# Patient Record
Sex: Female | Born: 1945 | Race: White | Hispanic: No | Marital: Married | Smoking: Never smoker
Health system: Southern US, Community
[De-identification: ages and names within clinical notes are randomized; demographics above are authoritative.]

## PROBLEM LIST (undated history)

## (undated) DIAGNOSIS — R739 Hyperglycemia, unspecified: Secondary | ICD-10-CM

## (undated) DIAGNOSIS — Z8619 Personal history of other infectious and parasitic diseases: Secondary | ICD-10-CM

## (undated) DIAGNOSIS — Z86010 Personal history of colonic polyps: Secondary | ICD-10-CM

## (undated) DIAGNOSIS — K219 Gastro-esophageal reflux disease without esophagitis: Secondary | ICD-10-CM

## (undated) DIAGNOSIS — B079 Viral wart, unspecified: Secondary | ICD-10-CM

## (undated) DIAGNOSIS — Z0000 Encounter for general adult medical examination without abnormal findings: Secondary | ICD-10-CM

## (undated) DIAGNOSIS — T7840XA Allergy, unspecified, initial encounter: Secondary | ICD-10-CM

## (undated) DIAGNOSIS — I10 Essential (primary) hypertension: Secondary | ICD-10-CM

## (undated) DIAGNOSIS — E785 Hyperlipidemia, unspecified: Secondary | ICD-10-CM

## (undated) HISTORY — DX: Hyperlipidemia, unspecified: E78.5

## (undated) HISTORY — DX: Hyperglycemia, unspecified: R73.9

## (undated) HISTORY — DX: Essential (primary) hypertension: I10

## (undated) HISTORY — DX: Viral wart, unspecified: B07.9

## (undated) HISTORY — DX: Encounter for general adult medical examination without abnormal findings: Z00.00

## (undated) HISTORY — DX: Personal history of other infectious and parasitic diseases: Z86.19

## (undated) HISTORY — DX: Personal history of colonic polyps: Z86.010

## (undated) HISTORY — DX: Allergy, unspecified, initial encounter: T78.40XA

## (undated) HISTORY — DX: Gastro-esophageal reflux disease without esophagitis: K21.9

---

## 2007-09-01 DIAGNOSIS — Z860100 Personal history of colon polyps, unspecified: Secondary | ICD-10-CM

## 2007-09-01 HISTORY — DX: Personal history of colonic polyps: Z86.010

## 2007-09-01 HISTORY — DX: Personal history of colon polyps, unspecified: Z86.0100

## 2007-09-01 LAB — HM COLONOSCOPY

## 2008-12-19 LAB — HM COLONOSCOPY

## 2011-06-01 LAB — HM DEXA SCAN

## 2011-09-03 ENCOUNTER — Telehealth: Payer: Self-pay | Admitting: Internal Medicine

## 2011-09-03 ENCOUNTER — Ambulatory Visit (INDEPENDENT_AMBULATORY_CARE_PROVIDER_SITE_OTHER): Payer: 59 | Admitting: Internal Medicine

## 2011-09-03 ENCOUNTER — Encounter: Payer: Self-pay | Admitting: Internal Medicine

## 2011-09-03 DIAGNOSIS — Z23 Encounter for immunization: Secondary | ICD-10-CM

## 2011-09-03 DIAGNOSIS — K635 Polyp of colon: Secondary | ICD-10-CM

## 2011-09-03 DIAGNOSIS — D126 Benign neoplasm of colon, unspecified: Secondary | ICD-10-CM

## 2011-09-03 DIAGNOSIS — Z79899 Other long term (current) drug therapy: Secondary | ICD-10-CM

## 2011-09-03 DIAGNOSIS — I10 Essential (primary) hypertension: Secondary | ICD-10-CM

## 2011-09-03 DIAGNOSIS — M810 Age-related osteoporosis without current pathological fracture: Secondary | ICD-10-CM

## 2011-09-03 DIAGNOSIS — Z1239 Encounter for other screening for malignant neoplasm of breast: Secondary | ICD-10-CM

## 2011-09-03 DIAGNOSIS — E785 Hyperlipidemia, unspecified: Secondary | ICD-10-CM

## 2011-09-03 NOTE — Telephone Encounter (Signed)
Patient has cpe on 12/02/11. Please order labs for one week prior. Patient will be going to [facility] lab

## 2011-09-03 NOTE — Patient Instructions (Signed)
Please sign a record release for your last bone density and other records Schedule your mammogram at your convenience. A computer order has been started. Please schedule labs prior to your next visit (cbc-401.9, chem7-v58.69, lipid/lft-272.4)

## 2011-09-04 NOTE — Telephone Encounter (Signed)
Lab orders entered for April 2013. 

## 2011-09-06 DIAGNOSIS — E785 Hyperlipidemia, unspecified: Secondary | ICD-10-CM | POA: Insufficient documentation

## 2011-09-06 DIAGNOSIS — Z1239 Encounter for other screening for malignant neoplasm of breast: Secondary | ICD-10-CM | POA: Insufficient documentation

## 2011-09-06 DIAGNOSIS — M810 Age-related osteoporosis without current pathological fracture: Secondary | ICD-10-CM | POA: Insufficient documentation

## 2011-09-06 DIAGNOSIS — K635 Polyp of colon: Secondary | ICD-10-CM | POA: Insufficient documentation

## 2011-09-06 DIAGNOSIS — I10 Essential (primary) hypertension: Secondary | ICD-10-CM | POA: Insufficient documentation

## 2011-09-06 NOTE — Assessment & Plan Note (Signed)
Record release for last bmd to review. ? Consider reclast

## 2011-09-06 NOTE — Assessment & Plan Note (Signed)
Schedule screening mammogram. Pap smear with next visit

## 2011-09-06 NOTE — Assessment & Plan Note (Signed)
Defers colonoscopy currently

## 2011-09-06 NOTE — Progress Notes (Signed)
  Subjective:    Patient ID: Michelle Jacobs, female    DOB: 1946-02-04, 66 y.o.   MRN: [id number]  HPI Pt presents to clinic to establish care and follow up of multiple medical problems. States was scheduled for f/u colonoscopy 8/12, underwent prep however procedure was canceled as she was undergoing evaluation for abn ekg. Has not rescheduled colonoscopy and states will wait one year before proceeding. Cardiology evaluation was unremarkable with reportedly neg stress test. H/o hyperlipidemia taking niacin without flushing. BP well controlled with ace inhibitor without cough. Believes underwent bmd 2012 ?osteoporosis despite 2 years of fosamax. No recent fx. Last mammogram and pap smear 4-5 years ago. No other complaints.  Past Medical History  Diagnosis Date  . History of chicken pox   . GERD (gastroesophageal reflux disease)     mild  . Allergy   . Hypertension   . Hyperlipidemia   . Personal history of colonic polyps 2009    Benign colon polyps   History reviewed. No pertinent past surgical history.  reports that she has never smoked. She has never used smokeless tobacco. She reports that she drinks about 6 ounces of alcohol per week. She reports that she does not use illicit drugs. family history includes Cancer in her maternal grandmother and mother; Diabetes in her maternal uncle; Heart disease in her father; and Hypertension in her mother. Allergies  Allergen Reactions  . Iodine Rash  . Sulfa Antibiotics Rash     Review of Systems  Respiratory: Negative for cough and shortness of breath.   Cardiovascular: Negative for chest pain.  All other systems reviewed and are negative.       Objective:   Physical Exam   Physical Exam  Nursing note and vitals reviewed. Constitutional: Appears well-developed and well-nourished. No distress.  HENT:  Head: Normocephalic and atraumatic.  Right Ear: External ear normal.  Left Ear: External ear normal.  Eyes: Conjunctivae are  normal. No scleral icterus.  Neck: Neck supple. Carotid bruit is not present.  Cardiovascular: Normal rate, regular rhythm and normal heart sounds.  Exam reveals no gallop and no friction rub.   No murmur heard. Pulmonary/Chest: Effort normal and breath sounds normal. No respiratory distress. He has no wheezes. no rales.  Lymphadenopathy:    He has no cervical adenopathy.  Neurological:Alert.  Skin: Skin is warm and dry. Not diaphoretic.  Psychiatric: Has a normal mood and affect.       Assessment & Plan:

## 2011-09-06 NOTE — Assessment & Plan Note (Signed)
Normotensive and stable. Continue current regimen. Monitor bp as outpt and followup in clinic as scheduled.  

## 2011-10-12 ENCOUNTER — Telehealth: Payer: Self-pay | Admitting: Internal Medicine

## 2011-10-12 MED ORDER — ALENDRONATE SODIUM 70 MG PO TABS: 70.0000 mg | ORAL_TABLET | ORAL | Status: DC

## 2011-10-12 NOTE — Telephone Encounter (Signed)
[facility]

## 2011-10-12 NOTE — Telephone Encounter (Signed)
Rx refill sent to pharmacy. 

## 2011-12-02 ENCOUNTER — Other Ambulatory Visit (HOSPITAL_COMMUNITY)
Admission: RE | Admit: 2011-12-02 | Discharge: 2011-12-02 | Disposition: A | Discharge status: Home / Self Care | Payer: 59 | Source: Ambulatory Visit | Attending: Internal Medicine | Admitting: Internal Medicine

## 2011-12-02 ENCOUNTER — Telehealth: Payer: Self-pay | Admitting: Internal Medicine

## 2011-12-02 ENCOUNTER — Encounter: Payer: Self-pay | Admitting: Internal Medicine

## 2011-12-02 ENCOUNTER — Ambulatory Visit (HOSPITAL_BASED_OUTPATIENT_CLINIC_OR_DEPARTMENT_OTHER)
Admission: RE | Admit: 2011-12-02 | Discharge: 2011-12-02 | Disposition: A | Discharge status: Home / Self Care | Payer: 59 | Source: Ambulatory Visit | Attending: Internal Medicine | Admitting: Internal Medicine

## 2011-12-02 ENCOUNTER — Ambulatory Visit (INDEPENDENT_AMBULATORY_CARE_PROVIDER_SITE_OTHER): Payer: 59 | Admitting: Internal Medicine

## 2011-12-02 VITALS — BP 114/72 | HR 91 | Temp 97.9°F | Resp 18 | Ht 61.5 in | Wt 152.0 lb

## 2011-12-02 DIAGNOSIS — E785 Hyperlipidemia, unspecified: Secondary | ICD-10-CM

## 2011-12-02 DIAGNOSIS — Z0000 Encounter for general adult medical examination without abnormal findings: Secondary | ICD-10-CM

## 2011-12-02 DIAGNOSIS — Z1231 Encounter for screening mammogram for malignant neoplasm of breast: Secondary | ICD-10-CM | POA: Insufficient documentation

## 2011-12-02 DIAGNOSIS — E039 Hypothyroidism, unspecified: Secondary | ICD-10-CM

## 2011-12-02 DIAGNOSIS — I10 Essential (primary) hypertension: Secondary | ICD-10-CM

## 2011-12-02 DIAGNOSIS — Z79899 Other long term (current) drug therapy: Secondary | ICD-10-CM

## 2011-12-02 DIAGNOSIS — Z01419 Encounter for gynecological examination (general) (routine) without abnormal findings: Secondary | ICD-10-CM | POA: Insufficient documentation

## 2011-12-02 DIAGNOSIS — Z1239 Encounter for other screening for malignant neoplasm of breast: Secondary | ICD-10-CM

## 2011-12-02 DIAGNOSIS — Z124 Encounter for screening for malignant neoplasm of cervix: Secondary | ICD-10-CM

## 2011-12-02 HISTORY — DX: Encounter for general adult medical examination without abnormal findings: Z00.00

## 2011-12-02 LAB — LIPID PANEL
HDL: 57 mg/dL (ref >39)
LDL Cholesterol: 86 mg/dL (ref 0–99)
Total CHOL/HDL Ratio: 3.3 Ratio
VLDL: 45 mg/dL — ABNORMAL HIGH (ref 0–40)

## 2011-12-02 LAB — TSH: TSH: 2.925 u[IU]/mL (ref 0.350–4.500)

## 2011-12-02 LAB — BASIC METABOLIC PANEL
CO2: 30 mEq/L (ref 19–32)
Chloride: 103 mEq/L (ref 96–112)
Glucose, Bld: 102 mg/dL — ABNORMAL HIGH (ref 70–99)
Potassium: 4.7 mEq/L (ref 3.5–5.3)
Sodium: 140 mEq/L (ref 135–145)

## 2011-12-02 LAB — HEPATIC FUNCTION PANEL
AST: 23 U/L (ref 0–37)
Albumin: 4.6 g/dL (ref 3.5–5.2)
Bilirubin, Direct: 0.1 mg/dL (ref 0.0–0.3)
Total Bilirubin: 0.9 mg/dL (ref 0.3–1.2)

## 2011-12-02 LAB — CBC
Hemoglobin: 13.8 g/dL (ref 12.0–15.0)
Platelets: 314 10*3/uL (ref 150–400)
RBC: 4.39 MIL/uL (ref 3.87–5.11)
WBC: 7 10*3/uL (ref 4.0–10.5)

## 2011-12-02 MED ORDER — ALENDRONATE SODIUM 70 MG PO TABS: 70.0000 mg | ORAL_TABLET | ORAL | Status: DC

## 2011-12-02 MED ORDER — LEVOTHYROXINE SODIUM 125 MCG PO TABS: 125.0000 ug | ORAL_TABLET | Freq: Every day | ORAL | Status: DC

## 2011-12-02 MED ORDER — OMEPRAZOLE 20 MG PO CPDR: 20.0000 mg | DELAYED_RELEASE_CAPSULE | Freq: Every day | ORAL | Status: DC

## 2011-12-02 MED ORDER — LISINOPRIL 10 MG PO TABS: 10.0000 mg | ORAL_TABLET | Freq: Every day | ORAL | Status: DC

## 2011-12-02 NOTE — Telephone Encounter (Signed)
Lab orders entered for September 2013. 

## 2011-12-02 NOTE — Patient Instructions (Signed)
Please sign a release form so we can obtain your last bone density results Schedule fasting labs prior to your next appointment: chem7-v58.69, tsh/free t4-hypothyroidism, vitamin d-osteoporosis

## 2011-12-02 NOTE — Progress Notes (Signed)
  Subjective:    Patient ID: Michelle Jacobs, female    DOB: 1945/09/28, 66 y.o.   MRN: [id number]  HPI Pt presents to clinic for annual exam. Last mammogram and pap smear were years ago. H/o osteoporosis on fosamax with ?last outside bmd ?2012.   Past Medical History  Diagnosis Date  . History of chicken pox   . GERD (gastroesophageal reflux disease)     mild  . Allergy   . Hypertension   . Hyperlipidemia   . Personal history of colonic polyps 2009    Benign colon polyps   No past surgical history on file.  reports that she has never smoked. She has never used smokeless tobacco. She reports that she drinks about 6 ounces of alcohol per week. She reports that she does not use illicit drugs. family history includes Cancer in her maternal grandmother and mother; Diabetes in her maternal uncle; Heart disease in her father; and Hypertension in her mother. Allergies  Allergen Reactions  . Iodine Rash  . Sulfa Antibiotics Rash     Review of Systems see hpi     Objective:   Physical Exam  Nursing note and vitals reviewed. Constitutional: She appears well-developed and well-nourished. No distress.  HENT:  Head: Normocephalic and atraumatic.  Right Ear: Tympanic membrane, external ear and ear canal normal.  Left Ear: Tympanic membrane, external ear and ear canal normal.  Nose: Nose normal.  Mouth/Throat: Oropharynx is clear and moist. No oropharyngeal exudate.  Eyes: Conjunctivae and EOM are normal. Pupils are equal, round, and reactive to light. No scleral icterus.  Neck: Neck supple. Carotid bruit is not present. No thyromegaly present.  Cardiovascular: Normal rate, regular rhythm and normal heart sounds.  Exam reveals no gallop and no friction rub.   No murmur heard. Pulmonary/Chest: Effort normal and breath sounds normal. No respiratory distress. She has no wheezes. She has no rales.  Abdominal: Soft. Normal appearance and bowel sounds are normal. She exhibits no distension  and no mass. There is no hepatosplenomegaly. There is no tenderness. There is no rebound and no guarding.  Lymphadenopathy:    She has no cervical adenopathy.  Neurological: She is alert.  Skin: Skin is warm and dry. She is not diaphoretic.  Psychiatric: She has a normal mood and affect.  Gyn: with female nurse escort exam is performed. Ext genitalia nl. Vaginal mucosa and cervix appear nl. Pap smear obtained.  Breast exam: with female nurse escort exam is performed. Axilla without adenopathy. No breast mass noted.         Assessment & Plan:

## 2011-12-02 NOTE — Assessment & Plan Note (Signed)
Nl exam. Obtain cpe labs. Schedule mammogram. Pap smear pending. Request last bmd results

## 2012-01-06 ENCOUNTER — Encounter: Payer: Self-pay | Admitting: Family

## 2012-01-06 ENCOUNTER — Ambulatory Visit (INDEPENDENT_AMBULATORY_CARE_PROVIDER_SITE_OTHER): Payer: 59 | Admitting: Family

## 2012-01-06 DIAGNOSIS — J309 Allergic rhinitis, unspecified: Secondary | ICD-10-CM | POA: Insufficient documentation

## 2012-01-06 MED ORDER — FLUTICASONE PROPIONATE 50 MCG/ACT NA SUSP: 2.0000 | Freq: Every day | NASAL | Status: DC

## 2012-01-06 NOTE — Assessment & Plan Note (Signed)
Will add flonase.  I recommended that she switch to zyrtec or allegra.  Call if fever, sinus pain, worsening throat pain/cough, or if symptoms do not improve.  Pt verbalizes understanding.

## 2012-01-06 NOTE — Progress Notes (Signed)
Subjective:    Patient ID: Michelle Jacobs, female    DOB: [birth year], 66 y.o.   MRN: [id number]  HPI  Ms.  Acklin is a 66 yr old female who presents today with chief complaint of cough.  She reports that cough started Sunday night.   Cough is described as dry/hacking and non-productive. She reports associated throat irritation and clear runny nasal drainage.  She reports associated aching last night which resolved with tylenol.   Takes otc claritin which has not been helping. Review of Systems See HPI  Past Medical History  Diagnosis Date  . History of chicken pox   . GERD (gastroesophageal reflux disease)     mild  . Allergy   . Hypertension   . Hyperlipidemia   . Personal history of colonic polyps 2009    Benign colon polyps    History   Social History  . Marital Status: Married    Spouse Name: N/A    Number of Children: 2  . Years of Education: N/A   Occupational History  .     Social History Main Topics  . Smoking status: Never Smoker   . Smokeless tobacco: Never Used  . Alcohol Use: 6.0 oz/week    10 Glasses of wine per week  . Drug Use: No  . Sexually Active: Not on file   Other Topics Concern  . Not on file   Social History Narrative   Caffeine use:  2-3 cups coffee dailyRegular exercise:  Walks daily    No past surgical history on file.  Family History  Problem Relation Age of Onset  . Cancer Mother     lung  . Hypertension Mother   . Heart disease Father   . Diabetes Maternal Uncle     type II  . Cancer Maternal Grandmother     breast    Allergies  Allergen Reactions  . Iodine Rash  . Sulfa Antibiotics Rash    Current Outpatient Prescriptions on File Prior to Visit  Medication Sig Dispense Refill  . alendronate (FOSAMAX) 70 MG tablet Take 1 tablet (70 mg total) by mouth every 7 (seven) days. Take with a full glass of water on an empty stomach.  4 tablet  6  . aspirin EC 81 MG tablet Take 81 mg by mouth daily.        . Calcium  Carbonate-Vitamin D (CALCIUM-D) 600-400 MG-UNIT TABS Take 3 tablets by mouth daily.        . Cholecalciferol (VITAMIN D) 2000 UNITS CAPS Take 1 capsule by mouth daily.        Marland Kitchen levothyroxine (SYNTHROID, LEVOTHROID) 125 MCG tablet Take 1 tablet (125 mcg total) by mouth daily.  30 tablet  6  . lisinopril (PRINIVIL,ZESTRIL) 10 MG tablet Take 1 tablet (10 mg total) by mouth daily.  30 tablet  6  . loratadine (CLARITIN) 10 MG tablet Take 10 mg by mouth daily.        . magnesium oxide (MAG-OX) 400 MG tablet Take 400 mg by mouth 2 (two) times daily.        . Multiple Vitamin (MULTIVITAMIN) tablet Take 1 tablet by mouth daily.        . niacin 500 MG tablet Take 500 mg by mouth daily.        . Omega-3 Fatty Acids (FISH OIL) 1200 MG CAPS Take 4 capsules by mouth daily.        Marland Kitchen omeprazole (PRILOSEC) 20 MG capsule Take 1 capsule (20  mg total) by mouth daily.  30 capsule  6  . Red Yeast Rice 600 MG CAPS Take 1 capsule by mouth 2 (two) times daily.        . fluticasone (FLONASE) 50 MCG/ACT nasal spray Place 2 sprays into the nose daily.  16 g  3    BP 100/80  Pulse 73  Temp(Src) 98.3 F (36.8 C) (Oral)  Resp 16  Wt 152 lb 0.6 oz (68.965 kg)  SpO2 99%       Objective:   Physical Exam  Constitutional: She appears well-developed and well-nourished. No distress.  HENT:  Head: Normocephalic and atraumatic.  Right Ear: Tympanic membrane and ear canal normal.  Left Ear: Tympanic membrane and ear canal normal.       Mild post oropharyngeal erythema without exudates.  Eyes: Conjunctivae are normal. Pupils are equal, round, and reactive to light.  Cardiovascular: Normal rate and regular rhythm.   No murmur heard. Pulmonary/Chest: Effort normal and breath sounds normal. No respiratory distress. She has no wheezes. She has no rales. She exhibits no tenderness.  Psychiatric: She has a normal mood and affect. Her speech is normal and behavior is normal.          Assessment & Plan:

## 2012-01-06 NOTE — Patient Instructions (Signed)
Allergic Rhinitis  Allergic rhinitis is when the mucous membranes in the nose respond to allergens. Allergens are particles in the air that cause your body to have an allergic reaction. This causes you to release allergic antibodies. Through a chain of events, these eventually cause you to release histamine into the blood stream (hence the use of antihistamines). Although meant to be protective to the body, it is this release that causes your discomfort, such as frequent sneezing, congestion and an itchy runny nose.    CAUSES    The pollen allergens may come from grasses, trees, and weeds. This is seasonal allergic rhinitis, or "hay fever." Other allergens cause year-round allergic rhinitis (perennial allergic rhinitis) such as house dust mite allergen, pet dander and mold spores.    SYMPTOMS     Nasal stuffiness (congestion).   Runny, itchy nose with sneezing and tearing of the eyes.   There is often an itching of the mouth, eyes and ears.  It cannot be cured, but it can be controlled with medications.  DIAGNOSIS    If you are unable to determine the offending allergen, skin or blood testing may find it.  TREATMENT     Avoid the allergen.   Medications and allergy shots (immunotherapy) can help.   Hay fever may often be treated with antihistamines in pill or nasal spray forms. Antihistamines block the effects of histamine. There are over-the-counter medicines that may help with nasal congestion and swelling around the eyes. Check with your caregiver before taking or giving this medicine.  If the treatment above does not work, there are many new medications your caregiver can prescribe. Stronger medications may be used if initial measures are ineffective. Desensitizing injections can be used if medications and avoidance fails. Desensitization is when a patient is given ongoing shots until the body becomes less sensitive to the allergen. Make sure you follow up with your caregiver if problems continue.  SEEK  MEDICAL CARE IF:     You develop fever (more than 100.5 F (38.1 C).   You develop a cough that does not stop easily (persistent).   You have shortness of breath.   You start wheezing.   Symptoms interfere with normal daily activities.  Document Released: 05/12/2001 Document Revised: 08/06/2011 Document Reviewed: 11/21/2008  [employer] Patient Information 2012 [employer].

## 2012-01-07 ENCOUNTER — Telehealth: Payer: Self-pay | Admitting: *Deleted

## 2012-01-07 MED ORDER — AMOXICILLIN-POT CLAVULANATE 875-125 MG PO TABS: 1.0000 | ORAL_TABLET | Freq: Two times a day (BID) | ORAL | Status: AC

## 2012-01-07 NOTE — Telephone Encounter (Signed)
Received message from pt that she feels significantly worse today. Reports that her nasal discharge is now green, has been running fever. Pt requests antibiotic be sent to her pharmacy. Left message for pt to return my call.

## 2012-01-07 NOTE — Telephone Encounter (Signed)
rx sent for augmentin. Call if symptoms worsen or do not improve.

## 2012-01-07 NOTE — Telephone Encounter (Signed)
Pt returned my call and was notified of instructions below. 

## 2012-01-26 ENCOUNTER — Telehealth: Payer: Self-pay | Admitting: *Deleted

## 2012-01-26 NOTE — Telephone Encounter (Signed)
I would like to see her back in th office for re-evaluation please.

## 2012-01-26 NOTE — Telephone Encounter (Signed)
Notified pt and scheduled f/u for tomorrow at 3:15pm.

## 2012-01-26 NOTE — Telephone Encounter (Signed)
Pt left message that her sinus symptoms returned about 1 week after completing her antibiotic. Has had fever, drainage (green in color) and sore throat. Wants to know if we will call in another round of antibiotic? Please advise.

## 2012-01-27 ENCOUNTER — Encounter: Payer: Self-pay | Admitting: Family

## 2012-01-27 ENCOUNTER — Ambulatory Visit (INDEPENDENT_AMBULATORY_CARE_PROVIDER_SITE_OTHER): Payer: 59 | Admitting: Family

## 2012-01-27 VITALS — BP 108/70 | HR 66 | Temp 97.8°F | Resp 16 | Wt 150.1 lb

## 2012-01-27 DIAGNOSIS — J029 Acute pharyngitis, unspecified: Secondary | ICD-10-CM

## 2012-01-27 DIAGNOSIS — J329 Chronic sinusitis, unspecified: Secondary | ICD-10-CM | POA: Insufficient documentation

## 2012-01-27 LAB — POCT RAPID STREP A (OFFICE): Rapid Strep A Screen: NEGATIVE

## 2012-01-27 MED ORDER — CEFUROXIME AXETIL 500 MG PO TABS: 500.0000 mg | ORAL_TABLET | Freq: Two times a day (BID) | ORAL | Status: AC

## 2012-01-27 NOTE — Assessment & Plan Note (Signed)
Rapid strep neg. Suspect recurrent sinusitis- causing throat irritation and cough.  Will rx with ceftin, call if symptoms worsen or if no improvement in 2-3 days.

## 2012-01-27 NOTE — Patient Instructions (Signed)
Please call if symptoms worsen, or if you are not feeling better in 2-3 days.  

## 2012-01-27 NOTE — Progress Notes (Signed)
Subjective:    Patient ID: Michelle Jacobs, female    DOB: 1946/04/15, 66 y.o.   MRN: [id number]  HPI  Ms.  Williardis a 66 yr old female who presents today with chief complaint of sore throat.  She was treated reated 2.5 weeks ago for sinusitis with augmentin.  Got beter initially, but then started to "feel bad again." Now with cough, sore throat, sweating at night.  Using mucinex with some improvement.  Cough is sporadic. Sputum is thick and green.  Sore throat x 10 days.  Worse in the mornings.  + post-nasal drip- yellow nasal discharge. Continues flonase and antihistamine.     Review of Systems    see HPI  Past Medical History  Diagnosis Date  . History of chicken pox   . GERD (gastroesophageal reflux disease)     mild  . Allergy   . Hypertension   . Hyperlipidemia   . Personal history of colonic polyps 2009    Benign colon polyps    History   Social History  . Marital Status: Married    Spouse Name: N/A    Number of Children: 2  . Years of Education: N/A   Occupational History  .     Social History Main Topics  . Smoking status: Never Smoker   . Smokeless tobacco: Never Used  . Alcohol Use: 6.0 oz/week    10 Glasses of wine per week  . Drug Use: No  . Sexually Active: Not on file   Other Topics Concern  . Not on file   Social History Narrative   Caffeine use:  2-3 cups coffee dailyRegular exercise:  Walks daily    No past surgical history on file.  Family History  Problem Relation Age of Onset  . Cancer Mother     lung  . Hypertension Mother   . Heart disease Father   . Diabetes Maternal Uncle     type II  . Cancer Maternal Grandmother     breast    Allergies  Allergen Reactions  . Iodine Rash  . Sulfa Antibiotics Rash    Current Outpatient Prescriptions on File Prior to Visit  Medication Sig Dispense Refill  . alendronate (FOSAMAX) 70 MG tablet Take 1 tablet (70 mg total) by mouth every 7 (seven) days. Take with a full glass of water  on an empty stomach.  4 tablet  6  . aspirin EC 81 MG tablet Take 81 mg by mouth daily.        . Calcium Carbonate-Vitamin D (CALCIUM-D) 600-400 MG-UNIT TABS Take 3 tablets by mouth daily.        . Cholecalciferol (VITAMIN D) 2000 UNITS CAPS Take 1 capsule by mouth daily.        . fluticasone (FLONASE) 50 MCG/ACT nasal spray Place 2 sprays into the nose daily.  16 g  3  . levothyroxine (SYNTHROID, LEVOTHROID) 125 MCG tablet Take 1 tablet (125 mcg total) by mouth daily.  30 tablet  6  . lisinopril (PRINIVIL,ZESTRIL) 10 MG tablet Take 1 tablet (10 mg total) by mouth daily.  30 tablet  6  . loratadine (CLARITIN) 10 MG tablet Take 10 mg by mouth daily.        . magnesium oxide (MAG-OX) 400 MG tablet Take 400 mg by mouth 2 (two) times daily.        . Multiple Vitamin (MULTIVITAMIN) tablet Take 1 tablet by mouth daily.        . niacin 500  MG tablet Take 500 mg by mouth daily.        . Omega-3 Fatty Acids (FISH OIL) 1200 MG CAPS Take 4 capsules by mouth daily.        Marland Kitchen omeprazole (PRILOSEC) 20 MG capsule Take 1 capsule (20 mg total) by mouth daily.  30 capsule  6  . Red Yeast Rice 600 MG CAPS Take 1 capsule by mouth 2 (two) times daily.          BP 108/70  Pulse 66  Temp(Src) 97.8 F (36.6 C) (Oral)  Resp 16  Wt 150 lb 1.3 oz (68.076 kg)  SpO2 98%    Objective:   Physical Exam  Constitutional: She appears well-developed and well-nourished. No distress.  HENT:  Head: Normocephalic and atraumatic.  Right Ear: Tympanic membrane and ear canal normal.  Left Ear: Tympanic membrane and ear canal normal.  Mouth/Throat: Posterior oropharyngeal erythema present. No posterior oropharyngeal edema.  Cardiovascular: Normal rate and regular rhythm.   No murmur heard. Pulmonary/Chest: Effort normal and breath sounds normal. No respiratory distress. She has no wheezes. She has no rales. She exhibits no tenderness.  Lymphadenopathy:    She has no cervical adenopathy.  Psychiatric: She has a normal mood  and affect. Her behavior is normal. Judgment and thought content normal.          Assessment & Plan:

## 2012-05-23 ENCOUNTER — Encounter: Payer: Self-pay | Admitting: Internal Medicine

## 2012-05-23 ENCOUNTER — Ambulatory Visit (INDEPENDENT_AMBULATORY_CARE_PROVIDER_SITE_OTHER): Payer: Medicare Other | Admitting: Internal Medicine

## 2012-05-23 VITALS — BP 116/68 | HR 73 | Temp 98.7°F | Resp 16 | Wt 151.5 lb

## 2012-05-23 DIAGNOSIS — J069 Acute upper respiratory infection, unspecified: Secondary | ICD-10-CM

## 2012-05-23 MED ORDER — AZITHROMYCIN 250 MG PO TABS: ORAL_TABLET | ORAL | Status: DC

## 2012-05-23 NOTE — Assessment & Plan Note (Signed)
Continue over-the-counter symptomatic treatment when necessary. Give antibiotic to hold. Begin antibiotic if no improvement of symptoms after total duration of 8-10 days. Follow up if no improvement or worsening.

## 2012-05-23 NOTE — Progress Notes (Signed)
  Subjective:    Patient ID: Michelle Jacobs, female    DOB: Dec 14, 1945, 66 y.o.   MRN: [id number]  HPI patient presents to clinic for evaluation of cough. Notes sinus pain pressure, subjective fever, sore throat and cough productive for green sputum. Durations approximately 3 days. No hemoptysis. No alleviating or exacerbating factors. Is taking over-the-counter Mucinex. No known sick exposures. No other complaints.  Past Medical History  Diagnosis Date  . History of chicken pox   . GERD (gastroesophageal reflux disease)     mild  . Allergy   . Hypertension   . Hyperlipidemia   . Personal history of colonic polyps 2009    Benign colon polyps   No past surgical history on file.  reports that she has never smoked. She has never used smokeless tobacco. She reports that she drinks about 6 ounces of alcohol per week. She reports that she does not use illicit drugs. family history includes Cancer in her maternal grandmother and mother; Diabetes in her maternal uncle; Heart disease in her father; and Hypertension in her mother. Allergies  Allergen Reactions  . Iodine Rash  . Sulfa Antibiotics Rash     Review of Systems see hpi     Objective:   Physical Exam  Nursing note and vitals reviewed. Constitutional: She appears well-developed and well-nourished. No distress.  HENT:  Head: Normocephalic and atraumatic.  Right Ear: Tympanic membrane, external ear and ear canal normal.  Left Ear: Tympanic membrane, external ear and ear canal normal.  Nose: Nose normal.  Mouth/Throat: Oropharynx is clear and moist. No oropharyngeal exudate.  Eyes: Conjunctivae normal are normal. No scleral icterus.  Neck: Neck supple.  Pulmonary/Chest: Effort normal and breath sounds normal. No respiratory distress. She has no wheezes. She has no rales.  Lymphadenopathy:    She has no cervical adenopathy.  Neurological: She is alert.  Skin: Skin is warm and dry. She is not diaphoretic.  Psychiatric: She  has a normal mood and affect.          Assessment & Plan:

## 2012-05-30 ENCOUNTER — Encounter: Payer: Self-pay | Admitting: Internal Medicine

## 2012-05-30 ENCOUNTER — Ambulatory Visit (INDEPENDENT_AMBULATORY_CARE_PROVIDER_SITE_OTHER): Payer: Medicare Other | Admitting: Internal Medicine

## 2012-05-30 VITALS — BP 126/84 | HR 58 | Temp 97.8°F | Resp 14 | Wt 152.2 lb

## 2012-05-30 DIAGNOSIS — I10 Essential (primary) hypertension: Secondary | ICD-10-CM

## 2012-05-30 DIAGNOSIS — J069 Acute upper respiratory infection, unspecified: Secondary | ICD-10-CM

## 2012-05-30 DIAGNOSIS — E039 Hypothyroidism, unspecified: Secondary | ICD-10-CM

## 2012-05-30 DIAGNOSIS — Z23 Encounter for immunization: Secondary | ICD-10-CM

## 2012-05-30 LAB — BASIC METABOLIC PANEL
CO2: 28 mEq/L (ref 19–32)
Calcium: 10.2 mg/dL (ref 8.4–10.5)
Chloride: 104 mEq/L (ref 96–112)
Glucose, Bld: 93 mg/dL (ref 70–99)
Sodium: 140 mEq/L (ref 135–145)

## 2012-05-30 MED ORDER — AZITHROMYCIN 250 MG PO TABS: ORAL_TABLET | ORAL | Status: DC

## 2012-05-30 NOTE — Telephone Encounter (Signed)
Lab orders released/SLS 

## 2012-05-30 NOTE — Progress Notes (Signed)
  Subjective:    Patient ID: Michelle Jacobs, female    DOB: May 18, 1946, 66 y.o.   MRN: [id number]  HPI Pt presents to clinic for followup of multiple medical problems. Recently completed course of antibiotics without side effects. Feels 50% improved has persistent cough. No fever or chills. Blood pressure reviewed as normal tensive. Weight stable. Reviewed outside bone density with T. scores suggestive of osteopenia dated March 2012. Reviewed appropriate calcium and vitamin D dosing. Request printed prescription for Zostavax  Past Medical History  Diagnosis Date  . History of chicken pox   . GERD (gastroesophageal reflux disease)     mild  . Allergy   . Hypertension   . Hyperlipidemia   . Personal history of colonic polyps 2009    Benign colon polyps   No past surgical history on file.  reports that she has never smoked. She has never used smokeless tobacco. She reports that she drinks about 6 ounces of alcohol per week. She reports that she does not use illicit drugs. family history includes Cancer in her maternal grandmother and mother; Diabetes in her maternal uncle; Heart disease in her father; and Hypertension in her mother. Allergies  Allergen Reactions  . Iodine Rash  . Sulfa Antibiotics Rash      Review of Systems see hpi     Objective:   Physical Exam  Physical Exam  Nursing note and vitals reviewed. Constitutional: Appears well-developed and well-nourished. No distress.  HENT:  Head: Normocephalic and atraumatic.  Right Ear: External ear normal.  Left Ear: External ear normal.  Eyes: Conjunctivae are normal. No scleral icterus.  Neck: Neck supple. Carotid bruit is not present.  Cardiovascular: Normal rate, regular rhythm and normal heart sounds.  Exam reveals no gallop and no friction rub.   No murmur heard. Pulmonary/Chest: Effort normal and breath sounds normal. No respiratory distress. He has no wheezes. no rales.  Lymphadenopathy:    He has no cervical  adenopathy.  Neurological:Alert.  Skin: Skin is warm and dry. Not diaphoretic.  Psychiatric: Has a normal mood and affect.        Assessment & Plan:

## 2012-05-30 NOTE — Addendum Note (Signed)
Addended by: Regis Bill on: 05/30/2012 09:05 AM   Modules accepted: Orders

## 2012-05-30 NOTE — Assessment & Plan Note (Signed)
Normotensive and stable. Continue current regimen. Monitor bp as outpt and followup in clinic as scheduled.  

## 2012-05-30 NOTE — Patient Instructions (Signed)
Please schedule fasting labs prior to next visit Cbc-401.9, chem7-v58.69, lipid/lft-272.4 and vit d -osteopenia

## 2012-05-30 NOTE — Assessment & Plan Note (Signed)
Likely bronchitis. Repeat antibiotic course. Followup if no improvement or worsening.

## 2012-05-30 NOTE — Assessment & Plan Note (Signed)
Obtain TSH and free T4 

## 2012-06-07 ENCOUNTER — Telehealth: Payer: Self-pay | Admitting: Internal Medicine

## 2012-06-07 MED ORDER — ALENDRONATE SODIUM 70 MG PO TABS: 70.0000 mg | ORAL_TABLET | ORAL | Status: DC

## 2012-06-07 NOTE — Telephone Encounter (Signed)
Refill- alendronate(fosamax) 70mg  tablet. Take one tablet (70mg  total) by mouth every seven days. Take with a full glass of water on an empty stomach. Qty 4 last fill 9.10.13

## 2012-06-16 ENCOUNTER — Telehealth: Payer: Self-pay | Admitting: *Deleted

## 2012-06-16 NOTE — Telephone Encounter (Signed)
Pt left message requesting Rx for prilosec. Pt states she discussed this at last office visit and she feels she needs to start medication. Please advise.

## 2012-06-17 MED ORDER — OMEPRAZOLE 40 MG PO CPDR: 40.0000 mg | DELAYED_RELEASE_CAPSULE | Freq: Every day | ORAL | Status: DC

## 2012-06-17 NOTE — Telephone Encounter (Signed)
Omeprazole 40mg  qd #30 rf6

## 2012-06-17 NOTE — Telephone Encounter (Signed)
Rx sent, pt notified. 

## 2012-06-22 ENCOUNTER — Encounter: Payer: Self-pay | Admitting: Family

## 2012-06-22 ENCOUNTER — Telehealth: Payer: Self-pay | Admitting: Internal Medicine

## 2012-06-22 ENCOUNTER — Ambulatory Visit (INDEPENDENT_AMBULATORY_CARE_PROVIDER_SITE_OTHER): Payer: Medicare Other | Admitting: Family

## 2012-06-22 VITALS — BP 104/74 | HR 82 | Temp 99.5°F | Resp 16 | Wt 153.0 lb

## 2012-06-22 DIAGNOSIS — J029 Acute pharyngitis, unspecified: Secondary | ICD-10-CM

## 2012-06-22 LAB — POCT RAPID STREP A (OFFICE): Rapid Strep A Screen: NEGATIVE

## 2012-06-22 MED ORDER — AMOXICILLIN-POT CLAVULANATE 875-125 MG PO TABS: 1.0000 | ORAL_TABLET | Freq: Two times a day (BID) | ORAL | Status: DC

## 2012-06-22 NOTE — Patient Instructions (Addendum)
Please call if symptoms worsen or if no improvement in 2-3 days.  

## 2012-06-22 NOTE — Telephone Encounter (Signed)
Caller: Ashari/Patient; Patient Name: Michelle Jacobs; PCP: Marguarite Arbour (Adults only); Best Callback Phone Number: [phone]  Patient states she developed generalized aching, sore throat, cough, headache, fatigue, loss of appetite, low grade fever per tactile. Onset X 1 week. States has pressure in frontal area of head.  Patient taking fluids well. Urinating normally for patient. Triage per URI Protocol. No emergent sx identified. Care advice given per guidelines related to positive triage assessment for " Facial Pain and any temperature elevation." Patietn advised netty pot, inhaled steam, warm compresses to face, humidifier, increased fluids, warm fluids with honey. Call back parameters reviewed. Patient verbalizes understanding. Appt. scheduled within 24 hours, per protocol. Appt. scheduled for 1600 06/22/12 with Sandford Craze NP.

## 2012-06-22 NOTE — Progress Notes (Signed)
Subjective:    Patient ID: Michelle Jacobs, female    DOB: 1945/12/13, 66 y.o.   MRN: [id number]  HPI  Pt reports scratchy throat, dry cough, headache and upper back pain. Present x 1 week.  + Aching.  +low grade temp.  + sinus pressure, minimal sinus drainage. Using flonase at bedtime which has helped some. Her niece who lives with her was diagnosed with strep.   Review of Systems see HPI    Past Medical History  Diagnosis Date  . History of chicken pox   . GERD (gastroesophageal reflux disease)     mild  . Allergy   . Hypertension   . Hyperlipidemia   . Personal history of colonic polyps 2009    Benign colon polyps    History   Social History  . Marital Status: Married    Spouse Name: N/A    Number of Children: 2  . Years of Education: N/A   Occupational History  .     Social History Main Topics  . Smoking status: Never Smoker   . Smokeless tobacco: Never Used  . Alcohol Use: 6.0 oz/week    10 Glasses of wine per week  . Drug Use: No  . Sexually Active: Not on file   Other Topics Concern  . Not on file   Social History Narrative   Caffeine use:  2-3 cups coffee dailyRegular exercise:  Walks daily    No past surgical history on file.  Family History  Problem Relation Age of Onset  . Cancer Mother     lung  . Hypertension Mother   . Heart disease Father   . Diabetes Maternal Uncle     type II  . Cancer Maternal Grandmother     breast    Allergies  Allergen Reactions  . Iodine Rash  . Mercurochrome (Merbromin) Rash  . Sulfa Antibiotics Rash    Current Outpatient Prescriptions on File Prior to Visit  Medication Sig Dispense Refill  . alendronate (FOSAMAX) 70 MG tablet Take 1 tablet (70 mg total) by mouth every 7 (seven) days. Take with a full glass of water on an empty stomach.  4 tablet  6  . aspirin EC 81 MG tablet Take 81 mg by mouth daily.        . Calcium Carbonate-Vitamin D (CALCIUM-D) 600-400 MG-UNIT TABS Take 1 tablet by mouth  daily.       . Cholecalciferol (VITAMIN D) 2000 UNITS CAPS Take 1 capsule by mouth daily.        . fexofenadine (ALLEGRA) 180 MG tablet Take 180 mg by mouth daily.      . fluticasone (FLONASE) 50 MCG/ACT nasal spray Place 2 sprays into the nose daily.  16 g  3  . levothyroxine (SYNTHROID, LEVOTHROID) 125 MCG tablet Take 1 tablet (125 mcg total) by mouth daily.  30 tablet  6  . lisinopril (PRINIVIL,ZESTRIL) 10 MG tablet Take 1 tablet (10 mg total) by mouth daily.  30 tablet  6  . loratadine (CLARITIN) 10 MG tablet Take 10 mg by mouth daily.        . Multiple Vitamin (MULTIVITAMIN) tablet Take 1 tablet by mouth daily.        . Omega-3 Fatty Acids (FISH OIL) 1200 MG CAPS Take 4 capsules by mouth daily.        Marland Kitchen omeprazole (PRILOSEC) 40 MG capsule Take 1 capsule (40 mg total) by mouth daily.  30 capsule  6  . azithromycin (ZITHROMAX)  250 MG tablet As directed  6 tablet  0  . magnesium oxide (MAG-OX) 400 MG tablet Take 400 mg by mouth 2 (two) times daily.        . niacin 500 MG tablet Take 500 mg by mouth daily.        . Red Yeast Rice 600 MG CAPS Take 1 capsule by mouth 2 (two) times daily.          BP 104/74  Pulse 82  Temp 99.5 F (37.5 C) (Oral)  Resp 16  Wt 153 lb (69.4 kg)  SpO2 98%    Objective:   Physical Exam  Constitutional: She appears well-developed and well-nourished. No distress.  HENT:  Head: Normocephalic and atraumatic.  Right Ear: Tympanic membrane and ear canal normal.  Left Ear: Tympanic membrane and ear canal normal.  Mouth/Throat: Posterior oropharyngeal erythema present. No oropharyngeal exudate or posterior oropharyngeal edema.  Cardiovascular: Normal rate and regular rhythm.   No murmur heard. Pulmonary/Chest: Effort normal and breath sounds normal. No respiratory distress. She has no wheezes. She has no rales. She exhibits no tenderness.  Lymphadenopathy:    She has no cervical adenopathy.  Skin: Skin is warm and dry.  Psychiatric: She has a normal mood  and affect. Her behavior is normal. Judgment and thought content normal.          Assessment & Plan:  Pharyngitis- rapid strep is negative, but pt has low grade temp and known exposure.  Plan empiric rx with augmentin.

## 2012-06-27 ENCOUNTER — Ambulatory Visit (INDEPENDENT_AMBULATORY_CARE_PROVIDER_SITE_OTHER): Payer: Medicare Other | Admitting: Internal Medicine

## 2012-06-27 ENCOUNTER — Encounter: Payer: Self-pay | Admitting: Internal Medicine

## 2012-06-27 ENCOUNTER — Ambulatory Visit (HOSPITAL_BASED_OUTPATIENT_CLINIC_OR_DEPARTMENT_OTHER)
Admission: RE | Admit: 2012-06-27 | Discharge: 2012-06-27 | Disposition: A | Discharge status: Home / Self Care | Payer: Medicare Other | Source: Ambulatory Visit | Attending: Internal Medicine | Admitting: Internal Medicine

## 2012-06-27 VITALS — BP 114/68 | HR 88 | Temp 98.1°F | Resp 12 | Wt 152.1 lb

## 2012-06-27 DIAGNOSIS — R05 Cough: Secondary | ICD-10-CM

## 2012-06-27 DIAGNOSIS — R059 Cough, unspecified: Secondary | ICD-10-CM | POA: Insufficient documentation

## 2012-06-27 DIAGNOSIS — J40 Bronchitis, not specified as acute or chronic: Secondary | ICD-10-CM | POA: Insufficient documentation

## 2012-06-27 MED ORDER — LEVOFLOXACIN 500 MG PO TABS: 500.0000 mg | ORAL_TABLET | Freq: Every day | ORAL | Status: DC

## 2012-06-27 NOTE — Assessment & Plan Note (Signed)
Begin antibiotic therapy. Obtain chest x-ray.Followup if no improvement or worsening. 

## 2012-06-27 NOTE — Progress Notes (Signed)
  Subjective:    Patient ID: Michelle Jacobs, female    DOB: 1946-06-08, 66 y.o.   MRN: [id number]  HPI Pt presents to clinic for evaluation of upper respiratory infection. Notes over one week history of cough, nasal congestion in general malaise. Cough improved at night Flonase. No other alleviating or exacerbating factors. No other complaints.  Past Medical History  Diagnosis Date  . History of chicken pox   . GERD (gastroesophageal reflux disease)     mild  . Allergy   . Hypertension   . Hyperlipidemia   . Personal history of colonic polyps 2009    Benign colon polyps   No past surgical history on file.  reports that she has never smoked. She has never used smokeless tobacco. She reports that she drinks about 6 ounces of alcohol per week. She reports that she does not use illicit drugs. family history includes Cancer in her maternal grandmother and mother; Diabetes in her maternal uncle; Heart disease in her father; and Hypertension in her mother. Allergies  Allergen Reactions  . Iodine Rash  . Mercurochrome (Merbromin) Rash  . Sulfa Antibiotics Rash     Review of Systems see hpi     Objective:   Physical Exam  Nursing note and vitals reviewed. Constitutional: She appears well-developed and well-nourished. No distress.  HENT:  Head: Normocephalic and atraumatic.  Right Ear: Tympanic membrane, external ear and ear canal normal.  Left Ear: Tympanic membrane, external ear and ear canal normal.  Nose: Nose normal.  Mouth/Throat: Oropharynx is clear and moist. No oropharyngeal exudate.  Eyes: Conjunctivae normal are normal. No scleral icterus.  Neck: Neck supple.  Cardiovascular: Normal rate, regular rhythm and normal heart sounds.  Exam reveals no gallop and no friction rub.   No murmur heard. Pulmonary/Chest: Effort normal and breath sounds normal. No respiratory distress. She has no wheezes. She has no rales.  Neurological: She is alert.  Skin: Skin is warm and dry.  She is not diaphoretic.  Psychiatric: She has a normal mood and affect.          Assessment & Plan:

## 2012-07-15 ENCOUNTER — Telehealth: Payer: Self-pay | Admitting: Internal Medicine

## 2012-07-15 MED ORDER — OMEPRAZOLE 40 MG PO CPDR: 40.0000 mg | DELAYED_RELEASE_CAPSULE | Freq: Every day | ORAL | Status: DC

## 2012-07-15 MED ORDER — ALENDRONATE SODIUM 70 MG PO TABS: 70.0000 mg | ORAL_TABLET | ORAL | Status: DC

## 2012-07-15 MED ORDER — LISINOPRIL 10 MG PO TABS: 10.0000 mg | ORAL_TABLET | Freq: Every day | ORAL | Status: DC

## 2012-07-15 MED ORDER — LEVOTHYROXINE SODIUM 125 MCG PO TABS: 125.0000 ug | ORAL_TABLET | Freq: Every day | ORAL | Status: DC

## 2012-07-15 NOTE — Telephone Encounter (Signed)
Rx[s] request to new [facility]; [facility] to inform patient/SLS

## 2012-07-15 NOTE — Telephone Encounter (Signed)
Patient states that she has new insurance and is using a new mail order pharmacy. She is now using [facility]. She would like a 90 day supply of each medication to be sent to [facility].  Alendronate  Levothyroxine  Lisinopril  Omeprazole

## 2012-11-21 ENCOUNTER — Encounter: Payer: Self-pay | Admitting: Family Medicine

## 2012-11-21 ENCOUNTER — Ambulatory Visit (INDEPENDENT_AMBULATORY_CARE_PROVIDER_SITE_OTHER): Payer: Medicare Other | Admitting: Family Medicine

## 2012-11-21 VITALS — BP 120/74 | HR 65 | Temp 98.7°F | Ht 61.5 in | Wt 151.0 lb

## 2012-11-21 DIAGNOSIS — J309 Allergic rhinitis, unspecified: Secondary | ICD-10-CM

## 2012-11-21 DIAGNOSIS — E039 Hypothyroidism, unspecified: Secondary | ICD-10-CM

## 2012-11-21 DIAGNOSIS — E785 Hyperlipidemia, unspecified: Secondary | ICD-10-CM

## 2012-11-21 DIAGNOSIS — I10 Essential (primary) hypertension: Secondary | ICD-10-CM

## 2012-11-21 DIAGNOSIS — T7840XA Allergy, unspecified, initial encounter: Secondary | ICD-10-CM

## 2012-11-21 MED ORDER — FLUTICASONE PROPIONATE 50 MCG/ACT NA SUSP: 2.0000 | Freq: Every day | NASAL | Status: DC | PRN

## 2012-11-21 NOTE — Assessment & Plan Note (Signed)
Well-controlled on current meds 

## 2012-11-21 NOTE — Patient Instructions (Addendum)
Probiotic such as Digestive Advantage daily   Labs prior to next visit, vitamin D, liver, tsh, cbc, renal, lipid  Next Visit Annual Add MegaRed krill oil caps to replace fish oil caps Gastroesophageal Reflux Disease, Adult Gastroesophageal reflux disease (GERD) happens when acid from your stomach flows up into the esophagus. When acid comes in contact with the esophagus, the acid causes  examsoreness (inflammation) in the esophagus. Over time, GERD may create small holes (ulcers) in the lining of the esophagus. CAUSES   Increased body weight. This puts pressure on the stomach, making acid rise from the stomach into the esophagus.  Smoking. This increases acid production in the stomach.  Drinking alcohol. This causes decreased pressure in the lower esophageal sphincter (valve or ring of muscle between the esophagus and stomach), allowing acid from the stomach into the esophagus.  Late evening meals and a full stomach. This increases pressure and acid production in the stomach.  A malformed lower esophageal sphincter. Sometimes, no cause is found. SYMPTOMS   Burning pain in the lower part of the mid-chest behind the breastbone and in the mid-stomach area. This may occur twice a week or more often.  Trouble swallowing.  Sore throat.  Dry cough.  Asthma-like symptoms including chest tightness, shortness of breath, or wheezing. DIAGNOSIS  Your caregiver may be able to diagnose GERD based on your symptoms. In some cases, X-rays and other tests may be done to check for complications or to check the condition of your stomach and esophagus. TREATMENT  Your caregiver may recommend over-the-counter or prescription medicines to help decrease acid production. Ask your caregiver before starting or adding any new medicines.  HOME CARE INSTRUCTIONS   Change the factors that you can control. Ask your caregiver for guidance concerning weight loss, quitting smoking, and alcohol  consumption.  Avoid foods and drinks that make your symptoms worse, such as:  Caffeine or alcoholic drinks.  Chocolate.  Peppermint or mint flavorings.  Garlic and onions.  Spicy foods.  Citrus fruits, such as oranges, lemons, or limes.  Tomato-based foods such as sauce, chili, salsa, and pizza.  Fried and fatty foods.  Avoid lying down for the 3 hours prior to your bedtime or prior to taking a nap.  Eat small, frequent meals instead of large meals.  Wear loose-fitting clothing. Do not wear anything tight around your waist that causes pressure on your stomach.  Raise the head of your bed 6 to 8 inches with wood blocks to help you sleep. Extra pillows will not help.  Only take over-the-counter or prescription medicines for pain, discomfort, or fever as directed by your caregiver.  Do not take aspirin, ibuprofen, or other nonsteroidal anti-inflammatory drugs (NSAIDs). SEEK IMMEDIATE MEDICAL CARE IF:   You have pain in your arms, neck, jaw, teeth, or back.  Your pain increases or changes in intensity or duration.  You develop nausea, vomiting, or sweating (diaphoresis).  You develop shortness of breath, or you faint.  Your vomit is green, yellow, black, or looks like coffee grounds or blood.  Your stool is red, bloody, or black. These symptoms could be signs of other problems, such as heart disease, gastric bleeding, or esophageal bleeding. MAKE SURE YOU:   Understand these instructions.  Will watch your condition.  Will get help right away if you are not doing well or get worse. Document Released: 05/27/2005 Document Revised: 11/09/2011 Document Reviewed: 03/06/2011 [facility] Patient Information 2013 [place], [place].

## 2012-11-22 ENCOUNTER — Encounter: Payer: Self-pay | Admitting: Family Medicine

## 2012-11-27 NOTE — Assessment & Plan Note (Signed)
Well treated with Levothyroxine 

## 2012-11-27 NOTE — Assessment & Plan Note (Signed)
flonase and Loratadine prn

## 2012-11-27 NOTE — Progress Notes (Signed)
Patient ID: Michelle Jacobs, female   DOB: [birth year], 67 y.o.   MRN: [id number] Michelle Jacobs [id number] [birth year] 11/27/2012      Progress Note-Follow Up  Subjective  Chief Complaint  Chief Complaint  Patient presents with  . Follow-up    6 month    HPI  Patient is a 67 year old Caucasian female who is in today for followup. Overall feeling well. Taking medications as prescribed. No recent flares in allergies or reflux. Uses minutes as needed. No chest pain, fevers, shortness of breath, palpitations, GI or GU concerns noted today.  Past Medical History  Diagnosis Date  . History of chicken pox   . GERD (gastroesophageal reflux disease)     mild  . Allergy   . Hypertension   . Hyperlipidemia   . Personal history of colonic polyps 2009    Benign colon polyps    History reviewed. No pertinent past surgical history.  Family History  Problem Relation Age of Onset  . Cancer Mother     lung  . Hypertension Mother   . Heart disease Father   . Diabetes Maternal Uncle     type II  . Cancer Maternal Grandmother     breast    History   Social History  . Marital Status: Married    Spouse Name: N/A    Number of Children: 2  . Years of Education: N/A   Occupational History  .     Social History Main Topics  . Smoking status: Never Smoker   . Smokeless tobacco: Never Used  . Alcohol Use: 6.0 oz/week    10 Glasses of wine per week  . Drug Use: No  . Sexually Active: Not on file   Other Topics Concern  . Not on file   Social History Narrative   Caffeine use:  2-3 cups coffee daily   Regular exercise:  Walks daily          Current Outpatient Prescriptions on File Prior to Visit  Medication Sig Dispense Refill  . alendronate (FOSAMAX) 70 MG tablet Take 1 tablet (70 mg total) by mouth every 7 (seven) days. Take with a full glass of water on an empty stomach.  12 tablet  3  . aspirin EC 81 MG tablet Take 81 mg by mouth daily.        . Calcium  Carbonate-Vitamin D (CALCIUM-D) 600-400 MG-UNIT TABS Take 1 tablet by mouth daily.       . Cholecalciferol (VITAMIN D) 2000 UNITS CAPS Take 1 capsule by mouth daily.        . fexofenadine (ALLEGRA) 180 MG tablet Take 180 mg by mouth daily.      Marland Kitchen levothyroxine (SYNTHROID, LEVOTHROID) 125 MCG tablet Take 1 tablet (125 mcg total) by mouth daily.  90 tablet  3  . lisinopril (PRINIVIL,ZESTRIL) 10 MG tablet Take 1 tablet (10 mg total) by mouth daily.  90 tablet  3  . Multiple Vitamin (MULTIVITAMIN) tablet Take 1 tablet by mouth daily.        . Omega-3 Fatty Acids (FISH OIL) 1200 MG CAPS Take 4 capsules by mouth daily.        Marland Kitchen omeprazole (PRILOSEC) 40 MG capsule Take 1 capsule (40 mg total) by mouth daily.  90 capsule  3   No current facility-administered medications on file prior to visit.    Allergies  Allergen Reactions  . Iodine Rash  . Mercurochrome (Merbromin) Rash  . Sulfa Antibiotics Rash  Review of Systems  Review of Systems  Constitutional: Negative for fever and malaise/fatigue.  HENT: Negative for congestion.   Eyes: Negative for discharge.  Respiratory: Negative for shortness of breath.   Cardiovascular: Negative for chest pain, palpitations and leg swelling.  Gastrointestinal: Negative for nausea, abdominal pain and diarrhea.  Genitourinary: Negative for dysuria.  Musculoskeletal: Negative for falls.  Skin: Negative for rash.  Neurological: Negative for loss of consciousness and headaches.  Endo/Heme/Allergies: Negative for polydipsia.  Psychiatric/Behavioral: Negative for depression and suicidal ideas. The patient is not nervous/anxious and does not have insomnia.     Objective  BP 120/74  Pulse 65  Temp(Src) 98.7 F (37.1 C) (Oral)  Ht 5' 1.5" (1.562 m)  Wt 151 lb (68.493 kg)  BMI 28.07 kg/m2  SpO2 97%  Physical Exam  Physical Exam  Constitutional: She is oriented to person, place, and time and well-developed, well-nourished, and in no distress. No  distress.  HENT:  Head: Normocephalic and atraumatic.  Eyes: Conjunctivae are normal.  Neck: Neck supple. No thyromegaly present.  Cardiovascular: Normal rate, regular rhythm and normal heart sounds.   No murmur heard. Pulmonary/Chest: Effort normal and breath sounds normal. She has no wheezes.  Abdominal: She exhibits no distension and no mass.  Musculoskeletal: She exhibits no edema.  Lymphadenopathy:    She has no cervical adenopathy.  Neurological: She is alert and oriented to person, place, and time.  Skin: Skin is warm and dry. No rash noted. She is not diaphoretic.  Psychiatric: Memory, affect and judgment normal.    Lab Results  Component Value Date   TSH 4.387 05/30/2012   Lab Results  Component Value Date   WBC 7.0 12/02/2011   HGB 13.8 12/02/2011   HCT 41.8 12/02/2011   MCV 95.2 12/02/2011   PLT 314 12/02/2011   Lab Results  Component Value Date   CREATININE 0.85 05/30/2012   BUN 15 05/30/2012   NA 140 05/30/2012   K 4.9 05/30/2012   CL 104 05/30/2012   CO2 28 05/30/2012   Lab Results  Component Value Date   ALT 25 12/02/2011   AST 23 12/02/2011   ALKPHOS 60 12/02/2011   BILITOT 0.9 12/02/2011   Lab Results  Component Value Date   CHOL 188 12/02/2011   Lab Results  Component Value Date   HDL 57 12/02/2011   Lab Results  Component Value Date   LDLCALC 86 12/02/2011   Lab Results  Component Value Date   TRIG 227* 12/02/2011   Lab Results  Component Value Date   CHOLHDL 3.3 12/02/2011     Assessment & Plan  HTN (hypertension) Well controlled on current meds.  Hypothyroidism Well treated with Levothyroxine  Other and unspecified hyperlipidemia Avoid trans fats, consider krill oil and minimize simple carbs  Allergic rhinitis flonase and Loratadine prn

## 2012-11-27 NOTE — Assessment & Plan Note (Signed)
Avoid trans fats, consider krill oil and minimize simple carbs

## 2013-05-15 ENCOUNTER — Ambulatory Visit (INDEPENDENT_AMBULATORY_CARE_PROVIDER_SITE_OTHER): Payer: Medicare Other | Admitting: Family Medicine

## 2013-05-15 ENCOUNTER — Encounter: Payer: Self-pay | Admitting: Family Medicine

## 2013-05-15 ENCOUNTER — Telehealth: Payer: Self-pay | Admitting: Family Medicine

## 2013-05-15 VITALS — BP 100/68 | HR 69 | Temp 98.4°F | Ht 61.5 in | Wt 147.1 lb

## 2013-05-15 DIAGNOSIS — E785 Hyperlipidemia, unspecified: Secondary | ICD-10-CM

## 2013-05-15 DIAGNOSIS — R739 Hyperglycemia, unspecified: Secondary | ICD-10-CM

## 2013-05-15 DIAGNOSIS — E039 Hypothyroidism, unspecified: Secondary | ICD-10-CM

## 2013-05-15 DIAGNOSIS — Z0000 Encounter for general adult medical examination without abnormal findings: Secondary | ICD-10-CM

## 2013-05-15 DIAGNOSIS — T7840XA Allergy, unspecified, initial encounter: Secondary | ICD-10-CM

## 2013-05-15 DIAGNOSIS — R7309 Other abnormal glucose: Secondary | ICD-10-CM

## 2013-05-15 DIAGNOSIS — I10 Essential (primary) hypertension: Secondary | ICD-10-CM

## 2013-05-15 DIAGNOSIS — Z23 Encounter for immunization: Secondary | ICD-10-CM

## 2013-05-15 LAB — RENAL FUNCTION PANEL
BUN: 20 mg/dL (ref 6–23)
CO2: 26 mEq/L (ref 19–32)
Chloride: 101 mEq/L (ref 96–112)
Creat: 0.86 mg/dL (ref 0.50–1.10)
Glucose, Bld: 104 mg/dL — ABNORMAL HIGH (ref 70–99)
Phosphorus: 3.9 mg/dL (ref 2.3–4.6)
Potassium: 5 mEq/L (ref 3.5–5.3)

## 2013-05-15 LAB — HEPATIC FUNCTION PANEL
ALT: 29 U/L (ref 0–35)
AST: 28 U/L (ref 0–37)
Albumin: 4.6 g/dL (ref 3.5–5.2)
Alkaline Phosphatase: 70 U/L (ref 39–117)
Indirect Bilirubin: 0.6 mg/dL (ref 0.0–0.9)
Total Protein: 7.4 g/dL (ref 6.0–8.3)

## 2013-05-15 LAB — LIPID PANEL
LDL Cholesterol: 126 mg/dL — ABNORMAL HIGH (ref 0–99)
Triglycerides: 78 mg/dL (ref <150)
VLDL: 16 mg/dL (ref 0–40)

## 2013-05-15 LAB — CBC
HCT: 39.4 % (ref 36.0–46.0)
Hemoglobin: 13.9 g/dL (ref 12.0–15.0)
MCV: 92.7 fL (ref 78.0–100.0)
WBC: 6.1 10*3/uL (ref 4.0–10.5)

## 2013-05-15 LAB — HEMOGLOBIN A1C: Hgb A1c MFr Bld: 5.2 % (ref <5.7)

## 2013-05-15 MED ORDER — LISINOPRIL 10 MG PO TABS: 10.0000 mg | ORAL_TABLET | Freq: Every day | ORAL | Status: DC

## 2013-05-15 MED ORDER — FLUTICASONE PROPIONATE 50 MCG/ACT NA SUSP: 2.0000 | Freq: Every day | NASAL | Status: DC | PRN

## 2013-05-15 MED ORDER — ALENDRONATE SODIUM 70 MG PO TABS: 70.0000 mg | ORAL_TABLET | ORAL | Status: DC

## 2013-05-15 MED ORDER — LEVOTHYROXINE SODIUM 125 MCG PO TABS: 125.0000 ug | ORAL_TABLET | Freq: Every day | ORAL | Status: DC

## 2013-05-15 MED ORDER — OMEPRAZOLE 40 MG PO CPDR: 40.0000 mg | DELAYED_RELEASE_CAPSULE | Freq: Every day | ORAL | Status: DC

## 2013-05-15 NOTE — Progress Notes (Signed)
Patient ID: Michelle Jacobs, female   DOB: 1946/05/22, 67 y.o.   MRN: [id number] [facility] [id number] 1945-12-27 05/15/2013      Progress Note-Follow Up  Subjective  Chief Complaint  Chief Complaint  Patient presents with  . Follow-up    6 month  . Injections    flu    HPI  Patient is a 67 year old Caucasian female in today in followup. Feeling well. No recent illness. No trips to the emergency room. Denies chest pain, palpitations, shortness of breath, fevers, GI or GU concerns. Is taking medications as prescribed.  Past Medical History  Diagnosis Date  . History of chicken pox   . GERD (gastroesophageal reflux disease)     mild  . Allergy   . Hypertension   . Hyperlipidemia   . Personal history of colonic polyps 2009    Benign colon polyps    History reviewed. No pertinent past surgical history.  Family History  Problem Relation Age of Onset  . Cancer Mother     lung  . Hypertension Mother   . Heart disease Father   . Diabetes Maternal Uncle     type II  . Cancer Maternal Grandmother     breast    History   Social History  . Marital Status: Married    Spouse Name: N/A    Number of Children: 2  . Years of Education: N/A   Occupational History  .     Social History Main Topics  . Smoking status: Never Smoker   . Smokeless tobacco: Never Used  . Alcohol Use: 6.0 oz/week    10 Glasses of wine per week  . Drug Use: No  . Sexual Activity: Not on file   Other Topics Concern  . Not on file   Social History Narrative   Caffeine use:  2-3 cups coffee daily   Regular exercise:  Walks daily          Current Outpatient Prescriptions on File Prior to Visit  Medication Sig Dispense Refill  . alendronate (FOSAMAX) 70 MG tablet Take 1 tablet (70 mg total) by mouth every 7 (seven) days. Take with a full glass of water on an empty stomach.  12 tablet  3  . aspirin EC 81 MG tablet Take 81 mg by mouth daily.        . Calcium Carbonate-Vitamin D  (CALCIUM-D) 600-400 MG-UNIT TABS Take 1 tablet by mouth daily.       . Cholecalciferol (VITAMIN D) 2000 UNITS CAPS Take 1 capsule by mouth daily.        . fexofenadine (ALLEGRA) 180 MG tablet Take 180 mg by mouth daily.      . fluticasone (FLONASE) 50 MCG/ACT nasal spray Place 2 sprays into the nose daily as needed for rhinitis or allergies.  48 g  6  . levothyroxine (SYNTHROID, LEVOTHROID) 125 MCG tablet Take 1 tablet (125 mcg total) by mouth daily.  90 tablet  3  . lisinopril (PRINIVIL,ZESTRIL) 10 MG tablet Take 1 tablet (10 mg total) by mouth daily.  90 tablet  3  . Multiple Vitamin (MULTIVITAMIN) tablet Take 1 tablet by mouth daily.        . Omega-3 Fatty Acids (FISH OIL) 1200 MG CAPS Take 4 capsules by mouth daily.        Marland Kitchen omeprazole (PRILOSEC) 40 MG capsule Take 1 capsule (40 mg total) by mouth daily.  90 capsule  3   No current facility-administered medications on file  prior to visit.    Allergies  Allergen Reactions  . Iodine Rash  . Mercurochrome [Merbromin] Rash  . Sulfa Antibiotics Rash    Review of Systems  Review of Systems  Constitutional: Negative for fever and malaise/fatigue.  HENT: Negative for congestion.   Eyes: Negative for discharge.  Respiratory: Negative for shortness of breath.   Cardiovascular: Negative for chest pain, palpitations and leg swelling.  Gastrointestinal: Negative for nausea, abdominal pain and diarrhea.  Genitourinary: Negative for dysuria.  Musculoskeletal: Negative for falls.  Skin: Negative for rash.  Neurological: Negative for loss of consciousness and headaches.  Endo/Heme/Allergies: Negative for polydipsia.  Psychiatric/Behavioral: Negative for depression and suicidal ideas. The patient is not nervous/anxious and does not have insomnia.     Objective  BP 100/68  Pulse 69  Temp(Src) 98.4 F (36.9 C) (Oral)  Ht 5' 1.5" (1.562 m)  Wt 147 lb 1.9 oz (66.733 kg)  BMI 27.35 kg/m2  SpO2 95%  Physical Exam  Physical Exam   Constitutional: She is oriented to person, place, and time and well-developed, well-nourished, and in no distress. No distress.  HENT:  Head: Normocephalic and atraumatic.  Eyes: Conjunctivae are normal.  Neck: Neck supple. No thyromegaly present.  Cardiovascular: Normal rate, regular rhythm and normal heart sounds.   No murmur heard. Pulmonary/Chest: Effort normal and breath sounds normal. She has no wheezes.  Abdominal: She exhibits no distension and no mass.  Musculoskeletal: She exhibits no edema.  Lymphadenopathy:    She has no cervical adenopathy.  Neurological: She is alert and oriented to person, place, and time.  Skin: Skin is warm and dry. No rash noted. She is not diaphoretic.  Psychiatric: Memory, affect and judgment normal.    Lab Results  Component Value Date   TSH 4.387 05/30/2012   Lab Results  Component Value Date   WBC 7.0 12/02/2011   HGB 13.8 12/02/2011   HCT 41.8 12/02/2011   MCV 95.2 12/02/2011   PLT 314 12/02/2011   Lab Results  Component Value Date   CREATININE 0.85 05/30/2012   BUN 15 05/30/2012   NA 140 05/30/2012   K 4.9 05/30/2012   CL 104 05/30/2012   CO2 28 05/30/2012   Lab Results  Component Value Date   ALT 25 12/02/2011   AST 23 12/02/2011   ALKPHOS 60 12/02/2011   BILITOT 0.9 12/02/2011   Lab Results  Component Value Date   CHOL 188 12/02/2011   Lab Results  Component Value Date   HDL 57 12/02/2011   Lab Results  Component Value Date   LDLCALC 86 12/02/2011   Lab Results  Component Value Date   TRIG 227* 12/02/2011   Lab Results  Component Value Date   CHOLHDL 3.3 12/02/2011     Assessment & Plan  HTN (hypertension) Well controlled at today's visit. No changes, refills provided.  Hypothyroidism Well treated on current dose of medication, no changes.  Other and unspecified hyperlipidemia Mild, avoid trans fats, consider krill oil increase exercise  Preventative health care Given flu shot today, had Zostavax at [facility] in August  2014  Hyperglycemia Mild, hgba1c 5.2. Minimize simple carbs

## 2013-05-15 NOTE — Telephone Encounter (Signed)
LAB ORDER LAST WEEK OF February  2015  comments: Next visit annual exam with labs at or prior to next visit lipid, renal, cbc, tsh, hepatic

## 2013-05-20 ENCOUNTER — Encounter: Payer: Self-pay | Admitting: Family Medicine

## 2013-05-20 DIAGNOSIS — R739 Hyperglycemia, unspecified: Secondary | ICD-10-CM

## 2013-05-20 HISTORY — DX: Hyperglycemia, unspecified: R73.9

## 2013-05-20 NOTE — Assessment & Plan Note (Addendum)
Given flu shot today, had Zostavax at [facility] in August 2014

## 2013-05-20 NOTE — Assessment & Plan Note (Signed)
Mild, hgba1c 5.2. Minimize simple carbs

## 2013-05-20 NOTE — Assessment & Plan Note (Signed)
Well treated on current dose of medication, no changes.

## 2013-05-20 NOTE — Assessment & Plan Note (Signed)
Mild, avoid trans fats, consider krill oil increase exercise

## 2013-05-20 NOTE — Assessment & Plan Note (Signed)
Well controlled at today's visit. No changes, refills provided.

## 2013-09-07 ENCOUNTER — Telehealth: Payer: Self-pay | Admitting: Family Medicine

## 2013-09-07 DIAGNOSIS — T7840XA Allergy, unspecified, initial encounter: Secondary | ICD-10-CM

## 2013-09-07 MED ORDER — LEVOTHYROXINE SODIUM 125 MCG PO TABS: 125.0000 ug | ORAL_TABLET | Freq: Every day | ORAL | Status: DC

## 2013-09-07 MED ORDER — LISINOPRIL 10 MG PO TABS: 10.0000 mg | ORAL_TABLET | Freq: Every day | ORAL | Status: DC

## 2013-09-07 MED ORDER — FLUTICASONE PROPIONATE 50 MCG/ACT NA SUSP: 2.0000 | Freq: Every day | NASAL | Status: DC | PRN

## 2013-09-07 MED ORDER — OMEPRAZOLE 40 MG PO CPDR: 40.0000 mg | DELAYED_RELEASE_CAPSULE | Freq: Every day | ORAL | Status: DC

## 2013-09-07 MED ORDER — ALENDRONATE SODIUM 70 MG PO TABS: 70.0000 mg | ORAL_TABLET | ORAL | Status: DC

## 2013-09-07 NOTE — Telephone Encounter (Signed)
Refill-alendronate   Refill- lisinopril  Refill- omeprazole

## 2013-09-07 NOTE — Telephone Encounter (Signed)
Refill- levothyroxine  Refill- fluticasone

## 2013-11-03 ENCOUNTER — Encounter: Payer: Medicare Other | Admitting: Family Medicine

## 2014-01-23 ENCOUNTER — Ambulatory Visit (INDEPENDENT_AMBULATORY_CARE_PROVIDER_SITE_OTHER): Payer: Medicare PPO | Admitting: Family Medicine

## 2014-01-23 ENCOUNTER — Other Ambulatory Visit: Payer: Self-pay | Admitting: Family Medicine

## 2014-01-23 ENCOUNTER — Encounter: Payer: Self-pay | Admitting: Family Medicine

## 2014-01-23 VITALS — BP 116/72 | HR 72 | Temp 98.7°F | Ht 61.5 in | Wt 151.0 lb

## 2014-01-23 DIAGNOSIS — E039 Hypothyroidism, unspecified: Secondary | ICD-10-CM

## 2014-01-23 DIAGNOSIS — Z1231 Encounter for screening mammogram for malignant neoplasm of breast: Secondary | ICD-10-CM

## 2014-01-23 DIAGNOSIS — L989 Disorder of the skin and subcutaneous tissue, unspecified: Secondary | ICD-10-CM

## 2014-01-23 DIAGNOSIS — R7309 Other abnormal glucose: Secondary | ICD-10-CM

## 2014-01-23 DIAGNOSIS — Z0000 Encounter for general adult medical examination without abnormal findings: Secondary | ICD-10-CM

## 2014-01-23 DIAGNOSIS — M810 Age-related osteoporosis without current pathological fracture: Secondary | ICD-10-CM

## 2014-01-23 DIAGNOSIS — R739 Hyperglycemia, unspecified: Secondary | ICD-10-CM

## 2014-01-23 DIAGNOSIS — E785 Hyperlipidemia, unspecified: Secondary | ICD-10-CM

## 2014-01-23 NOTE — Patient Instructions (Signed)
Osteoporosis Throughout your life, your body breaks down old bone and replaces it with new bone. As you get older, your body does not replace bone as quickly as it breaks it down. By the age of 30 years, most people begin to gradually lose bone because of the imbalance between bone loss and replacement. Some people lose more bone than others. Bone loss beyond a specified normal degree is considered osteoporosis.  Osteoporosis affects the strength and durability of your bones. The inside of the ends of your bones and your flat bones, like the bones of your pelvis, look like honeycomb, filled with tiny open spaces. As bone loss occurs, your bones become less dense. This means that the open spaces inside your bones become bigger and the walls between these spaces become thinner. This makes your bones weaker. Bones of a person with osteoporosis can become so weak that they can break (fracture) during minor accidents, such as a simple fall. CAUSES  The following factors have been associated with the development of osteoporosis:  Smoking.  Drinking more than 2 alcoholic drinks several days per week.  Long-term use of certain medicines:  Corticosteroids.  Chemotherapy medicines.  Thyroid medicines.  Antiepileptic medicines.  Gonadal hormone suppression medicine.  Immunosuppression medicine.  Being underweight.  Lack of physical activity.  Lack of exposure to the sun. This can lead to vitamin D deficiency.  Certain medical conditions:  Certain inflammatory bowel diseases, such as Crohn disease and ulcerative colitis.  Diabetes.  Hyperthyroidism.  Hyperparathyroidism. RISK FACTORS Anyone can develop osteoporosis. However, the following factors can increase your risk of developing osteoporosis:  Gender Women are at higher risk than men.  Age Being older than 50 years increases your risk.  Ethnicity White and Asian people have an increased risk.  Weight Being extremely  underweight can increase your risk of osteoporosis.  Family history of osteoporosis Having a family member who has developed osteoporosis can increase your risk. SYMPTOMS  Usually, people with osteoporosis have no symptoms.  DIAGNOSIS  Signs during a physical exam that may prompt your caregiver to suspect osteoporosis include:  Decreased height. This is usually caused by the compression of the bones that form your spine (vertebrae) because they have weakened and become fractured.  A curving or rounding of the upper back (kyphosis). To confirm signs of osteoporosis, your caregiver may request a procedure that uses 2 low-dose X-ray beams with different levels of energy to measure your bone mineral density (dual-energy X-ray absorptiometry [DXA]). Also, your caregiver may check your level of vitamin D. TREATMENT  The goal of osteoporosis treatment is to strengthen bones in order to decrease the risk of bone fractures. There are different types of medicines available to help achieve this goal. Some of these medicines work by slowing the processes of bone loss. Some medicines work by increasing bone density. Treatment also involves making sure that your levels of calcium and vitamin D are adequate. PREVENTION  There are things you can do to help prevent osteoporosis. Adequate intake of calcium and vitamin D can help you achieve optimal bone mineral density. Regular exercise can also help, especially resistance and weight-bearing activities. If you smoke, quitting smoking is an important part of osteoporosis prevention. MAKE SURE YOU:  Understand these instructions.  Will watch your condition.  Will get help right away if you are not doing well or get worse. FOR MORE INFORMATION www.osteo.org and www.nof.org Document Released: 05/27/2005 Document Revised: 12/12/2012 Document Reviewed: 08/01/2011 [employer] Patient Information 2014 [employer].  

## 2014-01-23 NOTE — Progress Notes (Signed)
Pre visit review using our clinic review tool, if applicable. No additional management support is needed unless otherwise documented below in the visit note. 

## 2014-01-24 ENCOUNTER — Ambulatory Visit (HOSPITAL_BASED_OUTPATIENT_CLINIC_OR_DEPARTMENT_OTHER)
Admission: RE | Admit: 2014-01-24 | Discharge: 2014-01-24 | Disposition: A | Discharge status: Home / Self Care | Payer: Medicare PPO | Source: Ambulatory Visit | Attending: Family Medicine | Admitting: Family Medicine

## 2014-01-24 DIAGNOSIS — Z1231 Encounter for screening mammogram for malignant neoplasm of breast: Secondary | ICD-10-CM | POA: Insufficient documentation

## 2014-01-28 ENCOUNTER — Encounter: Payer: Self-pay | Admitting: Family Medicine

## 2014-01-28 DIAGNOSIS — L989 Disorder of the skin and subcutaneous tissue, unspecified: Secondary | ICD-10-CM | POA: Insufficient documentation

## 2014-01-28 NOTE — Assessment & Plan Note (Signed)
Patient encouraged to maintain heart healthy diet, regular exercise, adequate sleep. Consider daily probiotics. Take medications as prescribed. Patient denies any difficulties at home. No trouble with ADLs, depression or falls. No recent changes to vision or hearing. Is UTD with immunizations. Is UTD with screening. Discussed Advanced Directives, patient agrees to bring us copies of documents if can. Encouraged heart healthy diet, exercise as tolerated and adequate sleep.  

## 2014-01-28 NOTE — Assessment & Plan Note (Signed)
Mild, hgba1c acceptable on last check, minimize simple carbs. Increase exercise as tolerated.

## 2014-01-28 NOTE — Progress Notes (Signed)
Patient ID: Michelle Jacobs, female   DOB: 1946-06-24, 68 y.o.   MRN: [id number] Michelle Jacobs [id number] [birth year] 01/28/2014      Progress Note-Follow Up  Subjective  Chief Complaint  Chief Complaint  Patient presents with  . Annual Exam    physical    HPI  Patient is a 69 year old female in today for routine medical care. Has not had any significant acute illness or trips to the emergency room. Is noting her eyes are beginning to struggle somewhat more and is following up with ophthalmology. Describes her eyes is tired frequently. Reports being on Fosamax for roughly 5 possibly more years without difficulty. Denies CP/palp/SOB/HA/congestion/fevers/GI or GU c/o. Taking meds as prescribed  Past Medical History  Diagnosis Date  . History of chicken pox   . GERD (gastroesophageal reflux disease)     mild  . Allergy   . Hypertension   . Hyperlipidemia   . Personal history of colonic polyps 2009    Benign colon polyps  . Preventative health care 12/02/2011  . Hyperglycemia 05/20/2013  . Medicare annual wellness visit, subsequent 12/02/2011    History reviewed. No pertinent past surgical history.  Family History  Problem Relation Age of Onset  . Cancer Mother     lung  . Hypertension Mother   . Heart disease Father     CHF  . Diabetes Maternal Uncle     type II  . Cancer Maternal Grandmother 26    breast  . Heart disease Maternal Grandmother     MI, CHF  . Obesity Maternal Grandmother   . Cancer Sister     uterine  . Hypertension Sister   . Hyperlipidemia Brother   . Hypertension Brother   . Kidney disease Maternal Grandfather     kidney failure  . Cancer Paternal Grandmother   . COPD Paternal Grandfather     History   Social History  . Marital Status: Married    Spouse Name: N/A    Number of Children: 2  . Years of Education: N/A   Occupational History  .     Social History Main Topics  . Smoking status: Never Smoker   . Smokeless tobacco:  Never Used  . Alcohol Use: 6.0 oz/week    10 Glasses of wine per week  . Drug Use: No  . Sexual Activity: Not on file     Comment: lives with husband, no dietary restrictions   Other Topics Concern  . Not on file   Social History Narrative   Caffeine use:  2-3 cups coffee daily   Regular exercise:  Walks daily          Current Outpatient Prescriptions on File Prior to Visit  Medication Sig Dispense Refill  . alendronate (FOSAMAX) 70 MG tablet Take 1 tablet (70 mg total) by mouth every 7 (seven) days. Take with a full glass of water on an empty stomach.  12 tablet  0  . aspirin EC 81 MG tablet Take 81 mg by mouth daily.        . Calcium Carbonate-Vitamin D (CALCIUM-D) 600-400 MG-UNIT TABS Take 1 tablet by mouth daily.       . Cholecalciferol (VITAMIN D) 2000 UNITS CAPS Take 1 capsule by mouth daily.        . fexofenadine (ALLEGRA) 180 MG tablet Take 180 mg by mouth daily.      . fluticasone (FLONASE) 50 MCG/ACT nasal spray Place 2 sprays into both nostrils daily  as needed for rhinitis or allergies.  48 g  0  . levothyroxine (SYNTHROID, LEVOTHROID) 125 MCG tablet Take 1 tablet (125 mcg total) by mouth daily.  90 tablet  0  . lisinopril (PRINIVIL,ZESTRIL) 10 MG tablet Take 1 tablet (10 mg total) by mouth daily.  90 tablet  0  . Multiple Vitamin (MULTIVITAMIN) tablet Take 1 tablet by mouth daily.        . Omega-3 Fatty Acids (FISH OIL) 1200 MG CAPS Take 4 capsules by mouth daily.        Marland Kitchen omeprazole (PRILOSEC) 40 MG capsule Take 1 capsule (40 mg total) by mouth daily.  90 capsule  0   No current facility-administered medications on file prior to visit.    Allergies  Allergen Reactions  . Iodine Rash  . Mercurochrome [Merbromin] Rash  . Sulfa Antibiotics Rash    Review of Systems  Review of Systems  Constitutional: Negative for fever, chills and malaise/fatigue.  HENT: Negative for congestion, hearing loss and nosebleeds.   Eyes: Negative for discharge.  Respiratory:  Negative for cough, sputum production, shortness of breath and wheezing.   Cardiovascular: Negative for chest pain, palpitations and leg swelling.  Gastrointestinal: Negative for heartburn, nausea, vomiting, abdominal pain, diarrhea, constipation and blood in stool.  Genitourinary: Negative for dysuria, urgency, frequency and hematuria.  Musculoskeletal: Negative for back pain, falls and myalgias.  Skin: Negative for rash.  Neurological: Negative for dizziness, tremors, sensory change, focal weakness, loss of consciousness, weakness and headaches.  Endo/Heme/Allergies: Negative for environmental allergies and polydipsia. Does not bruise/bleed easily.  Psychiatric/Behavioral: Negative for depression and suicidal ideas. The patient is not nervous/anxious and does not have insomnia.     Objective  BP 116/72  Pulse 72  Temp(Src) 98.7 F (37.1 C) (Oral)  Ht 5' 1.5" (1.562 m)  Wt 151 lb 0.6 oz (68.511 kg)  BMI 28.08 kg/m2  SpO2 96%  Physical Exam  Physical Exam  Constitutional: She is oriented to person, place, and time and well-developed, well-nourished, and in no distress. No distress.  HENT:  Head: Normocephalic and atraumatic.  Right Ear: External ear normal.  Left Ear: External ear normal.  Nose: Nose normal.  Mouth/Throat: Oropharynx is clear and moist. No oropharyngeal exudate.  Eyes: Conjunctivae are normal. Pupils are equal, round, and reactive to light. Right eye exhibits no discharge. Left eye exhibits no discharge. No scleral icterus.  Neck: Normal range of motion. Neck supple. No thyromegaly present.  Cardiovascular: Normal rate, regular rhythm, normal heart sounds and intact distal pulses.   No murmur heard. Pulmonary/Chest: Effort normal and breath sounds normal. No respiratory distress. She has no wheezes. She has no rales.  Abdominal: Soft. Bowel sounds are normal. She exhibits no distension and no mass. There is no tenderness.  Musculoskeletal: Normal range of  motion. She exhibits no edema and no tenderness.  Lymphadenopathy:    She has no cervical adenopathy.  Neurological: She is alert and oriented to person, place, and time. She has normal reflexes. No cranial nerve deficit. Coordination normal.  Skin: Skin is warm and dry. No rash noted. She is not diaphoretic.  Irregular lesion on back, middle at left side, 3 central spots of medium brown surrounded by lighter brown cirles  Psychiatric: Mood, memory and affect normal.    Lab Results  Component Value Date   TSH 1.420 05/15/2013   Lab Results  Component Value Date   WBC 6.1 05/15/2013   HGB 13.9 05/15/2013   HCT 39.4 05/15/2013  MCV 92.7 05/15/2013   PLT 327 05/15/2013   Lab Results  Component Value Date   CREATININE 0.86 05/15/2013   BUN 20 05/15/2013   NA 135 05/15/2013   K 5.0 05/15/2013   CL 101 05/15/2013   CO2 26 05/15/2013   Lab Results  Component Value Date   ALT 29 05/15/2013   AST 28 05/15/2013   ALKPHOS 70 05/15/2013   BILITOT 0.7 05/15/2013   Lab Results  Component Value Date   CHOL 200 05/15/2013   Lab Results  Component Value Date   HDL 58 05/15/2013   Lab Results  Component Value Date   LDLCALC 126* 05/15/2013   Lab Results  Component Value Date   TRIG 78 05/15/2013   Lab Results  Component Value Date   CHOLHDL 3.4 05/15/2013     Assessment & Plan  Medicare annual wellness visit, subsequent Patient encouraged to maintain heart healthy diet, regular exercise, adequate sleep. Consider daily probiotics. Take medications as prescribed. Patient denies any difficulties at home. No trouble with ADLs, depression or falls. No recent changes to vision or hearing. Is UTD with immunizations. Is UTD with screening. Discussed Advanced Directives, patient agrees to bring [place] copies of documents if can. Encouraged heart healthy diet, exercise as tolerated and adequate sleep  Other and unspecified hyperlipidemia Encouraged heart healthy diet, increase exercise, avoid trans  fats, consider a krill oil cap daily  Hypothyroidism On Levothyroxine, continue to monitor  Hyperglycemia Mild, hgba1c acceptable on last check, minimize simple carbs. Increase exercise as tolerated.   Skin lesion of back New and several shades of brown, will refer to dermatology for consideration  Osteoporosis Tolerating Fosamax but has been on it roughly 5 years per patient, will proceed with repeat bone scan. Maintain calcium, vitamin d intake and exercise as tolerated

## 2014-01-28 NOTE — Assessment & Plan Note (Signed)
New and several shades of brown, will refer to dermatology for consideration

## 2014-01-28 NOTE — Assessment & Plan Note (Signed)
On Levothyroxine, continue to monitor 

## 2014-01-28 NOTE — Assessment & Plan Note (Signed)
Encouraged heart healthy diet, increase exercise, avoid trans fats, consider a krill oil cap daily 

## 2014-01-28 NOTE — Assessment & Plan Note (Signed)
Tolerating Fosamax but has been on it roughly 5 years per patient, will proceed with repeat bone scan. Maintain calcium, vitamin d intake and exercise as tolerated

## 2014-02-02 ENCOUNTER — Encounter: Payer: Self-pay | Admitting: Family Medicine

## 2014-02-05 ENCOUNTER — Ambulatory Visit (INDEPENDENT_AMBULATORY_CARE_PROVIDER_SITE_OTHER)
Admission: RE | Admit: 2014-02-05 | Discharge: 2014-02-05 | Disposition: A | Discharge status: Home / Self Care | Payer: Medicare PPO | Source: Ambulatory Visit | Attending: Family Medicine | Admitting: Family Medicine

## 2014-02-05 DIAGNOSIS — M810 Age-related osteoporosis without current pathological fracture: Secondary | ICD-10-CM

## 2014-02-08 ENCOUNTER — Other Ambulatory Visit: Payer: Self-pay

## 2014-02-08 MED ORDER — LEVOTHYROXINE SODIUM 125 MCG PO TABS: 125.0000 ug | ORAL_TABLET | Freq: Every day | ORAL | Status: DC

## 2014-02-08 MED ORDER — OMEPRAZOLE 40 MG PO CPDR: 40.0000 mg | DELAYED_RELEASE_CAPSULE | Freq: Every day | ORAL | Status: DC

## 2014-02-08 MED ORDER — LISINOPRIL 10 MG PO TABS: 10.0000 mg | ORAL_TABLET | Freq: Every day | ORAL | Status: DC

## 2014-02-13 ENCOUNTER — Other Ambulatory Visit: Payer: Self-pay

## 2014-02-13 MED ORDER — ALENDRONATE SODIUM 70 MG PO TABS: 70.0000 mg | ORAL_TABLET | ORAL | Status: DC

## 2014-05-01 ENCOUNTER — Other Ambulatory Visit: Payer: Self-pay

## 2014-05-01 ENCOUNTER — Other Ambulatory Visit: Payer: Self-pay | Admitting: Family Medicine

## 2014-05-01 MED ORDER — OMEPRAZOLE 40 MG PO CPDR: 40.0000 mg | DELAYED_RELEASE_CAPSULE | Freq: Every day | ORAL | Status: DC

## 2014-05-01 MED ORDER — LEVOTHYROXINE SODIUM 125 MCG PO TABS: 125.0000 ug | ORAL_TABLET | Freq: Every day | ORAL | Status: DC

## 2014-05-01 MED ORDER — LISINOPRIL 10 MG PO TABS: 10.0000 mg | ORAL_TABLET | Freq: Every day | ORAL | Status: DC

## 2014-07-20 ENCOUNTER — Ambulatory Visit (INDEPENDENT_AMBULATORY_CARE_PROVIDER_SITE_OTHER): Payer: Medicare PPO

## 2014-07-20 DIAGNOSIS — Z23 Encounter for immunization: Secondary | ICD-10-CM

## 2014-07-20 NOTE — Progress Notes (Signed)
Pt tolerated injection well.  No signs of a reaction upon leaving the clinic.   

## 2014-07-20 NOTE — Progress Notes (Signed)
Pre visit review using our clinic review tool, if applicable. No additional management support is needed unless otherwise documented below in the visit note. 

## 2014-07-30 ENCOUNTER — Ambulatory Visit (INDEPENDENT_AMBULATORY_CARE_PROVIDER_SITE_OTHER): Payer: Medicare PPO | Admitting: Family Medicine

## 2014-07-30 ENCOUNTER — Encounter: Payer: Self-pay | Admitting: Family Medicine

## 2014-07-30 DIAGNOSIS — R739 Hyperglycemia, unspecified: Secondary | ICD-10-CM

## 2014-07-30 DIAGNOSIS — I10 Essential (primary) hypertension: Secondary | ICD-10-CM

## 2014-07-30 DIAGNOSIS — E039 Hypothyroidism, unspecified: Secondary | ICD-10-CM

## 2014-07-30 DIAGNOSIS — Z9889 Other specified postprocedural states: Secondary | ICD-10-CM

## 2014-07-30 DIAGNOSIS — E785 Hyperlipidemia, unspecified: Secondary | ICD-10-CM

## 2014-07-30 DIAGNOSIS — M810 Age-related osteoporosis without current pathological fracture: Secondary | ICD-10-CM

## 2014-07-30 DIAGNOSIS — Z1211 Encounter for screening for malignant neoplasm of colon: Secondary | ICD-10-CM

## 2014-07-30 DIAGNOSIS — Z86010 Personal history of colonic polyps: Secondary | ICD-10-CM

## 2014-07-30 DIAGNOSIS — Z23 Encounter for immunization: Secondary | ICD-10-CM

## 2014-07-30 LAB — RENAL FUNCTION PANEL
Albumin: 4.3 g/dL (ref 3.5–5.2)
BUN: 13 mg/dL (ref 6–23)
CO2: 24 mEq/L (ref 19–32)
Calcium: 9.3 mg/dL (ref 8.4–10.5)
Chloride: 102 mEq/L (ref 96–112)
Creatinine, Ser: 0.9 mg/dL (ref 0.4–1.2)
GFR: 64.68 mL/min (ref >60.00)
Glucose, Bld: 88 mg/dL (ref 70–99)
PHOSPHORUS: 3.1 mg/dL (ref 2.3–4.6)
Potassium: 3.8 mEq/L (ref 3.5–5.1)
Sodium: 134 mEq/L — ABNORMAL LOW (ref 135–145)

## 2014-07-30 LAB — LIPID PANEL
CHOL/HDL RATIO: 5
Cholesterol: 255 mg/dL — ABNORMAL HIGH (ref 0–200)
HDL: 55.9 mg/dL (ref >39.00)
LDL Cholesterol: 169 mg/dL — ABNORMAL HIGH (ref 0–99)
NONHDL: 199.1
Triglycerides: 153 mg/dL — ABNORMAL HIGH (ref 0.0–149.0)
VLDL: 30.6 mg/dL (ref 0.0–40.0)

## 2014-07-30 LAB — HEPATIC FUNCTION PANEL
ALBUMIN: 4.3 g/dL (ref 3.5–5.2)
ALK PHOS: 59 U/L (ref 39–117)
ALT: 37 U/L — ABNORMAL HIGH (ref 0–35)
AST: 34 U/L (ref 0–37)
Bilirubin, Direct: 0.1 mg/dL (ref 0.0–0.3)
Total Bilirubin: 0.8 mg/dL (ref 0.2–1.2)
Total Protein: 7.3 g/dL (ref 6.0–8.3)

## 2014-07-30 LAB — CBC
HCT: 38.6 % (ref 36.0–46.0)
Hemoglobin: 13 g/dL (ref 12.0–15.0)
MCHC: 33.8 g/dL (ref 30.0–36.0)
MCV: 93.6 fl (ref 78.0–100.0)
Platelets: 325 10*3/uL (ref 150.0–400.0)
RBC: 4.12 Mil/uL (ref 3.87–5.11)
RDW: 13.2 % (ref 11.5–15.5)
WBC: 5.9 10*3/uL (ref 4.0–10.5)

## 2014-07-30 LAB — TSH: TSH: 5.48 u[IU]/mL — AB (ref 0.35–4.50)

## 2014-07-30 LAB — HEMOGLOBIN A1C: Hgb A1c MFr Bld: 5.1 % (ref 4.6–6.5)

## 2014-07-30 NOTE — Assessment & Plan Note (Signed)
hgba1c acceptable, minimize simple carbs. Increase exercise as tolerated.  

## 2014-07-30 NOTE — Assessment & Plan Note (Signed)
On Levothyroxine, continue to monitor 

## 2014-07-30 NOTE — Progress Notes (Signed)
Michelle Jacobs  [id number] 11-10-1945 07/30/2014      Progress Note-Follow Up  Subjective  Chief Complaint  Chief Complaint  Patient presents with  . Follow-up    6 month    HPI  Patient is a 68 y.o. female in today for routine medical care. Doing well, no recent illness. Denies CP/palp/SOB/HA/congestion/fevers/GI or GU c/o. Taking meds as prescribed. Exercising regularly. Eating calcium several times a day  Past Medical History  Diagnosis Date  . History of chicken pox   . GERD (gastroesophageal reflux disease)     mild  . Allergy   . Hypertension   . Hyperlipidemia   . Personal history of colonic polyps 2009    Benign colon polyps  . Preventative health care 12/02/2011  . Hyperglycemia 05/20/2013  . Medicare annual wellness visit, subsequent 12/02/2011    History reviewed. No pertinent past surgical history.  Family History  Problem Relation Age of Onset  . Cancer Mother     lung  . Hypertension Mother   . Heart disease Father     CHF  . Diabetes Maternal Uncle     type II  . Cancer Maternal Grandmother 23    breast  . Heart disease Maternal Grandmother     MI, CHF  . Obesity Maternal Grandmother   . Cancer Sister     uterine  . Hypertension Sister   . Hyperlipidemia Brother   . Hypertension Brother   . Kidney disease Maternal Grandfather     kidney failure  . Cancer Paternal Grandmother   . COPD Paternal Grandfather     History   Social History  . Marital Status: Married    Spouse Name: N/A    Number of Children: 2  . Years of Education: N/A   Occupational History  .     Social History Main Topics  . Smoking status: Never Smoker   . Smokeless tobacco: Never Used  . Alcohol Use: 6.0 oz/week    10 Glasses of wine per week  . Drug Use: No  . Sexual Activity: Not on file     Comment: lives with husband, no dietary restrictions   Other Topics Concern  . Not on file   Social History Narrative   Caffeine use:  2-3 cups coffee daily     Regular exercise:  Walks daily          Current Outpatient Prescriptions on File Prior to Visit  Medication Sig Dispense Refill  . alendronate (FOSAMAX) 70 MG tablet TAKE 1 TABLET  EVERY  7  DAYS.  TAKE  WITH  A  FULL  GLASS OF WATER ON AN EMPTY STOMACH. 12 tablet 0  . aspirin EC 81 MG tablet Take 81 mg by mouth daily.      . Calcium Carbonate-Vitamin D (CALCIUM-D) 600-400 MG-UNIT TABS Take 1 tablet by mouth daily.     . Cholecalciferol (VITAMIN D) 2000 UNITS CAPS Take 1 capsule by mouth daily.      . fexofenadine (ALLEGRA) 180 MG tablet Take 180 mg by mouth daily.    . fluticasone (FLONASE) 50 MCG/ACT nasal spray Place 2 sprays into both nostrils daily as needed for rhinitis or allergies. 48 g 0  . levothyroxine (SYNTHROID, LEVOTHROID) 125 MCG tablet Take 1 tablet (125 mcg total) by mouth daily. 90 tablet 0  . lisinopril (PRINIVIL,ZESTRIL) 10 MG tablet Take 1 tablet (10 mg total) by mouth daily. 90 tablet 0  . Omega-3 Fatty Acids (FISH OIL) 1200  MG CAPS Take 4 capsules by mouth daily.      Marland Kitchen omeprazole (PRILOSEC) 40 MG capsule Take 1 capsule (40 mg total) by mouth daily. 90 capsule 0   No current facility-administered medications on file prior to visit.    Allergies  Allergen Reactions  . Iodine Rash  . Mercurochrome [Merbromin] Rash  . Sulfa Antibiotics Rash    Review of Systems  Review of Systems  Constitutional: Negative for fever and malaise/fatigue.  HENT: Negative for congestion.   Eyes: Negative for discharge.  Respiratory: Negative for shortness of breath.   Cardiovascular: Negative for chest pain, palpitations and leg swelling.  Gastrointestinal: Negative for nausea, abdominal pain and diarrhea.  Genitourinary: Negative for dysuria.  Musculoskeletal: Negative for falls.  Skin: Negative for rash.  Neurological: Negative for loss of consciousness and headaches.  Endo/Heme/Allergies: Negative for polydipsia.  Psychiatric/Behavioral: Negative for depression and  suicidal ideas. The patient is not nervous/anxious and does not have insomnia.     Objective  BP 117/58 mmHg  Pulse 58  Temp(Src) 97.7 F (36.5 C) (Oral)  Ht 5' 1.5" (1.562 m)  Wt 156 lb 12.8 oz (71.124 kg)  BMI 29.15 kg/m2  SpO2 98%  Physical Exam  Physical Exam  Constitutional: She is oriented to person, place, and time and well-developed, well-nourished, and in no distress. No distress.  HENT:  Head: Normocephalic and atraumatic.  Eyes: Conjunctivae are normal.  Neck: Neck supple. No thyromegaly present.  Cardiovascular: Normal rate, regular rhythm and normal heart sounds.   No murmur heard. Pulmonary/Chest: Effort normal and breath sounds normal. She has no wheezes.  Abdominal: She exhibits no distension and no mass.  Musculoskeletal: She exhibits no edema.  Lymphadenopathy:    She has no cervical adenopathy.  Neurological: She is alert and oriented to person, place, and time.  Skin: Skin is warm and dry. No rash noted. She is not diaphoretic.  Psychiatric: Memory, affect and judgment normal.    Lab Results  Component Value Date   TSH 1.420 05/15/2013   Lab Results  Component Value Date   WBC 6.1 05/15/2013   HGB 13.9 05/15/2013   HCT 39.4 05/15/2013   MCV 92.7 05/15/2013   PLT 327 05/15/2013   Lab Results  Component Value Date   CREATININE 0.86 05/15/2013   BUN 20 05/15/2013   NA 135 05/15/2013   K 5.0 05/15/2013   CL 101 05/15/2013   CO2 26 05/15/2013   Lab Results  Component Value Date   ALT 29 05/15/2013   AST 28 05/15/2013   ALKPHOS 70 05/15/2013   BILITOT 0.7 05/15/2013   Lab Results  Component Value Date   CHOL 200 05/15/2013   Lab Results  Component Value Date   HDL 58 05/15/2013   Lab Results  Component Value Date   LDLCALC 126* 05/15/2013   Lab Results  Component Value Date   TRIG 78 05/15/2013   Lab Results  Component Value Date   CHOLHDL 3.4 05/15/2013     Assessment & Plan  Hypothyroidism On Levothyroxine,  continue to monitor  Hyperlipidemia Encouraged heart healthy diet, increase exercise, avoid trans fats, consider a krill oil cap daily  Hyperglycemia hgba1c acceptable, minimize simple carbs. Increase exercise as tolerated.   Osteoporosis Tolerating Fosamax, continue exercise and calcium with vitamin D

## 2014-07-30 NOTE — Assessment & Plan Note (Signed)
Encouraged heart healthy diet, increase exercise, avoid trans fats, consider a krill oil cap daily 

## 2014-07-30 NOTE — Assessment & Plan Note (Signed)
Tolerating Fosamax, continue exercise and calcium with vitamin D

## 2014-07-30 NOTE — Progress Notes (Signed)
Pre visit review using our clinic review tool, if applicable. No additional management support is needed unless otherwise documented below in the visit note. 

## 2014-07-30 NOTE — Patient Instructions (Signed)
DASH Eating Plan °DASH stands for "Dietary Approaches to Stop Hypertension." The DASH eating plan is a healthy eating plan that has been shown to reduce high blood pressure (hypertension). Additional health benefits may include reducing the risk of type 2 diabetes mellitus, heart disease, and stroke. The DASH eating plan may also help with weight loss. °WHAT DO I NEED TO KNOW ABOUT THE DASH EATING PLAN? °For the DASH eating plan, you will follow these general guidelines: °· Choose foods with a percent daily value for sodium of less than 5% (as listed on the food label). °· Use salt-free seasonings or herbs instead of table salt or sea salt. °· Check with your health care provider or pharmacist before using salt substitutes. °· Eat lower-sodium products, often labeled as "lower sodium" or "no salt added." °· Eat fresh foods. °· Eat more vegetables, fruits, and low-fat dairy products. °· Choose whole grains. Look for the word "whole" as the first word in the ingredient list. °· Choose fish and skinless chicken or turkey more often than red meat. Limit fish, poultry, and meat to 6 oz (170 g) each day. °· Limit sweets, desserts, sugars, and sugary drinks. °· Choose heart-healthy fats. °· Limit cheese to 1 oz (28 g) per day. °· Eat more home-cooked food and less restaurant, buffet, and fast food. °· Limit fried foods. °· Cook foods using methods other than frying. °· Limit canned vegetables. If you do use them, rinse them well to decrease the sodium. °· When eating at a restaurant, ask that your food be prepared with less salt, or no salt if possible. °WHAT FOODS CAN I EAT? °Seek help from a dietitian for individual calorie needs. °Grains °Whole grain or whole wheat bread. Brown rice. Whole grain or whole wheat pasta. Quinoa, bulgur, and whole grain cereals. Low-sodium cereals. Corn or whole wheat flour tortillas. Whole grain cornbread. Whole grain crackers. Low-sodium crackers. °Vegetables °Fresh or frozen vegetables  (raw, steamed, roasted, or grilled). Low-sodium or reduced-sodium tomato and vegetable juices. Low-sodium or reduced-sodium tomato sauce and paste. Low-sodium or reduced-sodium canned vegetables.  °Fruits °All fresh, canned (in natural juice), or frozen fruits. °Meat and Other Protein Products °Ground beef (85% or leaner), grass-fed beef, or beef trimmed of fat. Skinless chicken or turkey. Ground chicken or turkey. Pork trimmed of fat. All fish and seafood. Eggs. Dried beans, peas, or lentils. Unsalted nuts and seeds. Unsalted canned beans. °Dairy °Low-fat dairy products, such as skim or 1% milk, 2% or reduced-fat cheeses, low-fat ricotta or cottage cheese, or plain low-fat yogurt. Low-sodium or reduced-sodium cheeses. °Fats and Oils °Tub margarines without trans fats. Light or reduced-fat mayonnaise and salad dressings (reduced sodium). Avocado. Safflower, olive, or canola oils. Natural peanut or almond butter. °Other °Unsalted popcorn and pretzels. °The items listed above may not be a complete list of recommended foods or beverages. Contact your dietitian for more options. °WHAT FOODS ARE NOT RECOMMENDED? °Grains °White bread. White pasta. White rice. Refined cornbread. Bagels and croissants. Crackers that contain trans fat. °Vegetables °Creamed or fried vegetables. Vegetables in a cheese sauce. Regular canned vegetables. Regular canned tomato sauce and paste. Regular tomato and vegetable juices. °Fruits °Dried fruits. Canned fruit in light or heavy syrup. Fruit juice. °Meat and Other Protein Products °Fatty cuts of meat. Ribs, chicken wings, bacon, sausage, bologna, salami, chitterlings, fatback, hot dogs, bratwurst, and packaged luncheon meats. Salted nuts and seeds. Canned beans with salt. °Dairy °Whole or 2% milk, cream, half-and-half, and cream cheese. Whole-fat or sweetened yogurt. Full-fat   cheeses or blue cheese. Nondairy creamers and whipped toppings. Processed cheese, cheese spreads, or cheese  curds. °Condiments °Onion and garlic salt, seasoned salt, table salt, and sea salt. Canned and packaged gravies. Worcestershire sauce. Tartar sauce. Barbecue sauce. Teriyaki sauce. Soy sauce, including reduced sodium. Steak sauce. Fish sauce. Oyster sauce. Cocktail sauce. Horseradish. Ketchup and mustard. Meat flavorings and tenderizers. Bouillon cubes. Hot sauce. Tabasco sauce. Marinades. Taco seasonings. Relishes. °Fats and Oils °Butter, stick margarine, lard, shortening, ghee, and bacon fat. Coconut, palm kernel, or palm oils. Regular salad dressings. °Other °Pickles and olives. Salted popcorn and pretzels. °The items listed above may not be a complete list of foods and beverages to avoid. Contact your dietitian for more information. °WHERE CAN I FIND MORE INFORMATION? °National Heart, [employer], [employer] Institute: www.nhlbi.nih.gov/health/health-topics/topics/dash/ °Document Released: 08/06/2011 Document Revised: 01/01/2014 Document Reviewed: 06/21/2013 °Exit[employer]® Patient Information ©2015 [employer]. This information is not intended to replace advice given to you by your health care provider. Make sure you discuss any questions you have with your health care provider. ° °

## 2014-07-31 MED ORDER — ATORVASTATIN CALCIUM 10 MG PO TABS: 10.0000 mg | ORAL_TABLET | Freq: Every day | ORAL | Status: DC

## 2014-08-06 ENCOUNTER — Other Ambulatory Visit: Payer: Self-pay | Admitting: Family Medicine

## 2014-08-14 ENCOUNTER — Ambulatory Visit (INDEPENDENT_AMBULATORY_CARE_PROVIDER_SITE_OTHER): Payer: Medicare PPO | Admitting: Family Medicine

## 2014-08-14 ENCOUNTER — Encounter: Payer: Self-pay | Admitting: Family Medicine

## 2014-08-14 VITALS — BP 131/74 | HR 74 | Temp 98.8°F | Wt 152.0 lb

## 2014-08-14 DIAGNOSIS — I10 Essential (primary) hypertension: Secondary | ICD-10-CM

## 2014-08-14 DIAGNOSIS — R739 Hyperglycemia, unspecified: Secondary | ICD-10-CM

## 2014-08-14 DIAGNOSIS — E039 Hypothyroidism, unspecified: Secondary | ICD-10-CM

## 2014-08-14 DIAGNOSIS — E785 Hyperlipidemia, unspecified: Secondary | ICD-10-CM

## 2014-08-14 NOTE — Progress Notes (Signed)
Pre visit review using our clinic review tool, if applicable. No additional management support is needed unless otherwise documented below in the visit note. 

## 2014-08-14 NOTE — Patient Instructions (Signed)
Switch probiotics for a month, Michelle Jacobs's colon health or Digestive Advantage Add a fiber supplement such as Benefiber once to twice dialy  If diarrhea persists as you feel better then hold Lipitor for a week.  Cholesterol Cholesterol is a white, waxy, fat-like substance needed by your body in small amounts. The liver makes all the cholesterol you need. Cholesterol is carried from the liver by the blood through the blood vessels. Deposits of cholesterol (plaque) may build up on blood vessel walls. These make the arteries narrower and stiffer. Cholesterol plaques increase the risk for heart attack and stroke.  You cannot feel your cholesterol level even if it is very high. The only way to know it is high is with a blood test. Once you know your cholesterol levels, you should keep a record of the test results. Work with your health care provider to keep your levels in the desired range.  WHAT DO THE RESULTS MEAN?  Total cholesterol is a rough measure of all the cholesterol in your blood.   LDL is the so-called bad cholesterol. This is the type that deposits cholesterol in the walls of the arteries. You want this level to be low.   HDL is the good cholesterol because it cleans the arteries and carries the LDL away. You want this level to be high.  Triglycerides are fat that the body can either burn for energy or store. High levels are closely linked to heart disease.  WHAT ARE THE DESIRED LEVELS OF CHOLESTEROL?  Total cholesterol below 200.   LDL below 100 for people at risk, below 70 for those at very high risk.   HDL above 50 is good, above 60 is best.   Triglycerides below 150.  HOW CAN I LOWER MY CHOLESTEROL?  Diet. Follow your diet programs as directed by your health care provider.   Choose fish or white meat chicken and [place], roasted or baked. Limit fatty cuts of red meat, fried foods, and processed meats, such as sausage and lunch meats.   Eat lots of fresh fruits and  vegetables.  Choose whole grains, beans, pasta, potatoes, and cereals.   Use only small amounts of olive, corn, or canola oils.   Avoid butter, mayonnaise, shortening, or palm kernel oils.  Avoid foods with trans fats.   Drink skim or nonfat milk and eat low-fat or nonfat yogurt and cheeses. Avoid whole milk, cream, ice cream, egg yolks, and full-fat cheeses.   Healthy desserts include angel food cake, ginger snaps, animal crackers, hard candy, popsicles, and low-fat or nonfat frozen yogurt. Avoid pastries, cakes, pies, and cookies.   Exercise. Follow your exercise programs as directed by your health care provider.   A regular program helps decrease LDL and raise HDL.   A regular program helps with weight control.   Do things that increase your activity level like gardening, walking, or taking the stairs. Ask your health care provider about how you can be more active in your daily life.   Medicine. Take medicine only as directed by your health care provider.   Medicine may be prescribed by your health care provider to help lower cholesterol and decrease the risk for heart disease.   If you have several risk factors, you may need medicine even if your levels are normal. Document Released: 05/12/2001 Document Revised: 01/01/2014 Document Reviewed: 05/31/2013 [facility] Patient Information 2015 [place], [place]. This information is not intended to replace advice given to you by your health care provider. Make sure you discuss  any questions you have with your health care provider.  

## 2014-08-17 ENCOUNTER — Telehealth: Payer: Self-pay | Admitting: Family Medicine

## 2014-08-17 NOTE — Telephone Encounter (Signed)
Caller name: Julyana Relation to pt: self Call back number: [phone] Pharmacy: [facility]  Reason for call:   Not feeling any better since last visit. Diarrhea is gone but still have a low grade fever,sore throat, and coughing up mucous.

## 2014-08-17 NOTE — Telephone Encounter (Signed)
I would recommend Zinc 50 mg daily, Mucinex 600 mg twice a day x 10 days and call next week if no better

## 2014-08-20 ENCOUNTER — Encounter: Payer: Self-pay | Admitting: Family Medicine

## 2014-08-20 NOTE — Assessment & Plan Note (Signed)
On Levothyroxine, continue to monitor. Mild elevation of TSH at las t blood draw will monitor

## 2014-08-20 NOTE — Assessment & Plan Note (Signed)
minimize simple carbs. Increase exercise as tolerated.  

## 2014-08-20 NOTE — Assessment & Plan Note (Signed)
Tolerating statin, encouraged heart healthy diet, avoid trans fats, minimize simple carbs and saturated fats. Increase exercise as tolerated 

## 2014-08-20 NOTE — Progress Notes (Signed)
Michelle Jacobs  [id number] 01-Aug-1946 08/20/2014      Progress Note-Follow Up  Subjective  Chief Complaint  Chief Complaint  Patient presents with  . Diarrhea    all sxs started on Sat  . Fever  . Sore Throat    HPI  Patient is a 68 y.o. female in today for routine medical care. Patient is in today for follow-up. Feels fairly well. No recent illness. Denies polyuria or polydipsia. Denies CP/palp/SOB/HA/congestion/fevers/GI or GU c/o. Taking meds as prescribed  Past Medical History  Diagnosis Date  . History of chicken pox   . GERD (gastroesophageal reflux disease)     mild  . Allergy   . Hypertension   . Hyperlipidemia   . Personal history of colonic polyps 2009    Benign colon polyps  . Preventative health care 12/02/2011  . Hyperglycemia 05/20/2013  . Medicare annual wellness visit, subsequent 12/02/2011    History reviewed. No pertinent past surgical history.  Family History  Problem Relation Age of Onset  . Cancer Mother     lung  . Hypertension Mother   . Heart disease Father     CHF  . Diabetes Maternal Uncle     type II  . Cancer Maternal Grandmother 45    breast  . Heart disease Maternal Grandmother     MI, CHF  . Obesity Maternal Grandmother   . Cancer Sister     uterine  . Hypertension Sister   . Hyperlipidemia Brother   . Hypertension Brother   . Kidney disease Maternal Grandfather     kidney failure  . Cancer Paternal Grandmother   . COPD Paternal Grandfather     History   Social History  . Marital Status: Married    Spouse Name: N/A    Number of Children: 2  . Years of Education: N/A   Occupational History  .     Social History Main Topics  . Smoking status: Never Smoker   . Smokeless tobacco: Never Used  . Alcohol Use: 6.0 oz/week    10 Glasses of wine per week  . Drug Use: No  . Sexual Activity: Not on file     Comment: lives with husband, no dietary restrictions   Other Topics Concern  . Not on file   Social  History Narrative   Caffeine use:  2-3 cups coffee daily   Regular exercise:  Walks daily          Current Outpatient Prescriptions on File Prior to Visit  Medication Sig Dispense Refill  . alendronate (FOSAMAX) 70 MG tablet TAKE 1 TABLET  EVERY  7  DAYS.  TAKE  WITH  A  FULL  GLASS OF WATER ON AN EMPTY STOMACH. 12 tablet 0  . aspirin EC 81 MG tablet Take 81 mg by mouth daily.      Marland Kitchen atorvastatin (LIPITOR) 10 MG tablet Take 1 tablet (10 mg total) by mouth daily. 30 tablet 3  . Calcium Carbonate-Vitamin D (CALCIUM-D) 600-400 MG-UNIT TABS Take 1 tablet by mouth daily.     . Cholecalciferol (VITAMIN D) 2000 UNITS CAPS Take 1 capsule by mouth daily.      . fexofenadine (ALLEGRA) 180 MG tablet Take 180 mg by mouth daily.    . fluticasone (FLONASE) 50 MCG/ACT nasal spray Place 2 sprays into both nostrils daily as needed for rhinitis or allergies. 48 g 0  . levothyroxine (SYNTHROID, LEVOTHROID) 125 MCG tablet Take 1 tablet (125 mcg total) by mouth  daily. 90 tablet 0  . lisinopril (PRINIVIL,ZESTRIL) 10 MG tablet TAKE 1 TABLET EVERY DAY 90 tablet 0  . Omega-3 Fatty Acids (FISH OIL) 1200 MG CAPS Take 4 capsules by mouth daily.      Marland Kitchen omeprazole (PRILOSEC) 40 MG capsule TAKE 1 CAPSULE EVERY DAY 90 capsule 0   No current facility-administered medications on file prior to visit.    Allergies  Allergen Reactions  . Iodine Rash  . Mercurochrome [Merbromin] Rash  . Sulfa Antibiotics Rash    Review of Systems  Review of Systems  Constitutional: Negative for fever and malaise/fatigue.  HENT: Negative for congestion.   Eyes: Negative for discharge.  Respiratory: Negative for shortness of breath.   Cardiovascular: Negative for chest pain, palpitations and leg swelling.  Gastrointestinal: Negative for nausea, abdominal pain and diarrhea.  Genitourinary: Negative for dysuria.  Musculoskeletal: Negative for falls.  Skin: Negative for rash.  Neurological: Negative for loss of consciousness and  headaches.  Endo/Heme/Allergies: Negative for polydipsia.  Psychiatric/Behavioral: Negative for depression and suicidal ideas. The patient is not nervous/anxious and does not have insomnia.     Objective  BP 131/74 mmHg  Pulse 74  Temp(Src) 98.8 F (37.1 C) (Oral)  Wt 152 lb (68.947 kg)  SpO2 99%  Physical Exam  Physical Exam  Constitutional: She is oriented to person, place, and time and well-developed, well-nourished, and in no distress. No distress.  HENT:  Head: Normocephalic and atraumatic.  Eyes: Conjunctivae are normal.  Neck: Neck supple. No thyromegaly present.  Cardiovascular: Normal rate, regular rhythm and normal heart sounds.   Pulmonary/Chest: Effort normal and breath sounds normal. She has no wheezes.  Abdominal: She exhibits no distension and no mass.  Musculoskeletal: She exhibits no edema.  Lymphadenopathy:    She has no cervical adenopathy.  Neurological: She is alert and oriented to person, place, and time.  Skin: Skin is warm and dry. No rash noted. She is not diaphoretic.  Psychiatric: Memory, affect and judgment normal.    Lab Results  Component Value Date   TSH 5.48* 07/30/2014   Lab Results  Component Value Date   WBC 5.9 07/30/2014   HGB 13.0 07/30/2014   HCT 38.6 07/30/2014   MCV 93.6 07/30/2014   PLT 325.0 07/30/2014   Lab Results  Component Value Date   CREATININE 0.9 07/30/2014   BUN 13 07/30/2014   NA 134* 07/30/2014   K 3.8 07/30/2014   CL 102 07/30/2014   CO2 24 07/30/2014   Lab Results  Component Value Date   ALT 37* 07/30/2014   AST 34 07/30/2014   ALKPHOS 59 07/30/2014   BILITOT 0.8 07/30/2014   Lab Results  Component Value Date   CHOL 255* 07/30/2014   Lab Results  Component Value Date   HDL 55.90 07/30/2014   Lab Results  Component Value Date   LDLCALC 169* 07/30/2014   Lab Results  Component Value Date   TRIG 153.0* 07/30/2014   Lab Results  Component Value Date   CHOLHDL 5 07/30/2014      Assessment & Plan  HTN (hypertension) Well controlled, no changes to meds. Encouraged heart healthy diet such as the DASH diet and exercise as tolerated.   Hypothyroidism On Levothyroxine, continue to monitor. Mild elevation of TSH at las t blood draw will monitor  Hyperlipidemia Tolerating statin, encouraged heart healthy diet, avoid trans fats, minimize simple carbs and saturated fats. Increase exercise as tolerated  Hyperglycemia  minimize simple carbs. Increase exercise as tolerated.

## 2014-08-20 NOTE — Assessment & Plan Note (Signed)
Well controlled, no changes to meds. Encouraged heart healthy diet such as the DASH diet and exercise as tolerated.  °

## 2014-08-20 NOTE — Telephone Encounter (Signed)
Left a message for call back.  

## 2014-08-22 NOTE — Telephone Encounter (Signed)
Informed patient of what Dr. Charlett Blake states

## 2014-08-22 NOTE — Telephone Encounter (Signed)
Left a message for call back.  

## 2014-09-26 DIAGNOSIS — Z09 Encounter for follow-up examination after completed treatment for conditions other than malignant neoplasm: Secondary | ICD-10-CM | POA: Diagnosis not present

## 2014-09-26 DIAGNOSIS — Z86010 Personal history of colonic polyps: Secondary | ICD-10-CM | POA: Diagnosis not present

## 2014-09-26 LAB — HM COLONOSCOPY

## 2014-10-23 ENCOUNTER — Ambulatory Visit: Payer: Medicare PPO | Admitting: Family Medicine

## 2014-10-26 ENCOUNTER — Encounter: Payer: Self-pay | Admitting: Family Medicine

## 2014-10-26 ENCOUNTER — Ambulatory Visit (INDEPENDENT_AMBULATORY_CARE_PROVIDER_SITE_OTHER): Payer: Medicare PPO | Admitting: Family Medicine

## 2014-10-26 VITALS — BP 122/72 | HR 70 | Temp 98.3°F | Ht 63.0 in | Wt 153.1 lb

## 2014-10-26 DIAGNOSIS — I10 Essential (primary) hypertension: Secondary | ICD-10-CM

## 2014-10-26 DIAGNOSIS — E039 Hypothyroidism, unspecified: Secondary | ICD-10-CM

## 2014-10-26 DIAGNOSIS — E782 Mixed hyperlipidemia: Secondary | ICD-10-CM

## 2014-10-26 DIAGNOSIS — R739 Hyperglycemia, unspecified: Secondary | ICD-10-CM | POA: Diagnosis not present

## 2014-10-26 NOTE — Patient Instructions (Signed)
Needs lab appointments for next week and in July

## 2014-10-26 NOTE — Progress Notes (Signed)
Pre visit review using our clinic review tool, if applicable. No additional management support is needed unless otherwise documented below in the visit note. 

## 2014-10-29 ENCOUNTER — Other Ambulatory Visit: Payer: Self-pay | Admitting: Family Medicine

## 2014-10-29 NOTE — Progress Notes (Signed)
Michelle Jacobs [id number] 1946-03-14 10/29/2014      Progress Note New Patient  Subjective  Chief Complaint  Chief Complaint  Patient presents with  . Follow-up    HPI  Patient is a 69 year old female in today for routine medical care. In today for follow-up. Feeling well. Trying to maintain a heart healthy diet and minimize carbohydrate. No recent illness. Denies CP/palp/SOB/HA/congestion/fevers/GI or GU c/o. Taking meds as prescribed  Past Medical History  Diagnosis Date  . History of chicken pox   . GERD (gastroesophageal reflux disease)     mild  . Allergy   . Hypertension   . Hyperlipidemia   . Personal history of colonic polyps 2009    Benign colon polyps  . Preventative health care 12/02/2011  . Hyperglycemia 05/20/2013  . Medicare annual wellness visit, subsequent 12/02/2011    No past surgical history on file.  Family History  Problem Relation Age of Onset  . Cancer Mother     lung  . Hypertension Mother   . Heart disease Father     CHF  . Diabetes Maternal Uncle     type II  . Cancer Maternal Grandmother 83    breast  . Heart disease Maternal Grandmother     MI, CHF  . Obesity Maternal Grandmother   . Cancer Sister     uterine  . Hypertension Sister   . Hyperlipidemia Brother   . Hypertension Brother   . Kidney disease Maternal Grandfather     kidney failure  . Cancer Paternal Grandmother   . COPD Paternal Grandfather     History   Social History  . Marital Status: Married    Spouse Name: N/A  . Number of Children: 2  . Years of Education: N/A   Occupational History  .     Social History Main Topics  . Smoking status: Never Smoker   . Smokeless tobacco: Never Used  . Alcohol Use: 6.0 oz/week    10 Glasses of wine per week  . Drug Use: No  . Sexual Activity: Not on file     Comment: lives with husband, no dietary restrictions   Other Topics Concern  . Not on file   Social History Narrative   Caffeine use:  2-3 cups coffee  daily   Regular exercise:  Walks daily          Current Outpatient Prescriptions on File Prior to Visit  Medication Sig Dispense Refill  . aspirin EC 81 MG tablet Take 81 mg by mouth daily.      . Calcium Carbonate-Vitamin D (CALCIUM-D) 600-400 MG-UNIT TABS Take 1 tablet by mouth daily.     . Cholecalciferol (VITAMIN D) 2000 UNITS CAPS Take 1 capsule by mouth daily.      . fexofenadine (ALLEGRA) 180 MG tablet Take 180 mg by mouth daily.    . fluticasone (FLONASE) 50 MCG/ACT nasal spray Place 2 sprays into both nostrils daily as needed for rhinitis or allergies. 48 g 0  . levothyroxine (SYNTHROID, LEVOTHROID) 125 MCG tablet Take 1 tablet (125 mcg total) by mouth daily. 90 tablet 0  . Omega-3 Fatty Acids (FISH OIL) 1200 MG CAPS Take 4 capsules by mouth daily.       No current facility-administered medications on file prior to visit.    Allergies  Allergen Reactions  . Iodine Rash  . Mercurochrome [Merbromin] Rash  . Sulfa Antibiotics Rash    Review of Systems  Review of Systems  Constitutional:  Negative for fever and malaise/fatigue.  HENT: Negative for congestion.   Eyes: Negative for discharge.  Respiratory: Negative for shortness of breath.   Cardiovascular: Negative for chest pain, palpitations and leg swelling.  Gastrointestinal: Negative for nausea, abdominal pain and diarrhea.  Genitourinary: Negative for dysuria.  Musculoskeletal: Negative for falls.  Skin: Negative for rash.  Neurological: Negative for loss of consciousness and headaches.  Endo/Heme/Allergies: Negative for polydipsia.  Psychiatric/Behavioral: Negative for depression and suicidal ideas. The patient is not nervous/anxious and does not have insomnia.     Objective  BP 122/72 mmHg  Pulse 70  Temp(Src) 98.3 F (36.8 C) (Oral)  Ht 5\' 3"  (1.6 m)  Wt 153 lb 2 oz (69.457 kg)  BMI 27.13 kg/m2  SpO2 96%  Physical Exam  Physical Exam  Constitutional: She is oriented to person, place, and time and  well-developed, well-nourished, and in no distress. No distress.  HENT:  Head: Normocephalic and atraumatic.  Eyes: Conjunctivae are normal.  Neck: Neck supple. No thyromegaly present.  Cardiovascular: Normal rate, regular rhythm and normal heart sounds.   No murmur heard. Pulmonary/Chest: Effort normal and breath sounds normal. She has no wheezes.  Abdominal: She exhibits no distension and no mass.  Musculoskeletal: She exhibits no edema.  Lymphadenopathy:    She has no cervical adenopathy.  Neurological: She is alert and oriented to person, place, and time.  Skin: Skin is warm and dry. No rash noted. She is not diaphoretic.  Psychiatric: Memory, affect and judgment normal.       Assessment & Plan  HTN (hypertension) Well controlled, no changes to meds. Encouraged heart healthy diet such as the DASH diet and exercise as tolerated.    Hypothyroidism On Levothyroxine, continue to monitor, TSH mildly elevated will repeat next week   Hyperglycemia minimize simple carbs. Increase exercise as tolerated.

## 2014-10-29 NOTE — Assessment & Plan Note (Signed)
Well controlled, no changes to meds. Encouraged heart healthy diet such as the DASH diet and exercise as tolerated.  °

## 2014-10-29 NOTE — Assessment & Plan Note (Addendum)
On Levothyroxine, continue to monitor, TSH mildly elevated will repeat next week

## 2014-10-29 NOTE — Assessment & Plan Note (Signed)
minimize simple carbs. Increase exercise as tolerated.  

## 2014-10-30 ENCOUNTER — Other Ambulatory Visit (INDEPENDENT_AMBULATORY_CARE_PROVIDER_SITE_OTHER): Payer: Commercial Managed Care - HMO

## 2014-10-30 DIAGNOSIS — E782 Mixed hyperlipidemia: Secondary | ICD-10-CM | POA: Diagnosis not present

## 2014-10-30 DIAGNOSIS — I10 Essential (primary) hypertension: Secondary | ICD-10-CM

## 2014-10-30 LAB — COMPREHENSIVE METABOLIC PANEL
ALT: 27 U/L (ref 0–35)
AST: 24 U/L (ref 0–37)
Albumin: 4.6 g/dL (ref 3.5–5.2)
Alkaline Phosphatase: 58 U/L (ref 39–117)
BILIRUBIN TOTAL: 1 mg/dL (ref 0.2–1.2)
BUN: 15 mg/dL (ref 6–23)
CHLORIDE: 102 meq/L (ref 96–112)
CO2: 27 mEq/L (ref 19–32)
CREATININE: 0.74 mg/dL (ref 0.40–1.20)
Calcium: 9.8 mg/dL (ref 8.4–10.5)
GFR: 83.09 mL/min (ref >60.00)
Glucose, Bld: 97 mg/dL (ref 70–99)
Potassium: 4.1 mEq/L (ref 3.5–5.1)
Sodium: 135 mEq/L (ref 135–145)
Total Protein: 7.3 g/dL (ref 6.0–8.3)

## 2014-10-30 LAB — LIPID PANEL
CHOLESTEROL: 136 mg/dL (ref 0–200)
HDL: 53.6 mg/dL (ref >39.00)
LDL Cholesterol: 59 mg/dL (ref 0–99)
NonHDL: 82.4
Total CHOL/HDL Ratio: 3
Triglycerides: 115 mg/dL (ref 0.0–149.0)
VLDL: 23 mg/dL (ref 0.0–40.0)

## 2014-10-30 LAB — TSH: TSH: 4.16 u[IU]/mL (ref 0.35–4.50)

## 2014-11-26 ENCOUNTER — Other Ambulatory Visit: Payer: Self-pay | Admitting: Family Medicine

## 2015-02-19 ENCOUNTER — Other Ambulatory Visit (INDEPENDENT_AMBULATORY_CARE_PROVIDER_SITE_OTHER): Payer: Commercial Managed Care - HMO

## 2015-02-19 DIAGNOSIS — I10 Essential (primary) hypertension: Secondary | ICD-10-CM

## 2015-02-19 DIAGNOSIS — E785 Hyperlipidemia, unspecified: Secondary | ICD-10-CM | POA: Diagnosis not present

## 2015-02-19 LAB — HEPATIC FUNCTION PANEL
ALBUMIN: 4.4 g/dL (ref 3.5–5.2)
ALK PHOS: 54 U/L (ref 39–117)
ALT: 21 U/L (ref 0–35)
AST: 22 U/L (ref 0–37)
BILIRUBIN DIRECT: 0.2 mg/dL (ref 0.0–0.3)
BILIRUBIN TOTAL: 0.9 mg/dL (ref 0.2–1.2)
Total Protein: 7.1 g/dL (ref 6.0–8.3)

## 2015-02-19 LAB — RENAL FUNCTION PANEL
Albumin: 4.4 g/dL (ref 3.5–5.2)
BUN: 12 mg/dL (ref 6–23)
CALCIUM: 9.8 mg/dL (ref 8.4–10.5)
CHLORIDE: 104 meq/L (ref 96–112)
CO2: 25 mEq/L (ref 19–32)
CREATININE: 0.78 mg/dL (ref 0.40–1.20)
GFR: 78.12 mL/min (ref >60.00)
Glucose, Bld: 91 mg/dL (ref 70–99)
Phosphorus: 3.5 mg/dL (ref 2.3–4.6)
Potassium: 4.2 mEq/L (ref 3.5–5.1)
Sodium: 136 mEq/L (ref 135–145)

## 2015-02-19 LAB — LIPID PANEL
CHOL/HDL RATIO: 2
Cholesterol: 133 mg/dL (ref 0–200)
HDL: 55.7 mg/dL (ref >39.00)
LDL Cholesterol: 59 mg/dL (ref 0–99)
NONHDL: 77.3
TRIGLYCERIDES: 90 mg/dL (ref 0.0–149.0)
VLDL: 18 mg/dL (ref 0.0–40.0)

## 2015-02-19 LAB — CBC
HCT: 37.7 % (ref 36.0–46.0)
Hemoglobin: 12.9 g/dL (ref 12.0–15.0)
MCHC: 34.2 g/dL (ref 30.0–36.0)
MCV: 93.7 fl (ref 78.0–100.0)
Platelets: 306 10*3/uL (ref 150.0–400.0)
RBC: 4.02 Mil/uL (ref 3.87–5.11)
RDW: 13 % (ref 11.5–15.5)
WBC: 5.5 10*3/uL (ref 4.0–10.5)

## 2015-02-19 LAB — TSH: TSH: 3.53 u[IU]/mL (ref 0.35–4.50)

## 2015-02-28 ENCOUNTER — Encounter: Payer: Medicare PPO | Admitting: Family Medicine

## 2015-03-05 ENCOUNTER — Telehealth: Payer: Self-pay | Admitting: Family Medicine

## 2015-03-05 NOTE — Telephone Encounter (Signed)
pre visit letter mailed 03/05/15 °

## 2015-03-26 ENCOUNTER — Encounter: Payer: Medicare PPO | Admitting: Family Medicine

## 2015-04-08 ENCOUNTER — Telehealth: Payer: Self-pay | Admitting: Family Medicine

## 2015-04-08 NOTE — Telephone Encounter (Signed)
Advise last tdap in our records 09/03/11

## 2015-04-08 NOTE — Telephone Encounter (Signed)
Pt would like to know if she needs any shots/vaccines before being around newborn baby in the family. Please call her at [phone].

## 2015-04-08 NOTE — Telephone Encounter (Signed)
No her tdap in 2013 is current enough

## 2015-04-09 NOTE — Telephone Encounter (Signed)
Called the patient left a detailed message of PCP response to vaccine.

## 2015-04-16 ENCOUNTER — Ambulatory Visit: Payer: Commercial Managed Care - HMO | Admitting: Family Medicine

## 2015-04-22 ENCOUNTER — Other Ambulatory Visit: Payer: Self-pay | Admitting: Family Medicine

## 2015-09-16 ENCOUNTER — Other Ambulatory Visit: Payer: Self-pay | Admitting: Family Medicine

## 2015-09-23 ENCOUNTER — Ambulatory Visit (INDEPENDENT_AMBULATORY_CARE_PROVIDER_SITE_OTHER): Payer: Commercial Managed Care - HMO | Admitting: Family Medicine

## 2015-09-23 ENCOUNTER — Encounter: Payer: Self-pay | Admitting: Family Medicine

## 2015-09-23 VITALS — BP 132/60 | HR 60 | Temp 98.6°F | Ht 62.0 in | Wt 152.4 lb

## 2015-09-23 DIAGNOSIS — E559 Vitamin D deficiency, unspecified: Secondary | ICD-10-CM

## 2015-09-23 DIAGNOSIS — I10 Essential (primary) hypertension: Secondary | ICD-10-CM

## 2015-09-23 DIAGNOSIS — K635 Polyp of colon: Secondary | ICD-10-CM

## 2015-09-23 DIAGNOSIS — Z23 Encounter for immunization: Secondary | ICD-10-CM

## 2015-09-23 DIAGNOSIS — E785 Hyperlipidemia, unspecified: Secondary | ICD-10-CM | POA: Diagnosis not present

## 2015-09-23 DIAGNOSIS — M810 Age-related osteoporosis without current pathological fracture: Secondary | ICD-10-CM

## 2015-09-23 DIAGNOSIS — Z0000 Encounter for general adult medical examination without abnormal findings: Secondary | ICD-10-CM | POA: Diagnosis not present

## 2015-09-23 DIAGNOSIS — Z1159 Encounter for screening for other viral diseases: Secondary | ICD-10-CM | POA: Diagnosis not present

## 2015-09-23 DIAGNOSIS — R739 Hyperglycemia, unspecified: Secondary | ICD-10-CM | POA: Diagnosis not present

## 2015-09-23 DIAGNOSIS — E039 Hypothyroidism, unspecified: Secondary | ICD-10-CM

## 2015-09-23 LAB — CBC
HEMATOCRIT: 41.8 % (ref 36.0–46.0)
Hemoglobin: 13.9 g/dL (ref 12.0–15.0)
MCHC: 33.2 g/dL (ref 30.0–36.0)
MCV: 94.4 fl (ref 78.0–100.0)
Platelets: 338 10*3/uL (ref 150.0–400.0)
RBC: 4.43 Mil/uL (ref 3.87–5.11)
RDW: 12.9 % (ref 11.5–15.5)
WBC: 6.5 10*3/uL (ref 4.0–10.5)

## 2015-09-23 LAB — VITAMIN D 25 HYDROXY (VIT D DEFICIENCY, FRACTURES): VITD: 37.63 ng/mL (ref 30.00–100.00)

## 2015-09-23 LAB — LIPID PANEL
CHOLESTEROL: 135 mg/dL (ref 0–200)
HDL: 66 mg/dL (ref >39.00)
LDL Cholesterol: 45 mg/dL (ref 0–99)
NonHDL: 68.53
Total CHOL/HDL Ratio: 2
Triglycerides: 118 mg/dL (ref 0.0–149.0)
VLDL: 23.6 mg/dL (ref 0.0–40.0)

## 2015-09-23 LAB — HEPATITIS C ANTIBODY: HCV AB: NEGATIVE

## 2015-09-23 LAB — TSH: TSH: 2.73 u[IU]/mL (ref 0.35–4.50)

## 2015-09-23 MED ORDER — LEVOTHYROXINE SODIUM 125 MCG PO TABS: 125.0000 ug | ORAL_TABLET | Freq: Every day | ORAL | Status: DC

## 2015-09-23 MED ORDER — ATORVASTATIN CALCIUM 10 MG PO TABS: 10.0000 mg | ORAL_TABLET | Freq: Every day | ORAL | Status: DC

## 2015-09-23 MED ORDER — LISINOPRIL 10 MG PO TABS: 10.0000 mg | ORAL_TABLET | Freq: Every day | ORAL | Status: DC

## 2015-09-23 MED ORDER — ALENDRONATE SODIUM 70 MG PO TABS: ORAL_TABLET | ORAL | Status: DC

## 2015-09-23 NOTE — Progress Notes (Signed)
Pre visit review using our clinic review tool, if applicable. No additional management support is needed unless otherwise documented below in the visit note. 

## 2015-09-23 NOTE — Progress Notes (Signed)
Subjective:    Patient ID: Michelle Jacobs, female    DOB: 1946/06/10, 70 y.o.   MRN: [id number]  Chief Complaint  Patient presents with  . Annual Exam    HPI Patient is in today for annual exam and follow up on numerous concerns. No recent hospitalization. Is feeling well today. Is trying to stay active but has been intermittently maintaining a heart healthy diet. No difficulty with ADLs at home. No falls or new concerns. Denies CP/palp/SOB/HA/congestion/fevers/GI or GU c/o. Taking meds as prescribed  Past Medical History  Diagnosis Date  . History of chicken pox   . GERD (gastroesophageal reflux disease)     mild  . Allergy   . Hypertension   . Hyperlipidemia   . Personal history of colonic polyps 2009    Benign colon polyps  . Preventative health care 12/02/2011  . Hyperglycemia 05/20/2013  . Medicare annual wellness visit, subsequent 12/02/2011  . Annual physical exam 10/06/2015    History reviewed. No pertinent past surgical history.  Family History  Problem Relation Age of Onset  . Cancer Mother     lung  . Hypertension Mother   . Heart disease Father     CHF  . Diabetes Maternal Uncle     type II  . Heart disease Maternal Uncle     s/p CABG  . Cancer Maternal Grandmother 26    breast  . Heart disease Maternal Grandmother     MI, CHF  . Obesity Maternal Grandmother   . Cancer Sister     uterine  . Hypertension Sister   . Hyperlipidemia Brother   . Hypertension Brother   . Kidney disease Maternal Grandfather     kidney failure  . Cancer Paternal Grandmother   . COPD Paternal Grandfather   . Diabetes Maternal Aunt     older, type 2    Social History   Social History  . Marital Status: Married    Spouse Name: N/A  . Number of Children: 2  . Years of Education: N/A   Occupational History  .     Social History Main Topics  . Smoking status: Never Smoker   . Smokeless tobacco: Never Used  . Alcohol Use: 6.0 oz/week    10 Glasses of wine per  week  . Drug Use: No  . Sexual Activity: Not on file     Comment: lives with husband, no dietary restrictions   Other Topics Concern  . Not on file   Social History Narrative   Caffeine use:  2-3 cups coffee daily   Regular exercise:  Walks daily          Outpatient Prescriptions Prior to Visit  Medication Sig Dispense Refill  . aspirin EC 81 MG tablet Take 81 mg by mouth daily.      . Calcium Carbonate-Vitamin D (CALCIUM-D) 600-400 MG-UNIT TABS Take 1 tablet by mouth daily.     . fluticasone (FLONASE) 50 MCG/ACT nasal spray Place 2 sprays into both nostrils daily as needed for rhinitis or allergies. 48 g 0  . Omega-3 Fatty Acids (FISH OIL) 1200 MG CAPS Take 4 capsules by mouth daily.      Marland Kitchen alendronate (FOSAMAX) 70 MG tablet TAKE 1 TABLET  EVERY  7  DAYS.  TAKE  WITH  A  FULL  GLASS OF WATER ON AN EMPTY STOMACH. 12 tablet 3  . atorvastatin (LIPITOR) 10 MG tablet TAKE 1 TABLET EVERY DAY 90 tablet 3  . levothyroxine (  SYNTHROID, LEVOTHROID) 125 MCG tablet TAKE 1 TABLET EVERY DAY 90 tablet 0  . lisinopril (PRINIVIL,ZESTRIL) 10 MG tablet TAKE 1 TABLET EVERY DAY 90 tablet 3  . Cholecalciferol (VITAMIN D) 2000 UNITS CAPS Take 1 capsule by mouth daily.      . fexofenadine (ALLEGRA) 180 MG tablet Take 180 mg by mouth daily.    Marland Kitchen omeprazole (PRILOSEC) 40 MG capsule TAKE 1 CAPSULE EVERY DAY 90 capsule 3   No facility-administered medications prior to visit.    Allergies  Allergen Reactions  . Iodine Rash  . Mercurochrome [Merbromin] Rash  . Sulfa Antibiotics Rash    Review of Systems  Constitutional: Negative for fever, chills and malaise/fatigue.  HENT: Negative for congestion and hearing loss.   Eyes: Negative for discharge.  Respiratory: Negative for cough, sputum production and shortness of breath.   Cardiovascular: Negative for chest pain, palpitations and leg swelling.  Gastrointestinal: Negative for heartburn, nausea, vomiting, abdominal pain, diarrhea, constipation and  blood in stool.  Genitourinary: Negative for dysuria, urgency, frequency and hematuria.  Musculoskeletal: Negative for myalgias, back pain and falls.  Skin: Negative for rash.  Neurological: Negative for dizziness, sensory change, loss of consciousness, weakness and headaches.  Endo/Heme/Allergies: Negative for environmental allergies. Does not bruise/bleed easily.  Psychiatric/Behavioral: Negative for depression and suicidal ideas. The patient is not nervous/anxious and does not have insomnia.        Objective:    Physical Exam  Constitutional: She is oriented to person, place, and time. She appears well-developed and well-nourished. No distress.  HENT:  Head: Normocephalic and atraumatic.  Eyes: Conjunctivae are normal.  Neck: Neck supple. No thyromegaly present.  Cardiovascular: Normal rate, regular rhythm and normal heart sounds.   No murmur heard. Pulmonary/Chest: Effort normal and breath sounds normal. No respiratory distress.  Abdominal: Soft. Bowel sounds are normal. She exhibits no distension and no mass. There is no tenderness.  Musculoskeletal: She exhibits no edema.  Lymphadenopathy:    She has no cervical adenopathy.  Neurological: She is alert and oriented to person, place, and time.  Skin: Skin is warm and dry.  Psychiatric: She has a normal mood and affect. Her behavior is normal.    BP 132/60 mmHg  Pulse 60  Temp(Src) 98.6 F (37 C) (Oral)  Ht 5\' 2"  (1.575 m)  Wt 152 lb 6 oz (69.117 kg)  BMI 27.86 kg/m2  SpO2 99% Wt Readings from Last 3 Encounters:  09/23/15 152 lb 6 oz (69.117 kg)  10/26/14 153 lb 2 oz (69.457 kg)  08/14/14 152 lb (68.947 kg)     Lab Results  Component Value Date   WBC 6.5 09/23/2015   HGB 13.9 09/23/2015   HCT 41.8 09/23/2015   PLT 338.0 09/23/2015   GLUCOSE 91 02/19/2015   CHOL 135 09/23/2015   TRIG 118.0 09/23/2015   HDL 66.00 09/23/2015   LDLCALC 45 09/23/2015   ALT 21 02/19/2015   AST 22 02/19/2015   NA 136 02/19/2015     K 4.2 02/19/2015   CL 104 02/19/2015   CREATININE 0.78 02/19/2015   BUN 12 02/19/2015   CO2 25 02/19/2015   TSH 2.73 09/23/2015   HGBA1C 5.1 07/30/2014    Lab Results  Component Value Date   TSH 2.73 09/23/2015   Lab Results  Component Value Date   WBC 6.5 09/23/2015   HGB 13.9 09/23/2015   HCT 41.8 09/23/2015   MCV 94.4 09/23/2015   PLT 338.0 09/23/2015   Lab Results  Component  Value Date   NA 136 02/19/2015   K 4.2 02/19/2015   CO2 25 02/19/2015   GLUCOSE 91 02/19/2015   BUN 12 02/19/2015   CREATININE 0.78 02/19/2015   BILITOT 0.9 02/19/2015   ALKPHOS 54 02/19/2015   AST 22 02/19/2015   ALT 21 02/19/2015   PROT 7.1 02/19/2015   ALBUMIN 4.4 02/19/2015   ALBUMIN 4.4 02/19/2015   CALCIUM 9.8 02/19/2015   GFR 78.12 02/19/2015   Lab Results  Component Value Date   CHOL 135 09/23/2015   Lab Results  Component Value Date   HDL 66.00 09/23/2015   Lab Results  Component Value Date   LDLCALC 45 09/23/2015   Lab Results  Component Value Date   TRIG 118.0 09/23/2015   Lab Results  Component Value Date   CHOLHDL 2 09/23/2015   Lab Results  Component Value Date   HGBA1C 5.1 07/30/2014       Assessment & Plan:   Problem List Items Addressed This Visit    Annual physical exam    Patient denies any difficulties at home. No trouble with ADLs, depression or falls. See EMR for functional status screen and depression screen. No recent changes to vision or hearing. Is UTD with immunizations. Is UTD with screening. Discussed Advanced Directives. Encouraged heart healthy diet, exercise as tolerated and adequate sleep. See patient's problem list for health risk factors to monitor. See AVS for preventative healthcare recommendation schedule. Labs ordered and reviewed. Hep C antibody is negative Pneumovax given today Colonoscopy in 2010 MGM, Dexa scan in 2015      Colon polyps   HTN (hypertension)    Well controlled, no changes to meds. Encouraged heart  healthy diet such as the DASH diet and exercise as tolerated.       Relevant Medications   atorvastatin (LIPITOR) 10 MG tablet   lisinopril (PRINIVIL,ZESTRIL) 10 MG tablet   Other Relevant Orders   CBC (Completed)   Hyperglycemia    hgba1c acceptable, minimize simple carbs. Increase exercise as tolerated.       Hyperlipidemia    Tolerating statin, encouraged heart healthy diet, avoid trans fats, minimize simple carbs and saturated fats. Increase exercise as tolerated      Relevant Medications   atorvastatin (LIPITOR) 10 MG tablet   lisinopril (PRINIVIL,ZESTRIL) 10 MG tablet   Other Relevant Orders   Lipid panel (Completed)   Lipid panel   Hypothyroidism    On Levothyroxine, continue to monitor      Relevant Medications   levothyroxine (SYNTHROID, LEVOTHROID) 125 MCG tablet   Other Relevant Orders   TSH (Completed)   TSH   Osteoporosis    Vitamin D level WNL, continue calcium and vitamin D supplements, stay active.       Relevant Medications   alendronate (FOSAMAX) 70 MG tablet    Other Visit Diagnoses    Encounter for immunization    -  Primary    Vitamin D deficiency        Relevant Orders    VITAMIN D 25 Hydroxy (Vit-D Deficiency, Fractures) (Completed)    Need for hepatitis C screening test        Relevant Orders    Hepatitis C antibody (Completed)    Need for 23-polyvalent pneumococcal polysaccharide vaccine        Relevant Orders    Pneumococcal polysaccharide vaccine 23-valent greater than or equal to 2yo subcutaneous/IM (Completed)       I have discontinued Ms. Dorado's Vitamin D, fexofenadine, and  omeprazole. I have also changed her atorvastatin, levothyroxine, and lisinopril. Additionally, I am having her maintain her Calcium-D, aspirin EC, Fish Oil, fluticasone, cetirizine, and alendronate.  Meds ordered this encounter  Medications  . cetirizine (ZYRTEC) 10 MG tablet    Sig: Take 10 mg by mouth daily.  Marland Kitchen atorvastatin (LIPITOR) 10 MG tablet     Sig: Take 1 tablet (10 mg total) by mouth daily.    Dispense:  90 tablet    Refill:  3  . levothyroxine (SYNTHROID, LEVOTHROID) 125 MCG tablet    Sig: Take 1 tablet (125 mcg total) by mouth daily.    Dispense:  90 tablet    Refill:  1  . lisinopril (PRINIVIL,ZESTRIL) 10 MG tablet    Sig: Take 1 tablet (10 mg total) by mouth daily.    Dispense:  90 tablet    Refill:  3  . alendronate (FOSAMAX) 70 MG tablet    Sig: TAKE 1 TABLET  EVERY  7  DAYS.  TAKE  WITH  A  FULL  GLASS OF WATER ON AN EMPTY STOMACH.    Dispense:  12 tablet    Refill:  3     Willette Alma, MD

## 2015-09-23 NOTE — Patient Instructions (Signed)
Preventive Care for Adults, Female A healthy lifestyle and preventive care can promote health and wellness. Preventive health guidelines for women include the following key practices.  A routine yearly physical is a good way to check with your health care provider about your health and preventive screening. It is a chance to share any concerns and updates on your health and to receive a thorough exam.  Visit your dentist for a routine exam and preventive care every 6 months. Brush your teeth twice a day and floss once a day. Good oral hygiene prevents tooth decay and gum disease.  The frequency of eye exams is based on your age, health, family medical history, use of contact lenses, and other factors. Follow your health care provider's recommendations for frequency of eye exams.  Eat a healthy diet. Foods like vegetables, fruits, whole grains, low-fat dairy products, and lean protein foods contain the nutrients you need without too many calories. Decrease your intake of foods high in solid fats, added sugars, and salt. Eat the right amount of calories for you.Get information about a proper diet from your health care provider, if necessary.  Regular physical exercise is one of the most important things you can do for your health. Most adults should get at least 150 minutes of moderate-intensity exercise (any activity that increases your heart rate and causes you to sweat) each week. In addition, most adults need muscle-strengthening exercises on 2 or more days a week.  Maintain a healthy weight. The body mass index (BMI) is a screening tool to identify possible weight problems. It provides an estimate of body fat based on height and weight. Your health care provider can find your BMI and can help you achieve or maintain a healthy weight.For adults 20 years and older:  A BMI below 18.5 is considered underweight.  A BMI of 18.5 to 24.9 is normal.  A BMI of 25 to 29.9 is considered overweight.  A  BMI of 30 and above is considered obese.  Maintain normal blood lipids and cholesterol levels by exercising and minimizing your intake of saturated fat. Eat a balanced diet with plenty of fruit and vegetables. Blood tests for lipids and cholesterol should begin at age 20 and be repeated every 5 years. If your lipid or cholesterol levels are high, you are over 50, or you are at high risk for heart disease, you may need your cholesterol levels checked more frequently.Ongoing high lipid and cholesterol levels should be treated with medicines if diet and exercise are not working.  If you smoke, find out from your health care provider how to quit. If you do not use tobacco, do not start.  Lung cancer screening is recommended for adults aged 55-80 years who are at high risk for developing lung cancer because of a history of smoking. A yearly low-dose CT scan of the lungs is recommended for people who have at least a 30-pack-year history of smoking and are a current smoker or have quit within the past 15 years. A pack year of smoking is smoking an average of 1 pack of cigarettes a day for 1 year (for example: 1 pack a day for 30 years or 2 packs a day for 15 years). Yearly screening should continue until the smoker has stopped smoking for at least 15 years. Yearly screening should be stopped for people who develop a health problem that would prevent them from having lung cancer treatment.  If you are pregnant, do not drink alcohol. If you are   are breastfeeding, be very cautious about drinking alcohol. If you are not pregnant and choose to drink alcohol, do not have more than 1 drink per day. One drink is considered to be 12 ounces (355 mL) of beer, 5 ounces (148 mL) of wine, or 1.5 ounces (44 mL) of liquor.  Avoid use of street drugs. Do not share needles with anyone. Ask for help if you need support or instructions about stopping the use of drugs.  High blood pressure causes heart disease and  increases the risk of stroke. Your blood pressure should be checked at least every 1 to 2 years. Ongoing high blood pressure should be treated with medicines if weight loss and exercise do not work.  If you are 12-58 years old, ask your health care provider if you should take aspirin to prevent strokes.  Diabetes screening is done by taking a blood sample to check your blood glucose level after you have not eaten for a certain period of time (fasting). If you are not overweight and you do not have risk factors for diabetes, you should be screened once every 3 years starting at age 76. If you are overweight or obese and you are 18-1 years of age, you should be screened for diabetes every year as part of your cardiovascular risk assessment.  Breast cancer screening is essential preventive care for women. You should practice "breast self-awareness." This means understanding the normal appearance and feel of your breasts and may include breast self-examination. Any changes detected, no matter how small, should be reported to a health care provider. Women in their 73s and 30s should have a clinical breast exam (CBE) by a health care provider as part of a regular health exam every 1 to 3 years. After age 36, women should have a CBE every year. Starting at age 50, women should consider having a mammogram (breast X-ray test) every year. Women who have a family history of breast cancer should talk to their health care provider about genetic screening. Women at a high risk of breast cancer should talk to their health care providers about having an MRI and a mammogram every year.  Breast cancer gene (BRCA)-related cancer risk assessment is recommended for women who have family members with BRCA-related cancers. BRCA-related cancers include breast, ovarian, tubal, and peritoneal cancers. Having family members with these cancers may be associated with an increased risk for harmful changes (mutations) in the breast  cancer genes BRCA1 and BRCA2. Results of the assessment will determine the need for genetic counseling and BRCA1 and BRCA2 testing.  Your health care provider may recommend that you be screened regularly for cancer of the pelvic organs (ovaries, uterus, and vagina). This screening involves a pelvic examination, including checking for microscopic changes to the surface of your cervix (Pap test). You may be encouraged to have this screening done every 3 years, beginning at age 40.  For women ages 32-65, health care providers may recommend pelvic exams and Pap testing every 3 years, or they may recommend the Pap and pelvic exam, combined with testing for human papilloma virus (HPV), every 5 years. Some types of HPV increase your risk of cervical cancer. Testing for HPV may also be done on women of any age with unclear Pap test results.  Other health care providers may not recommend any screening for nonpregnant women who are considered low risk for pelvic cancer and who do not have symptoms. Ask your health care provider if a screening pelvic exam is right  you.  If you have had past treatment for cervical cancer or a condition that could lead to cancer, you need Pap tests and screening for cancer for at least 20 years after your treatment. If Pap tests have been discontinued, your risk factors (such as having a new sexual partner) need to be reassessed to determine if screening should resume. Some women have medical problems that increase the chance of getting cervical cancer. In these cases, your health care provider may recommend more frequent screening and Pap tests.  Colorectal cancer can be detected and often prevented. Most routine colorectal cancer screening begins at the age of 50 years and continues through age 75 years. However, your health care provider may recommend screening at an earlier age if you have risk factors for colon cancer. On a yearly basis, your health care provider may provide home test kits to check  for hidden blood in the stool. Use of a small camera at the end of a tube, to directly examine the colon (sigmoidoscopy or colonoscopy), can detect the earliest forms of colorectal cancer. Talk to your health care provider about this at age 50, when routine screening begins. Direct exam of the colon should be repeated every 5-10 years through age 75 years, unless early forms of precancerous polyps or small growths are found.  People who are at an increased risk for hepatitis B should be screened for this virus. You are considered at high risk for hepatitis B if:  You were born in a country where hepatitis B occurs often. Talk with your health care provider about which countries are considered high risk.  Your parents were born in a high-risk country and you have not received a shot to protect against hepatitis B (hepatitis B vaccine).  You have HIV or AIDS.  You use needles to inject street drugs.  You live with, or have sex with, someone who has hepatitis B.  You get hemodialysis treatment.  You take certain medicines for conditions like cancer, organ transplantation, and autoimmune conditions.  Hepatitis C blood testing is recommended for all people born from 1945 through 1965 and any individual with known risks for hepatitis C.  Practice safe sex. Use condoms and avoid high-risk sexual practices to reduce the spread of sexually transmitted infections (STIs). STIs include gonorrhea, chlamydia, syphilis, trichomonas, herpes, HPV, and human immunodeficiency virus (HIV). Herpes, HIV, and HPV are viral illnesses that have no cure. They can result in disability, cancer, and death.  You should be screened for sexually transmitted illnesses (STIs) including gonorrhea and chlamydia if:  You are sexually active and are younger than 24 years.  You are older than 24 years and your health care provider tells you that you are at risk for this type of infection.  Your sexual activity has changed  since you were last screened and you are at an increased risk for chlamydia or gonorrhea. Ask your health care provider if you are at risk.  If you are at risk of being infected with HIV, it is recommended that you take a prescription medicine daily to prevent HIV infection. This is called preexposure prophylaxis (PrEP). You are considered at risk if:  You are sexually active and do not regularly use condoms or know the HIV status of your partner(s).  You take drugs by injection.  You are sexually active with a partner who has HIV.  Talk with your health care provider about whether you are at high risk of being infected with HIV. If   you choose to begin PrEP, you should first be tested for HIV. You should then be tested every 3 months for as long as you are taking PrEP.  Osteoporosis is a disease in which the bones lose minerals and strength with aging. This can result in serious bone fractures or breaks. The risk of osteoporosis can be identified using a bone density scan. Women ages 65 years and over and women at risk for fractures or osteoporosis should discuss screening with their health care providers. Ask your health care provider whether you should take a calcium supplement or vitamin D to reduce the rate of osteoporosis.  Menopause can be associated with physical symptoms and risks. Hormone replacement therapy is available to decrease symptoms and risks. You should talk to your health care provider about whether hormone replacement therapy is right for you.  Use sunscreen. Apply sunscreen liberally and repeatedly throughout the day. You should seek shade when your shadow is shorter than you. Protect yourself by wearing long sleeves, pants, a wide-brimmed hat, and sunglasses year round, whenever you are outdoors.  Once a month, do a whole body skin exam, using a mirror to look at the skin on your back. Tell your health care provider of new moles, moles that have irregular borders, moles that  are larger than a pencil eraser, or moles that have changed in shape or color.  Stay current with required vaccines (immunizations).  Influenza vaccine. All adults should be immunized every year.  Tetanus, diphtheria, and acellular pertussis (Td, Tdap) vaccine. Pregnant women should receive 1 dose of Tdap vaccine during each pregnancy. The dose should be obtained regardless of the length of time since the last dose. Immunization is preferred during the 27th-36th week of gestation. An adult who has not previously received Tdap or who does not know her vaccine status should receive 1 dose of Tdap. This initial dose should be followed by tetanus and diphtheria toxoids (Td) booster doses every 10 years. Adults with an unknown or incomplete history of completing a 3-dose immunization series with Td-containing vaccines should begin or complete a primary immunization series including a Tdap dose. Adults should receive a Td booster every 10 years.  Varicella vaccine. An adult without evidence of immunity to varicella should receive 2 doses or a second dose if she has previously received 1 dose. Pregnant females who do not have evidence of immunity should receive the first dose after pregnancy. This first dose should be obtained before leaving the health care facility. The second dose should be obtained 4-8 weeks after the first dose.  Human papillomavirus (HPV) vaccine. Females aged 13-26 years who have not received the vaccine previously should obtain the 3-dose series. The vaccine is not recommended for use in pregnant females. However, pregnancy testing is not needed before receiving a dose. If a female is found to be pregnant after receiving a dose, no treatment is needed. In that case, the remaining doses should be delayed until after the pregnancy. Immunization is recommended for any person with an immunocompromised condition through the age of 26 years if she did not get any or all doses earlier. During the  3-dose series, the second dose should be obtained 4-8 weeks after the first dose. The third dose should be obtained 24 weeks after the first dose and 16 weeks after the second dose.  Zoster vaccine. One dose is recommended for adults aged 60 years or older unless certain conditions are present.  Measles, mumps, and rubella (MMR) vaccine. Adults born   born before [birth year] generally are considered immune to measles and mumps. Adults born in [birth year] or later should have 1 or more doses of MMR vaccine unless there is a contraindication to the vaccine or there is laboratory evidence of immunity to each of the three diseases. A routine second dose of MMR vaccine should be obtained at least 28 days after the first dose for students attending postsecondary schools, health care workers, or international travelers. People who received inactivated measles vaccine or an unknown type of measles vaccine during 1963-1967 should receive 2 doses of MMR vaccine. People who received inactivated mumps vaccine or an unknown type of mumps vaccine before 1979 and are at high risk for mumps infection should consider immunization with 2 doses of MMR vaccine. For females of childbearing age, rubella immunity should be determined. If there is no evidence of immunity, females who are not pregnant should be vaccinated. If there is no evidence of immunity, females who are pregnant should delay immunization until after pregnancy. Unvaccinated health care workers born before [birth year] who lack laboratory evidence of measles, mumps, or rubella immunity or laboratory confirmation of disease should consider measles and mumps immunization with 2 doses of MMR vaccine or rubella immunization with 1 dose of MMR vaccine.  Pneumococcal 13-valent conjugate (PCV13) vaccine. When indicated, a person who is uncertain of his immunization history and has no record of immunization should receive the PCV13 vaccine. All adults 62 years of age and older  should receive this vaccine. An adult aged 39 years or older who has certain medical conditions and has not been previously immunized should receive 1 dose of PCV13 vaccine. This PCV13 should be followed with a dose of pneumococcal polysaccharide (PPSV23) vaccine. Adults who are at high risk for pneumococcal disease should obtain the PPSV23 vaccine at least 8 weeks after the dose of PCV13 vaccine. Adults older than 69 years of age who have normal immune system function should obtain the PPSV23 vaccine dose at least 1 year after the dose of PCV13 vaccine.  Pneumococcal polysaccharide (PPSV23) vaccine. When PCV13 is also indicated, PCV13 should be obtained first. All adults aged 47 years and older should be immunized. An adult younger than age 27 years who has certain medical conditions should be immunized. Any person who resides in a nursing home or long-term care facility should be immunized. An adult smoker should be immunized. People with an immunocompromised condition and certain other conditions should receive both PCV13 and PPSV23 vaccines. People with human immunodeficiency virus (HIV) infection should be immunized as soon as possible after diagnosis. Immunization during chemotherapy or radiation therapy should be avoided. Routine use of PPSV23 vaccine is not recommended for American Indians, [place] Natives, or people younger than 65 years unless there are medical conditions that require PPSV23 vaccine. When indicated, people who have unknown immunization and have no record of immunization should receive PPSV23 vaccine. One-time revaccination 5 years after the first dose of PPSV23 is recommended for people aged 19-64 years who have chronic kidney failure, nephrotic syndrome, asplenia, or immunocompromised conditions. People who received 1-2 doses of PPSV23 before age 57 years should receive another dose of PPSV23 vaccine at age 42 years or later if at least 5 years have passed since the previous dose. Doses  of PPSV23 are not needed for people immunized with PPSV23 at or after age 51 years.  Meningococcal vaccine. Adults with asplenia or persistent complement component deficiencies should receive 2 doses of quadrivalent meningococcal conjugate (MenACWY-D) vaccine. The doses should be  at least 2 months apart. Microbiologists working with certain meningococcal bacteria, military recruits, people at risk during an outbreak, and people who travel to or live in countries with a high rate of meningitis should be immunized. A first-year college student up through age 21 years who is living in a residence hall should receive a dose if she did not receive a dose on or after her 16th birthday. Adults who have certain high-risk conditions should receive one or more doses of vaccine.  Hepatitis A vaccine. Adults who wish to be protected from this disease, have certain high-risk conditions, work with hepatitis A-infected animals, work in hepatitis A research labs, or travel to or work in countries with a high rate of hepatitis A should be immunized. Adults who were previously unvaccinated and who anticipate close contact with an international adoptee during the first 60 days after arrival in the [place] from a country with a high rate of hepatitis A should be immunized.  Hepatitis B vaccine. Adults who wish to be protected from this disease, have certain high-risk conditions, may be exposed to blood or other infectious body fluids, are household contacts or sex partners of hepatitis B positive people, are clients or workers in certain care facilities, or travel to or work in countries with a high rate of hepatitis B should be immunized.  Haemophilus influenzae type b (Hib) vaccine. A previously unvaccinated person with asplenia or sickle cell disease or having a scheduled splenectomy should receive 1 dose of Hib vaccine. Regardless of previous immunization, a recipient of a hematopoietic stem cell transplant should receive a  3-dose series 6-12 months after her successful transplant. Hib vaccine is not recommended for adults with HIV infection. Preventive Services / Frequency Ages 19 to 39 years  Blood pressure check.** / Every 3-5 years.  Lipid and cholesterol check.** / Every 5 years beginning at age 20.  Clinical breast exam.** / Every 3 years for women in their 20s and 30s.  BRCA-related cancer risk assessment.** / For women who have family members with a BRCA-related cancer (breast, ovarian, tubal, or peritoneal cancers).  Pap test.** / Every 2 years from ages 21 through 29. Every 3 years starting at age 30 through age 65 or 70 with a history of 3 consecutive normal Pap tests.  HPV screening.** / Every 3 years from ages 30 through ages 65 to 70 with a history of 3 consecutive normal Pap tests.  Hepatitis C blood test.** / For any individual with known risks for hepatitis C.  Skin self-exam. / Monthly.  Influenza vaccine. / Every year.  Tetanus, diphtheria, and acellular pertussis (Tdap, Td) vaccine.** / Consult your health care provider. Pregnant women should receive 1 dose of Tdap vaccine during each pregnancy. 1 dose of Td every 10 years.  Varicella vaccine.** / Consult your health care provider. Pregnant females who do not have evidence of immunity should receive the first dose after pregnancy.  HPV vaccine. / 3 doses over 6 months, if 26 and younger. The vaccine is not recommended for use in pregnant females. However, pregnancy testing is not needed before receiving a dose.  Measles, mumps, rubella (MMR) vaccine.** / You need at least 1 dose of MMR if you were born in 1957 or later. You may also need a 2nd dose. For females of childbearing age, rubella immunity should be determined. If there is no evidence of immunity, females who are not pregnant should be vaccinated. If there is no evidence of immunity, females who are   pregnant should delay immunization until after pregnancy.  Pneumococcal  13-valent conjugate (PCV13) vaccine.** / Consult your health care provider.  Pneumococcal polysaccharide (PPSV23) vaccine.** / 1 to 2 doses if you smoke cigarettes or if you have certain conditions.  Meningococcal vaccine.** / 1 dose if you are age 19 to 21 years and a first-year college student living in a residence hall, or have one of several medical conditions, you need to get vaccinated against meningococcal disease. You may also need additional booster doses.  Hepatitis A vaccine.** / Consult your health care provider.  Hepatitis B vaccine.** / Consult your health care provider.  Haemophilus influenzae type b (Hib) vaccine.** / Consult your health care provider. Ages 40 to 64 years  Blood pressure check.** / Every year.  Lipid and cholesterol check.** / Every 5 years beginning at age 20 years.  Lung cancer screening. / Every year if you are aged 55-80 years and have a 30-pack-year history of smoking and currently smoke or have quit within the past 15 years. Yearly screening is stopped once you have quit smoking for at least 15 years or develop a health problem that would prevent you from having lung cancer treatment.  Clinical breast exam.** / Every year after age 40 years.  BRCA-related cancer risk assessment.** / For women who have family members with a BRCA-related cancer (breast, ovarian, tubal, or peritoneal cancers).  Mammogram.** / Every year beginning at age 40 years and continuing for as long as you are in good health. Consult with your health care provider.  Pap test.** / Every 3 years starting at age 30 years through age 65 or 70 years with a history of 3 consecutive normal Pap tests.  HPV screening.** / Every 3 years from ages 30 years through ages 65 to 70 years with a history of 3 consecutive normal Pap tests.  Fecal occult blood test (FOBT) of stool. / Every year beginning at age 50 years and continuing until age 75 years. You may not need to do this test if you get  a colonoscopy every 10 years.  Flexible sigmoidoscopy or colonoscopy.** / Every 5 years for a flexible sigmoidoscopy or every 10 years for a colonoscopy beginning at age 50 years and continuing until age 75 years.  Hepatitis C blood test.** / For all people born from 1945 through 1965 and any individual with known risks for hepatitis C.  Skin self-exam. / Monthly.  Influenza vaccine. / Every year.  Tetanus, diphtheria, and acellular pertussis (Tdap/Td) vaccine.** / Consult your health care provider. Pregnant women should receive 1 dose of Tdap vaccine during each pregnancy. 1 dose of Td every 10 years.  Varicella vaccine.** / Consult your health care provider. Pregnant females who do not have evidence of immunity should receive the first dose after pregnancy.  Zoster vaccine.** / 1 dose for adults aged 60 years or older.  Measles, mumps, rubella (MMR) vaccine.** / You need at least 1 dose of MMR if you were born in 1957 or later. You may also need a second dose. For females of childbearing age, rubella immunity should be determined. If there is no evidence of immunity, females who are not pregnant should be vaccinated. If there is no evidence of immunity, females who are pregnant should delay immunization until after pregnancy.  Pneumococcal 13-valent conjugate (PCV13) vaccine.** / Consult your health care provider.  Pneumococcal polysaccharide (PPSV23) vaccine.** / 1 to 2 doses if you smoke cigarettes or if you have certain conditions.  Meningococcal vaccine.** /   Consult your health care provider.  Hepatitis A vaccine.** / Consult your health care provider.  Hepatitis B vaccine.** / Consult your health care provider.  Haemophilus influenzae type b (Hib) vaccine.** / Consult your health care provider. Ages 80 years and over  Blood pressure check.** / Every year.  Lipid and cholesterol check.** / Every 5 years beginning at age 62 years.  Lung cancer  screening. / Every year if you are aged 32-80 years and have a 30-pack-year history of smoking and currently smoke or have quit within the past 15 years. Yearly screening is stopped once you have quit smoking for at least 15 years or develop a health problem that would prevent you from having lung cancer treatment.  Clinical breast exam.** / Every year after age 61 years.  BRCA-related cancer risk assessment.** / For women who have family members with a BRCA-related cancer (breast, ovarian, tubal, or peritoneal cancers).  Mammogram.** / Every year beginning at age 39 years and continuing for as long as you are in good health. Consult with your health care provider.  Pap test.** / Every 3 years starting at age 85 years through age 74 or 72 years with 3 consecutive normal Pap tests. Testing can be stopped between 65 and 70 years with 3 consecutive normal Pap tests and no abnormal Pap or HPV tests in the past 10 years.  HPV screening.** / Every 3 years from ages 55 years through ages 67 or 77 years with a history of 3 consecutive normal Pap tests. Testing can be stopped between 65 and 70 years with 3 consecutive normal Pap tests and no abnormal Pap or HPV tests in the past 10 years.  Fecal occult blood test (FOBT) of stool. / Every year beginning at age 81 years and continuing until age 22 years. You may not need to do this test if you get a colonoscopy every 10 years.  Flexible sigmoidoscopy or colonoscopy.** / Every 5 years for a flexible sigmoidoscopy or every 10 years for a colonoscopy beginning at age 67 years and continuing until age 22 years.  Hepatitis C blood test.** / For all people born from [birth year] through 1964 and any individual with known risks for hepatitis C.  Osteoporosis screening.** / A one-time screening for women ages 8 years and over and women at risk for fractures or osteoporosis.  Skin self-exam. / Monthly.  Influenza vaccine. / Every year.  Tetanus, diphtheria, and  acellular pertussis (Tdap/Td) vaccine.** / 1 dose of Td every 10 years.  Varicella vaccine.** / Consult your health care provider.  Zoster vaccine.** / 1 dose for adults aged 56 years or older.  Pneumococcal 13-valent conjugate (PCV13) vaccine.** / Consult your health care provider.  Pneumococcal polysaccharide (PPSV23) vaccine.** / 1 dose for all adults aged 15 years and older.  Meningococcal vaccine.** / Consult your health care provider.  Hepatitis A vaccine.** / Consult your health care provider.  Hepatitis B vaccine.** / Consult your health care provider.  Haemophilus influenzae type b (Hib) vaccine.** / Consult your health care provider. ** Family history and personal history of risk and conditions may change your health care provider's recommendations.   This information is not intended to replace advice given to you by your health care provider. Make sure you discuss any questions you have with your health care provider.   Document Released: 10/13/2001 Document Revised: 09/07/2014 Document Reviewed: 01/12/2011 Elsevier Interactive Patient Education [employer].

## 2015-10-06 ENCOUNTER — Encounter: Payer: Self-pay | Admitting: Family Medicine

## 2015-10-06 DIAGNOSIS — Z0000 Encounter for general adult medical examination without abnormal findings: Secondary | ICD-10-CM | POA: Insufficient documentation

## 2015-10-06 HISTORY — DX: Encounter for general adult medical examination without abnormal findings: Z00.00

## 2015-10-06 NOTE — Assessment & Plan Note (Signed)
hgba1c acceptable, minimize simple carbs. Increase exercise as tolerated.  

## 2015-10-06 NOTE — Assessment & Plan Note (Signed)
Well controlled, no changes to meds. Encouraged heart healthy diet such as the DASH diet and exercise as tolerated.  °

## 2015-10-06 NOTE — Assessment & Plan Note (Signed)
On Levothyroxine, continue to monitor 

## 2015-10-06 NOTE — Assessment & Plan Note (Signed)
Tolerating statin, encouraged heart healthy diet, avoid trans fats, minimize simple carbs and saturated fats. Increase exercise as tolerated 

## 2015-10-06 NOTE — Assessment & Plan Note (Addendum)
Patient denies any difficulties at home. No trouble with ADLs, depression or falls. See EMR for functional status screen and depression screen. No recent changes to vision or hearing. Is UTD with immunizations. Is UTD with screening. Discussed Advanced Directives. Encouraged heart healthy diet, exercise as tolerated and adequate sleep. See patient's problem list for health risk factors to monitor. See AVS for preventative healthcare recommendation schedule. Labs ordered and reviewed. Hep C antibody is negative Pneumovax given today Colonoscopy in 2010 MGM, Dexa scan in 2015

## 2015-10-06 NOTE — Assessment & Plan Note (Signed)
Vitamin D level WNL, continue calcium and vitamin D supplements, stay active.

## 2015-11-14 ENCOUNTER — Ambulatory Visit (INDEPENDENT_AMBULATORY_CARE_PROVIDER_SITE_OTHER): Payer: Commercial Managed Care - HMO | Admitting: Family Medicine

## 2015-11-14 VITALS — BP 128/74 | HR 74 | Temp 98.9°F | Ht 62.0 in | Wt 154.0 lb

## 2015-11-14 DIAGNOSIS — R05 Cough: Secondary | ICD-10-CM

## 2015-11-14 DIAGNOSIS — R0981 Nasal congestion: Secondary | ICD-10-CM

## 2015-11-14 DIAGNOSIS — R6883 Chills (without fever): Secondary | ICD-10-CM

## 2015-11-14 DIAGNOSIS — R059 Cough, unspecified: Secondary | ICD-10-CM

## 2015-11-14 MED ORDER — AMOXICILLIN 875 MG PO TABS: 875.0000 mg | ORAL_TABLET | Freq: Two times a day (BID) | ORAL | Status: DC

## 2015-11-14 NOTE — Progress Notes (Signed)
[place] [facility] [address] [place], [place] [address] [phone]  Date:  11/14/2015   Name:  RYATT STYLE   DOB:  November 15, 1945   MRN:  [id number]  PCP:  Penni Homans, MD    Chief Complaint: Cough   History of Present Illness:  Michelle Jacobs is a 70 y.o. very pleasant female patient who presents with the following:  History of HTN, osteoporosis, hypothyroidism. Here today with complaint of cough- she notes a productive cough and green mucus from her chest and sinuses.  Last night she felt feverish and achy, chilled.   She did take a couple of tylenol and felt better.  She has been ill for one day.  She does have allergies also- she is on flonase and zyrtec  No GI symptoms.    Discussed likely viral character of her current illness.  She is worried as she has had prolonged sinus infections in the past (not in several years however).  She is willing to take an abx rx to hold in case she does not improve  Patient Active Problem List   Diagnosis Date Noted  . Annual physical exam 10/06/2015  . Skin lesion of back 01/28/2014  . Hyperglycemia 05/20/2013  . Hypothyroidism 05/30/2012  . Allergic rhinitis 01/06/2012  . Medicare annual wellness visit, subsequent 12/02/2011  . HTN (hypertension) 09/06/2011  . Hyperlipidemia 09/06/2011  . Colon polyps 09/06/2011  . Osteoporosis 09/06/2011  . Breast cancer screening 09/06/2011    Past Medical History  Diagnosis Date  . History of chicken pox   . GERD (gastroesophageal reflux disease)     mild  . Allergy   . Hypertension   . Hyperlipidemia   . Personal history of colonic polyps 2009    Benign colon polyps  . Preventative health care 12/02/2011  . Hyperglycemia 05/20/2013  . Medicare annual wellness visit, subsequent 12/02/2011  . Annual physical exam 10/06/2015    No past surgical history on file.  Social History  Substance Use Topics  . Smoking status:  Never Smoker   . Smokeless tobacco: Never Used  . Alcohol Use: 6.0 oz/week    10 Glasses of wine per week    Family History  Problem Relation Age of Onset  . Cancer Mother     lung  . Hypertension Mother   . Heart disease Father     CHF  . Diabetes Maternal Uncle     type II  . Heart disease Maternal Uncle     s/p CABG  . Cancer Maternal Grandmother 10    breast  . Heart disease Maternal Grandmother     MI, CHF  . Obesity Maternal Grandmother   . Cancer Sister     uterine  . Hypertension Sister   . Hyperlipidemia Brother   . Hypertension Brother   . Kidney disease Maternal Grandfather     kidney failure  . Cancer Paternal Grandmother   . COPD Paternal Grandfather   . Diabetes Maternal Aunt     older, type 2    Allergies  Allergen Reactions  . Iodine Rash  . Mercurochrome [Merbromin] Rash  . Sulfa Antibiotics Rash    Medication list has been reviewed and updated.  Current Outpatient Prescriptions on File Prior to Visit  Medication Sig Dispense Refill  . alendronate (FOSAMAX) 70 MG tablet TAKE 1 TABLET  EVERY  7  DAYS.  TAKE  WITH  A  FULL  GLASS OF WATER ON AN EMPTY STOMACH. 12 tablet 3  . aspirin EC 81 MG tablet Take 81 mg by mouth daily.      Marland Kitchen atorvastatin (LIPITOR) 10 MG tablet Take 1 tablet (10 mg total) by mouth daily. 90 tablet 3  . Calcium Carbonate-Vitamin D (CALCIUM-D) 600-400 MG-UNIT TABS Take 1 tablet by mouth daily.     . cetirizine (ZYRTEC) 10 MG tablet Take 10 mg by mouth daily.    . fluticasone (FLONASE) 50 MCG/ACT nasal spray Place 2 sprays into both nostrils daily as needed for rhinitis or allergies. 48 g 0  . levothyroxine (SYNTHROID, LEVOTHROID) 125 MCG tablet Take 1 tablet (125 mcg total) by mouth daily. 90 tablet 1  . lisinopril (PRINIVIL,ZESTRIL) 10 MG tablet Take 1 tablet (10 mg total) by mouth daily. 90 tablet 3  . Omega-3 Fatty Acids (FISH OIL) 1200 MG CAPS Take 4 capsules by mouth daily.       No current facility-administered  medications on file prior to visit.    Review of Systems:  As per HPI- otherwise negative.   Physical Examination: Filed Vitals:   11/14/15 1255  BP: 128/74  Pulse: 74  Temp: 98.9 F (37.2 C)   Filed Vitals:   11/14/15 1255  Height: 5\' 2"  (1.575 m)  Weight: 154 lb (69.854 kg)   Body mass index is 28.16 kg/(m^2). Ideal Body Weight: Weight in (lb) to have BMI = 25: 136.4  GEN: WDWN, NAD, Non-toxic, A & O x 3 HEENT: Atraumatic, Normocephalic. Neck supple. No masses, No LAD. Ears and Nose: No external deformity. CV: RRR, No M/G/R. No JVD. No thrill. No extra heart sounds. PULM: CTA B, no wheezes, crackles, rhonchi. No retractions. No resp. distress. No accessory muscle use. ABD: S, NT, ND, +BS. No rebound. No HSM. EXTR: No c/c/e NEURO Normal gait.  PSYCH: Normally interactive. Conversant. Not depressed or anxious appearing.  Calm demeanor.    Assessment and Plan: Sinus congestion - Plan: amoxicillin (AMOXIL) 875 MG tablet  Cough  Chills  Here today with likely viral URI.  Pt is concerned that she needs an abx.  We compromised on an rx to hold and use if she is not better soon In the meantime she will use supportive measures    Signed Lamar Blinks, MD

## 2015-11-14 NOTE — Progress Notes (Signed)
Pre visit review using our clinic review tool, if applicable. No additional management support is needed unless otherwise documented below in the visit note. 

## 2015-11-14 NOTE — Patient Instructions (Signed)
I am sorry that you are not feeling well- you seem to have a viral URI (a cold) Use tylenol, mucinex or the other OTC medications of your choice as needed for symptoms Rest and drink plenty of fluids If you are not feeling better in the next several days fill and use the amoxicillin rx- however at this point I do not think an antibiotic will do anything to help you and may cause diarrhea  Let me know if you have any concerns!

## 2015-12-04 ENCOUNTER — Encounter: Payer: Self-pay | Admitting: Medical

## 2015-12-04 ENCOUNTER — Ambulatory Visit (INDEPENDENT_AMBULATORY_CARE_PROVIDER_SITE_OTHER): Payer: Commercial Managed Care - HMO | Admitting: Medical

## 2015-12-04 VITALS — BP 118/78 | HR 87 | Temp 98.3°F | Ht 62.0 in | Wt 153.2 lb

## 2015-12-04 DIAGNOSIS — J209 Acute bronchitis, unspecified: Secondary | ICD-10-CM | POA: Diagnosis not present

## 2015-12-04 DIAGNOSIS — J111 Influenza due to unidentified influenza virus with other respiratory manifestations: Secondary | ICD-10-CM

## 2015-12-04 DIAGNOSIS — R059 Cough, unspecified: Secondary | ICD-10-CM

## 2015-12-04 DIAGNOSIS — J029 Acute pharyngitis, unspecified: Secondary | ICD-10-CM | POA: Diagnosis not present

## 2015-12-04 DIAGNOSIS — R05 Cough: Secondary | ICD-10-CM | POA: Diagnosis not present

## 2015-12-04 LAB — POCT INFLUENZA A/B
Influenza A, POC: NEGATIVE
Influenza B, POC: NEGATIVE

## 2015-12-04 LAB — POCT RAPID STREP A (OFFICE): Rapid Strep A Screen: NEGATIVE

## 2015-12-04 MED ORDER — BENZONATATE 100 MG PO CAPS: 100.0000 mg | ORAL_CAPSULE | Freq: Three times a day (TID) | ORAL | Status: DC | PRN

## 2015-12-04 MED ORDER — AZITHROMYCIN 250 MG PO TABS: ORAL_TABLET | ORAL | Status: DC

## 2015-12-04 MED ORDER — FLUTICASONE PROPIONATE 50 MCG/ACT NA SUSP: 2.0000 | Freq: Every day | NASAL | Status: DC

## 2015-12-04 MED ORDER — OSELTAMIVIR PHOSPHATE 75 MG PO CAPS: 75.0000 mg | ORAL_CAPSULE | Freq: Two times a day (BID) | ORAL | Status: DC

## 2015-12-04 NOTE — Progress Notes (Signed)
Pre visit review using our clinic review tool, if applicable. No additional management support is needed unless otherwise documented below in the visit note. 

## 2015-12-04 NOTE — Patient Instructions (Addendum)
Your strep test and flu test were both negative.  Will treat you for bronchitis with azithromycin antibiotic. This also will cover strep in event test was falsely negative.  For cough rx benzonatate.  For nasal congestion flonase.  Your flu test was negative. But in the event you still feel achy, fatigued and fever by tomorrow would at that point start tamiflu. Since treatment for flu is considered effective within 48 hours.  Follow up in 7 days or as needed

## 2015-12-04 NOTE — Progress Notes (Signed)
Subjective:    Patient ID: Michelle Jacobs, female    DOB: [birth year], 70 y.o.   MRN: [id number]  HPI   Pt in with st and cough. Pain in throat for one day. Pain on swallowing her own saliva. Some cough that is productive. Pt feels congested in her chest. Some nasal congested as well. But no sinus pressure.  Pt felt achy and fever last night. Pt had flu vaccine this year. Some achiness that persists today.   Review of Systems  Constitutional: Positive for fever, chills and fatigue.  HENT: Positive for congestion and sore throat. Negative for ear pain, postnasal drip, sinus pressure and trouble swallowing.   Respiratory: Positive for cough. Negative for shortness of breath and wheezing.   Cardiovascular: Negative for chest pain and palpitations.  Gastrointestinal: Negative for abdominal pain.  Endocrine: Negative for polydipsia, polyphagia and polyuria.  Musculoskeletal: Positive for myalgias.  Hematological: Negative for adenopathy. Does not bruise/bleed easily.  Psychiatric/Behavioral: Negative for behavioral problems and confusion.     Past Medical History  Diagnosis Date  . History of chicken pox   . GERD (gastroesophageal reflux disease)     mild  . Allergy   . Hypertension   . Hyperlipidemia   . Personal history of colonic polyps 2009    Benign colon polyps  . Preventative health care 12/02/2011  . Hyperglycemia 05/20/2013  . Medicare annual wellness visit, subsequent 12/02/2011  . Annual physical exam 10/06/2015    Social History   Social History  . Marital Status: Married    Spouse Name: N/A  . Number of Children: 2  . Years of Education: N/A   Occupational History  .     Social History Main Topics  . Smoking status: Never Smoker   . Smokeless tobacco: Never Used  . Alcohol Use: 6.0 oz/week    10 Glasses of wine per week  . Drug Use: No  . Sexual Activity: Not on file     Comment: lives with husband, no dietary restrictions   Other Topics Concern  .  Not on file   Social History Narrative   Caffeine use:  2-3 cups coffee daily   Regular exercise:  Walks daily          No past surgical history on file.  Family History  Problem Relation Age of Onset  . Cancer Mother     lung  . Hypertension Mother   . Heart disease Father     CHF  . Diabetes Maternal Uncle     type II  . Heart disease Maternal Uncle     s/p CABG  . Cancer Maternal Grandmother 55    breast  . Heart disease Maternal Grandmother     MI, CHF  . Obesity Maternal Grandmother   . Cancer Sister     uterine  . Hypertension Sister   . Hyperlipidemia Brother   . Hypertension Brother   . Kidney disease Maternal Grandfather     kidney failure  . Cancer Paternal Grandmother   . COPD Paternal Grandfather   . Diabetes Maternal Aunt     older, type 2    Allergies  Allergen Reactions  . Iodine Rash  . Mercurochrome [Merbromin] Rash  . Sulfa Antibiotics Rash    Current Outpatient Prescriptions on File Prior to Visit  Medication Sig Dispense Refill  . alendronate (FOSAMAX) 70 MG tablet TAKE 1 TABLET  EVERY  7  DAYS.  TAKE  WITH  A  FULL  GLASS OF WATER ON AN EMPTY STOMACH. 12 tablet 3  . aspirin EC 81 MG tablet Take 81 mg by mouth daily.      Marland Kitchen atorvastatin (LIPITOR) 10 MG tablet Take 1 tablet (10 mg total) by mouth daily. 90 tablet 3  . Calcium Carbonate-Vitamin D (CALCIUM-D) 600-400 MG-UNIT TABS Take 1 tablet by mouth daily.     . cetirizine (ZYRTEC) 10 MG tablet Take 10 mg by mouth daily.    Marland Kitchen levothyroxine (SYNTHROID, LEVOTHROID) 125 MCG tablet Take 1 tablet (125 mcg total) by mouth daily. 90 tablet 1  . lisinopril (PRINIVIL,ZESTRIL) 10 MG tablet Take 1 tablet (10 mg total) by mouth daily. 90 tablet 3  . Omega-3 Fatty Acids (FISH OIL) 1200 MG CAPS Take 4 capsules by mouth daily.       No current facility-administered medications on file prior to visit.    BP 118/78 mmHg  Pulse 87  Temp(Src) 98.3 F (36.8 C) (Oral)  Ht 5\' 2"  (1.575 m)  Wt 153 lb 3.2  oz (69.491 kg)  BMI 28.01 kg/m2  SpO2 98%       Objective:   Physical Exam  General  Mental Status - Alert. General Appearance - Well groomed. Not in acute distress.  Skin Rashes- No Rashes.  HEENT Head- Normal. Ear Auditory Canal - Left- Normal. Right - Normal.Tympanic Membrane- Left- Normal. Right- Normal. Eye Sclera/Conjunctiva- Left- Normal. Right- Normal. Nose & Sinuses Nasal Mucosa- Left-  Boggy and Congested. Right-  Boggy and  Congested.Bilateral no  maxillary and  No frontal sinus pressure. Mouth & Throat Lips: Upper Lip- Normal: no dryness, cracking, pallor, cyanosis, or vesicular eruption. Lower Lip-Normal: no dryness, cracking, pallor, cyanosis or vesicular eruption. Buccal Mucosa- Bilateral- No Aphthous ulcers. Oropharynx- No Discharge or Erythema. Tonsils: Characteristics- Bilateral- mild Erythema but no Congestion. Size/Enlargement- Bilateral- No enlargement. Discharge- bilateral-None.  Neck Neck- Supple. No Masses.    Chest and Lung Exam Auscultation: Breath Sounds:-Clear even and unlabored.  Cardiovascular Auscultation:Rythm- Regular, rate and rhythm. Murmurs & Other Heart Sounds:Ausculatation of the heart reveal- No Murmurs.  Lymphatic Head & Neck General Head & Neck Lymphatics: Bilateral: Description- No Localized lymphadenopathy.       Assessment & Plan:  Your strep test and flu test were both negative.  Will treat you for bronchitis with azithromycin antibiotic. This also will cover strep in event test was falsely negative.  For cough rx benzonatate.  For nasal congestion flonase.  Your flu test was negative. But in the event you still feel achy, fatigued and fever by tomorrow would at that point start tamiflu. Since treatment for flu is considered effective within 48 hours.  Follow up in 7 days or as needed

## 2015-12-04 NOTE — Addendum Note (Signed)
Addended by: Tasia Catchings on: 12/04/2015 05:23 PM   Modules accepted: Orders

## 2015-12-04 NOTE — Addendum Note (Signed)
Addended by: Anabel Halon on: 12/04/2015 12:41 PM   Modules accepted: Level of Service, SmartSet

## 2016-03-23 ENCOUNTER — Encounter: Payer: Self-pay | Admitting: Family Medicine

## 2016-03-23 ENCOUNTER — Ambulatory Visit (INDEPENDENT_AMBULATORY_CARE_PROVIDER_SITE_OTHER): Payer: Commercial Managed Care - HMO | Admitting: Family Medicine

## 2016-03-23 VITALS — BP 120/72 | HR 72 | Temp 98.9°F | Ht 62.0 in | Wt 152.2 lb

## 2016-03-23 DIAGNOSIS — R739 Hyperglycemia, unspecified: Secondary | ICD-10-CM

## 2016-03-23 DIAGNOSIS — E559 Vitamin D deficiency, unspecified: Secondary | ICD-10-CM | POA: Diagnosis not present

## 2016-03-23 DIAGNOSIS — M722 Plantar fascial fibromatosis: Secondary | ICD-10-CM

## 2016-03-23 DIAGNOSIS — E039 Hypothyroidism, unspecified: Secondary | ICD-10-CM

## 2016-03-23 DIAGNOSIS — M810 Age-related osteoporosis without current pathological fracture: Secondary | ICD-10-CM

## 2016-03-23 DIAGNOSIS — E785 Hyperlipidemia, unspecified: Secondary | ICD-10-CM | POA: Diagnosis not present

## 2016-03-23 DIAGNOSIS — I10 Essential (primary) hypertension: Secondary | ICD-10-CM

## 2016-03-23 NOTE — Progress Notes (Signed)
Pre visit review using our clinic review tool, if applicable. No additional management support is needed unless otherwise documented below in the visit note. 

## 2016-03-23 NOTE — Progress Notes (Signed)
Patient ID: Michelle Jacobs, female   DOB: [birth year], 70 y.o.   MRN: [id number]   Subjective:    Patient ID: Michelle Jacobs, female    DOB: 1945/12/18, 70 y.o.   MRN: [id number]  Chief Complaint  Patient presents with  . Follow-up    HPI Patient is in today for follow up. Is feeling well. No recent illness or acute concern except some plantar pain in her right heal secondary to some bad sandles and walking her dog. She is improving and is going to change shoes. Denies CP/palp/SOB/HA/congestion/fevers/GI or GU c/o. Taking meds as prescribed  Past Medical History:  Diagnosis Date  . Allergy   . Annual physical exam 10/06/2015  . GERD (gastroesophageal reflux disease)    mild  . History of chicken pox   . Hyperglycemia 05/20/2013  . Hyperlipidemia   . Hypertension   . Medicare annual wellness visit, subsequent 12/02/2011  . Personal history of colonic polyps 2009   Benign colon polyps  . Preventative health care 12/02/2011    History reviewed. No pertinent surgical history.  Family History  Problem Relation Age of Onset  . Cancer Mother     lung  . Hypertension Mother   . Heart disease Father     CHF  . Diabetes Maternal Uncle     type II  . Heart disease Maternal Uncle     s/p CABG  . Cancer Maternal Grandmother 43    breast  . Heart disease Maternal Grandmother     MI, CHF  . Obesity Maternal Grandmother   . Cancer Sister     uterine  . Hypertension Sister   . Hyperlipidemia Brother   . Hypertension Brother   . Kidney disease Maternal Grandfather     kidney failure  . Cancer Paternal Grandmother   . COPD Paternal Grandfather   . Diabetes Maternal Aunt     older, type 2    Social History   Social History  . Marital status: Married    Spouse name: N/A  . Number of children: 2  . Years of education: N/A   Occupational History  .  [employer] History Main Topics  . Smoking status: Never Smoker  . Smokeless tobacco: Never Used    . Alcohol use 6.0 oz/week    10 Glasses of wine per week  . Drug use: No  . Sexual activity: Not on file     Comment: lives with husband, no dietary restrictions   Other Topics Concern  . Not on file   Social History Narrative   Caffeine use:  2-3 cups coffee daily   Regular exercise:  Walks daily          Outpatient Medications Prior to Visit  Medication Sig Dispense Refill  . alendronate (FOSAMAX) 70 MG tablet TAKE 1 TABLET  EVERY  7  DAYS.  TAKE  WITH  A  FULL  GLASS OF WATER ON AN EMPTY STOMACH. 12 tablet 3  . aspirin EC 81 MG tablet Take 81 mg by mouth daily.      Marland Kitchen atorvastatin (LIPITOR) 10 MG tablet Take 1 tablet (10 mg total) by mouth daily. 90 tablet 3  . Calcium Carbonate-Vitamin D (CALCIUM-D) 600-400 MG-UNIT TABS Take 1 tablet by mouth daily.     . cetirizine (ZYRTEC) 10 MG tablet Take 10 mg by mouth daily.    . fluticasone (FLONASE) 50 MCG/ACT nasal spray Place 2 sprays into both nostrils daily. [place]  g 1  . levothyroxine (SYNTHROID, LEVOTHROID) 125 MCG tablet Take 1 tablet (125 mcg total) by mouth daily. 90 tablet 1  . lisinopril (PRINIVIL,ZESTRIL) 10 MG tablet Take 1 tablet (10 mg total) by mouth daily. 90 tablet 3  . Omega-3 Fatty Acids (FISH OIL) 1200 MG CAPS Take 4 capsules by mouth daily.      Marland Kitchen azithromycin (ZITHROMAX) 250 MG tablet Take 2 tablets by mouth on day 1, followed by 1 tablet by mouth daily for 4 days. 6 tablet 0  . benzonatate (TESSALON) 100 MG capsule Take 1 capsule (100 mg total) by mouth 3 (three) times daily as needed for cough. 21 capsule 0  . oseltamivir (TAMIFLU) 75 MG capsule Take 1 capsule (75 mg total) by mouth 2 (two) times daily. 10 capsule 0   No facility-administered medications prior to visit.     Allergies  Allergen Reactions  . Iodine Rash  . Mercurochrome [Merbromin] Rash  . Sulfa Antibiotics Rash    Review of Systems  Constitutional: Negative for fever and malaise/fatigue.  HENT: Negative for congestion.   Eyes: Negative for  blurred vision.  Respiratory: Negative for shortness of breath.   Cardiovascular: Negative for chest pain, palpitations and leg swelling.  Gastrointestinal: Negative for abdominal pain, blood in stool and nausea.  Genitourinary: Negative for dysuria and frequency.  Musculoskeletal: Negative for falls.  Skin: Negative for rash.  Neurological: Negative for dizziness, loss of consciousness and headaches.  Endo/Heme/Allergies: Negative for environmental allergies.  Psychiatric/Behavioral: Negative for depression. The patient is not nervous/anxious.        Objective:    Physical Exam  Constitutional: She is oriented to person, place, and time. She appears well-developed and well-nourished. No distress.  HENT:  Head: Normocephalic and atraumatic.  Nose: Nose normal.  Eyes: Right eye exhibits no discharge. Left eye exhibits no discharge.  Neck: Normal range of motion. Neck supple.  Cardiovascular: Normal rate and regular rhythm.   No murmur heard. Pulmonary/Chest: Effort normal and breath sounds normal.  Abdominal: Soft. Bowel sounds are normal. There is no tenderness.  Musculoskeletal: She exhibits no edema.  Neurological: She is alert and oriented to person, place, and time.  Skin: Skin is warm and dry.  Psychiatric: She has a normal mood and affect.  Nursing note and vitals reviewed.   BP 120/72 (BP Location: Left Leg, Patient Position: Sitting, Cuff Size: Normal)   Pulse 72   Temp 98.9 F (37.2 C) (Oral)   Ht 5\' 2"  (1.575 m)   Wt 152 lb 4 oz (69.1 kg)   BMI 27.85 kg/m  Wt Readings from Last 3 Encounters:  03/23/16 152 lb 4 oz (69.1 kg)  12/04/15 153 lb 3.2 oz (69.5 kg)  11/14/15 154 lb (69.9 kg)     Lab Results  Component Value Date   WBC 6.5 09/23/2015   HGB 13.9 09/23/2015   HCT 41.8 09/23/2015   PLT 338.0 09/23/2015   GLUCOSE 91 02/19/2015   CHOL 135 09/23/2015   TRIG 118.0 09/23/2015   HDL 66.00 09/23/2015   LDLCALC 45 09/23/2015   ALT 21 02/19/2015   AST  22 02/19/2015   NA 136 02/19/2015   K 4.2 02/19/2015   CL 104 02/19/2015   CREATININE 0.78 02/19/2015   BUN 12 02/19/2015   CO2 25 02/19/2015   TSH 2.73 09/23/2015   HGBA1C 5.1 07/30/2014    Lab Results  Component Value Date   TSH 2.73 09/23/2015   Lab Results  Component Value Date  WBC 6.5 09/23/2015   HGB 13.9 09/23/2015   HCT 41.8 09/23/2015   MCV 94.4 09/23/2015   PLT 338.0 09/23/2015   Lab Results  Component Value Date   NA 136 02/19/2015   K 4.2 02/19/2015   CO2 25 02/19/2015   GLUCOSE 91 02/19/2015   BUN 12 02/19/2015   CREATININE 0.78 02/19/2015   BILITOT 0.9 02/19/2015   ALKPHOS 54 02/19/2015   AST 22 02/19/2015   ALT 21 02/19/2015   PROT 7.1 02/19/2015   ALBUMIN 4.4 02/19/2015   ALBUMIN 4.4 02/19/2015   CALCIUM 9.8 02/19/2015   GFR 78.12 02/19/2015   Lab Results  Component Value Date   CHOL 135 09/23/2015   Lab Results  Component Value Date   HDL 66.00 09/23/2015   Lab Results  Component Value Date   LDLCALC 45 09/23/2015   Lab Results  Component Value Date   TRIG 118.0 09/23/2015   Lab Results  Component Value Date   CHOLHDL 2 09/23/2015   Lab Results  Component Value Date   HGBA1C 5.1 07/30/2014       Assessment & Plan:   Problem List Items Addressed This Visit    HTN (hypertension)    Well controlled, no changes to meds. Encouraged heart healthy diet such as the DASH diet and exercise as tolerated.       Hyperlipidemia    Encouraged heart healthy diet, increase exercise, avoid trans fats, consider a krill oil cap daily      Osteoporosis   Hypothyroidism    On Levothyroxine, continue to monitor      Hyperglycemia    hgba1c acceptable, minimize simple carbs. Increase exercise as tolerated.      Vitamin D deficiency - Primary    Takes daily supplements check levels today      Plantar fasciitis of right foot    Change shoes, stretch and ice and apply Aspercreme bid, if symptoms persist can refer to podiatry         Other Visit Diagnoses   None.     I have discontinued Ms. Hartmann's azithromycin, benzonatate, and oseltamivir. I am also having her maintain her Calcium-D, aspirin EC, Fish Oil, cetirizine, atorvastatin, levothyroxine, lisinopril, alendronate, and fluticasone.  No orders of the defined types were placed in this encounter.    Penni Homans, MD

## 2016-03-23 NOTE — Assessment & Plan Note (Signed)
Encouraged heart healthy diet, increase exercise, avoid trans fats, consider a krill oil cap daily 

## 2016-03-23 NOTE — Assessment & Plan Note (Signed)
Change shoes, stretch and ice and apply Aspercreme bid, if symptoms persist can refer to podiatry

## 2016-03-23 NOTE — Assessment & Plan Note (Signed)
Well controlled, no changes to meds. Encouraged heart healthy diet such as the DASH diet and exercise as tolerated.  °

## 2016-03-23 NOTE — Patient Instructions (Signed)
Ice twice daily then consider applying Salon Pas or Aspercreme to sore heal. Clark's, Merrel's, Crocs   Plantar Fasciitis Plantar fasciitis is a painful foot condition that affects the heel. It occurs when the band of tissue that connects the toes to the heel bone (plantar fascia) becomes irritated. This can happen after exercising too much or doing other repetitive activities (overuse injury). The pain from plantar fasciitis can range from mild irritation to severe pain that makes it difficult for you to walk or move. The pain is usually worse in the morning or after you have been sitting or lying down for a while. CAUSES This condition may be caused by:  Standing for long periods of time.  Wearing shoes that do not fit.  Doing high-impact activities, including running, aerobics, and ballet.  Being overweight.  Having an abnormal way of walking (gait).  Having tight calf muscles.  Having high arches in your feet.  Starting a new athletic activity. SYMPTOMS The main symptom of this condition is heel pain. Other symptoms include:  Pain that gets worse after activity or exercise.  Pain that is worse in the morning or after resting.  Pain that goes away after you walk for a few minutes. DIAGNOSIS This condition may be diagnosed based on your signs and symptoms. Your health care provider will also do a physical exam to check for:  A tender area on the bottom of your foot.  A high arch in your foot.  Pain when you move your foot.  Difficulty moving your foot. You may also need to have imaging studies to confirm the diagnosis. These can include:  X-rays.  Ultrasound.  MRI. TREATMENT  Treatment for plantar fasciitis depends on the severity of the condition. Your treatment may include:  Rest, ice, and over-the-counter pain medicines to manage your pain.  Exercises to stretch your calves and your plantar fascia.  A splint that holds your foot in a stretched, upward  position while you sleep (night splint).  Physical therapy to relieve symptoms and prevent problems in the future.  Cortisone injections to relieve severe pain.  Extracorporeal shock wave therapy (ESWT) to stimulate damaged plantar fascia with electrical impulses. It is often used as a last resort before surgery.  Surgery, if other treatments have not worked after 12 months. HOME CARE INSTRUCTIONS  Take medicines only as directed by your health care provider.  Avoid activities that cause pain.  Roll the bottom of your foot over a bag of ice or a bottle of cold water. Do this for 20 minutes, 3-4 times a day.  Perform simple stretches as directed by your health care provider.  Try wearing athletic shoes with air-sole or gel-sole cushions or soft shoe inserts.  Wear a night splint while sleeping, if directed by your health care provider.  Keep all follow-up appointments with your health care provider. PREVENTION   Do not perform exercises or activities that cause heel pain.  Consider finding low-impact activities if you continue to have problems.  Lose weight if you need to. The best way to prevent plantar fasciitis is to avoid the activities that aggravate your plantar fascia. SEEK MEDICAL CARE IF:  Your symptoms do not go away after treatment with home care measures.  Your pain gets worse.  Your pain affects your ability to move or do your daily activities.   This information is not intended to replace advice given to you by your health care provider. Make sure you discuss any questions you  have with your health care provider.   Document Released: 05/12/2001 Document Revised: 05/08/2015 Document Reviewed: 06/27/2014 Elsevier Interactive Patient Education [employer].

## 2016-03-23 NOTE — Assessment & Plan Note (Signed)
On Levothyroxine, continue to monitor 

## 2016-03-23 NOTE — Assessment & Plan Note (Signed)
Takes daily supplements check levels today

## 2016-03-23 NOTE — Assessment & Plan Note (Signed)
hgba1c acceptable, minimize simple carbs. Increase exercise as tolerated.  

## 2016-08-10 ENCOUNTER — Telehealth: Payer: Self-pay | Admitting: Family Medicine

## 2016-08-10 MED ORDER — ATORVASTATIN CALCIUM 10 MG PO TABS: 10.0000 mg | ORAL_TABLET | Freq: Every day | ORAL | 3 refills | Status: DC

## 2016-08-10 MED ORDER — LISINOPRIL 10 MG PO TABS: 10.0000 mg | ORAL_TABLET | Freq: Every day | ORAL | 3 refills | Status: DC

## 2016-08-10 NOTE — Telephone Encounter (Signed)
Caller name: Relationship to patient: Self Can be reached: [phone]  Pharmacy:  [facility], [place] [phone] (Phone) [phone] (Fax)     Reason for call: Refill atorvastatin (LIPITOR) 10 MG tablet [id number]  lisinopril (PRINIVIL,ZESTRIL) 10 MG tablet [id number]

## 2016-08-10 NOTE — Telephone Encounter (Signed)
Refills done to [facility]  Patient informed

## 2016-09-23 ENCOUNTER — Telehealth: Payer: Self-pay | Admitting: *Deleted

## 2016-09-23 NOTE — Telephone Encounter (Signed)
Scheduled for 09/25/16 @10 

## 2016-09-24 NOTE — Progress Notes (Signed)
Subjective:   Michelle Jacobs is a 71 y.o. female who presents for Medicare Annual (Subsequent) preventive examination.  Review of Systems:  No ROS.  Medicare Wellness Visit.  Cardiac Risk Factors include: advanced age (>20men, >80 women);dyslipidemia;hypertension Sleep patterns: Sleeps 8 hrs. Feels rested. Home Safety/Smoke Alarms:  Feels safe in home. Smoke alarms in place.  Living environment; residence and Firearm Safety: Lives at home with husband. No guns. Seat Belt Safety/Bike Helmet: Wears seat belt.   Counseling:   Eye Exam- [facility] as needed. Dental- Dr.Tkatch every 6 months.  Female:   Pap- No longer getting screened. Mammo-Last 01/24/14: BI-RADS CATEGORY  1: Negative  ORDER PLACED  Dexa scan- Last 02/11/14: osteoporosis.  ORDER PLACED  CCS- Last 12/19/08: Hx of polyps. Result not on file.     Objective:     Vitals: BP 116/62 (BP Location: Left Arm, Patient Position: Sitting, Cuff Size: Normal)   Pulse 61   Temp 98.3 F (36.8 C) (Oral)   Ht 5\' 5"  (1.651 m)   Wt 156 lb 6.4 oz (70.9 kg)   SpO2 93%   BMI 26.03 kg/m   Body mass index is 26.03 kg/m.   Tobacco History  Smoking Status  . Never Smoker  Smokeless Tobacco  . Never Used     Counseling given: Not Answered   Past Medical History:  Diagnosis Date  . Allergy   . Annual physical exam 10/06/2015  . GERD (gastroesophageal reflux disease)    mild  . History of chicken pox   . Hyperglycemia 05/20/2013  . Hyperlipidemia   . Hypertension   . Medicare annual wellness visit, subsequent 12/02/2011  . Personal history of colonic polyps 2009   Benign colon polyps  . Preventative health care 12/02/2011   History reviewed. No pertinent surgical history. Family History  Problem Relation Age of Onset  . Cancer Mother     lung  . Hypertension Mother   . Heart disease Father     CHF  . Diabetes Maternal Uncle     type II  . Heart disease Maternal Uncle     s/p CABG  . Cancer Maternal  Grandmother 87    breast  . Heart disease Maternal Grandmother     MI, CHF  . Obesity Maternal Grandmother   . Cancer Sister     uterine  . Hypertension Sister   . Hyperlipidemia Brother   . Hypertension Brother   . Cancer Brother     HPV associated oral cancer  . Hearing loss Brother   . Kidney disease Maternal Grandfather     kidney failure  . Cancer Paternal Grandmother   . COPD Paternal Grandfather   . Diabetes Maternal Aunt     older, type 2   History  Sexual Activity  . Sexual activity: Not on file    Comment: lives with husband, no dietary restrictions    Outpatient Encounter Prescriptions as of 09/25/2016  Medication Sig  . aspirin EC 81 MG tablet Take 81 mg by mouth daily.    Marland Kitchen atorvastatin (LIPITOR) 10 MG tablet Take 1 tablet (10 mg total) by mouth daily.  . Calcium Carbonate-Vitamin D (CALCIUM-D) 600-400 MG-UNIT TABS Take 1 tablet by mouth daily.   . cetirizine (ZYRTEC) 10 MG tablet Take 10 mg by mouth daily.  Marland Kitchen denosumab (PROLIA) 60 MG/ML SOLN injection Inject 60 mg into the skin every 6 (six) months. Administer in upper arm, thigh, or abdomen  . fluticasone (FLONASE) 50 MCG/ACT  nasal spray Place 2 sprays into both nostrils daily.  Javier Docker Oil 1000 MG CAPS Take 1,000 mg by mouth daily.  Marland Kitchen levothyroxine (SYNTHROID, LEVOTHROID) 125 MCG tablet Take 1 tablet (125 mcg total) by mouth daily.  Marland Kitchen lisinopril (PRINIVIL,ZESTRIL) 10 MG tablet Take 1 tablet (10 mg total) by mouth daily.  . Multiple Vitamin (MULTIVITAMIN) tablet Take 1 tablet by mouth daily.  . Omega-3 Fatty Acids (FISH OIL) 1200 MG CAPS Take 4 capsules by mouth daily.    . [DISCONTINUED] alendronate (FOSAMAX) 70 MG tablet TAKE 1 TABLET  EVERY  7  DAYS.  TAKE  WITH  A  FULL  GLASS OF WATER ON AN EMPTY STOMACH.   No facility-administered encounter medications on file as of 09/25/2016.     Activities of Daily Living In your present state of health, do you have any difficulty performing the following activities:  09/25/2016 11/14/2015  Hearing? N N  Vision? N N  Difficulty concentrating or making decisions? N N  Walking or climbing stairs? N N  Dressing or bathing? N N  Doing errands, shopping? N N  Preparing Food and eating ? N -  Using the Toilet? N -  In the past six months, have you accidently leaked urine? N -  Do you have problems with loss of bowel control? N -  Managing your Medications? N -  Managing your Finances? N -  Housekeeping or managing your Housekeeping? N -  Some recent data might be hidden    Patient Care Team: Mosie Lukes, MD as PCP - General (Family Medicine)    Assessment:    Physical assessment deferred to PCP.  Exercise Activities and Dietary recommendations Current Exercise Habits: Home exercise routine (Walks dog 2x day.)   Diet (meal preparation, eat out, water intake, caffeinated beverages, dairy products, fruits and vegetables): in general, a "healthy" diet  , on average, .3 meals per day   Goals    . Quit work this summer.      Fall Risk Fall Risk  09/25/2016 11/14/2015 09/23/2015 01/23/2014  Falls in the past year? No Yes Yes No  Number falls in past yr: - 1 1 -  Injury with Fall? - No No -  Risk for fall due to : - Impaired balance/gait - -  Follow up - Falls prevention discussed - -   Depression Screen PHQ 2/9 Scores 09/25/2016 11/14/2015 09/23/2015 01/23/2014  PHQ - 2 Score 0 0 0 0  Exception Documentation - Patient refusal - -     Cognitive Function MMSE - Mini Mental State Exam 09/25/2016  Orientation to time 5  Orientation to Place 5  Registration 3  Attention/ Calculation 5  Recall 3  Language- name 2 objects 2  Language- repeat 1  Language- follow 3 step command 3  Language- read & follow direction 1  Write a sentence 1  Copy design 1  Total score 30        Immunization History  Administered Date(s) Administered  . Influenza Split 05/30/2012  . Influenza, High Dose Seasonal PF 07/20/2014, 09/25/2016  . Influenza,inj,Quad  PF,36+ Mos 05/15/2013, 09/23/2015  . Pneumococcal Conjugate-13 07/30/2014  . Pneumococcal Polysaccharide-23 09/23/2015  . Tdap 09/03/2011  . Zoster 03/28/2013   Screening Tests Health Maintenance  Topic Date Due  . MAMMOGRAM  01/25/2016  . INFLUENZA VACCINE  03/31/2016  . COLONOSCOPY  12/20/2018  . TETANUS/TDAP  09/02/2021  . DEXA SCAN  Completed  . ZOSTAVAX  Completed  . Hepatitis C  Screening  Completed  . PNA vac Low Risk Adult  Completed     Plan:     Schedule Mammogram and Bone Density Scan-orders placed today.  Continue to eat heart healthy diet (full of fruits, vegetables, whole grains, lean protein, water--limit salt, fat, and sugar intake) and increase physical activity as tolerated.  Continue doing brain stimulating activities (puzzles, reading, adult coloring books, staying active) to keep memory sharp.     During the course of the visit the patient was educated and counseled about the following appropriate screening and preventive services:   Vaccines to include Pneumoccal, Influenza, Hepatitis B, Td, Zostavax, HCV  Electrocardiogram  Cardiovascular Disease  Colorectal cancer screening  Bone density screening  Diabetes screening  Glaucoma screening  Mammography/PAP  Nutrition counseling   Patient Instructions (the written plan) was given to the patient.   Shela Nevin, [place]  09/25/2016

## 2016-09-24 NOTE — Progress Notes (Signed)
Pre visit review using our clinic review tool, if applicable. No additional management support is needed unless otherwise documented below in the visit note. 

## 2016-09-25 ENCOUNTER — Encounter: Payer: Self-pay | Admitting: Family Medicine

## 2016-09-25 ENCOUNTER — Telehealth: Payer: Self-pay | Admitting: Family Medicine

## 2016-09-25 ENCOUNTER — Ambulatory Visit (INDEPENDENT_AMBULATORY_CARE_PROVIDER_SITE_OTHER): Payer: Commercial Managed Care - HMO | Admitting: Family Medicine

## 2016-09-25 VITALS — BP 116/62 | HR 61 | Temp 98.3°F | Ht 65.0 in | Wt 156.4 lb

## 2016-09-25 DIAGNOSIS — Z1231 Encounter for screening mammogram for malignant neoplasm of breast: Secondary | ICD-10-CM | POA: Diagnosis not present

## 2016-09-25 DIAGNOSIS — I10 Essential (primary) hypertension: Secondary | ICD-10-CM | POA: Diagnosis not present

## 2016-09-25 DIAGNOSIS — Z0000 Encounter for general adult medical examination without abnormal findings: Secondary | ICD-10-CM

## 2016-09-25 DIAGNOSIS — E782 Mixed hyperlipidemia: Secondary | ICD-10-CM

## 2016-09-25 DIAGNOSIS — E039 Hypothyroidism, unspecified: Secondary | ICD-10-CM

## 2016-09-25 DIAGNOSIS — M810 Age-related osteoporosis without current pathological fracture: Secondary | ICD-10-CM

## 2016-09-25 DIAGNOSIS — Z23 Encounter for immunization: Secondary | ICD-10-CM | POA: Diagnosis not present

## 2016-09-25 DIAGNOSIS — E559 Vitamin D deficiency, unspecified: Secondary | ICD-10-CM | POA: Diagnosis not present

## 2016-09-25 DIAGNOSIS — R739 Hyperglycemia, unspecified: Secondary | ICD-10-CM | POA: Diagnosis not present

## 2016-09-25 DIAGNOSIS — Z1239 Encounter for other screening for malignant neoplasm of breast: Secondary | ICD-10-CM

## 2016-09-25 LAB — LIPID PANEL
CHOLESTEROL: 134 mg/dL (ref 0–200)
HDL: 62.2 mg/dL (ref >39.00)
LDL Cholesterol: 51 mg/dL (ref 0–99)
NonHDL: 71.31
Total CHOL/HDL Ratio: 2
Triglycerides: 103 mg/dL (ref 0.0–149.0)
VLDL: 20.6 mg/dL (ref 0.0–40.0)

## 2016-09-25 LAB — CBC
HEMATOCRIT: 38.9 % (ref 36.0–46.0)
HEMOGLOBIN: 13.4 g/dL (ref 12.0–15.0)
MCHC: 34.6 g/dL (ref 30.0–36.0)
MCV: 92.5 fl (ref 78.0–100.0)
Platelets: 297 10*3/uL (ref 150.0–400.0)
RBC: 4.2 Mil/uL (ref 3.87–5.11)
RDW: 12.9 % (ref 11.5–15.5)
WBC: 5.9 10*3/uL (ref 4.0–10.5)

## 2016-09-25 LAB — COMPREHENSIVE METABOLIC PANEL
ALBUMIN: 4.7 g/dL (ref 3.5–5.2)
ALT: 24 U/L (ref 0–35)
AST: 23 U/L (ref 0–37)
Alkaline Phosphatase: 51 U/L (ref 39–117)
BILIRUBIN TOTAL: 1 mg/dL (ref 0.2–1.2)
BUN: 12 mg/dL (ref 6–23)
CHLORIDE: 103 meq/L (ref 96–112)
CO2: 29 mEq/L (ref 19–32)
CREATININE: 0.83 mg/dL (ref 0.40–1.20)
Calcium: 10 mg/dL (ref 8.4–10.5)
GFR: 72.37 mL/min (ref >60.00)
Glucose, Bld: 98 mg/dL (ref 70–99)
Potassium: 4 mEq/L (ref 3.5–5.1)
Sodium: 139 mEq/L (ref 135–145)
TOTAL PROTEIN: 7.5 g/dL (ref 6.0–8.3)

## 2016-09-25 LAB — VITAMIN D 25 HYDROXY (VIT D DEFICIENCY, FRACTURES): VITD: 31.59 ng/mL (ref 30.00–100.00)

## 2016-09-25 LAB — TSH: TSH: 6.38 u[IU]/mL — ABNORMAL HIGH (ref 0.35–4.50)

## 2016-09-25 LAB — HEMOGLOBIN A1C: HEMOGLOBIN A1C: 5.1 % (ref 4.6–6.5)

## 2016-09-25 NOTE — Assessment & Plan Note (Signed)
Encouraged to get adequate exercise, calcium and vitamin d intake 

## 2016-09-25 NOTE — Assessment & Plan Note (Signed)
Tolerating statin, encouraged heart healthy diet, avoid trans fats, minimize simple carbs and saturated fats. Increase exercise as tolerated 

## 2016-09-25 NOTE — Assessment & Plan Note (Signed)
Well controlled, no changes to meds. Encouraged heart healthy diet such as the DASH diet and exercise as tolerated.  °

## 2016-09-25 NOTE — Assessment & Plan Note (Signed)
MGM ordered today

## 2016-09-25 NOTE — Progress Notes (Signed)
Subjective:    Patient ID: Michelle Jacobs, female    DOB: Mar 16, 1946, 71 y.o.   MRN: [id number]  Chief Complaint  Patient presents with  . Annual Exam  I acted as a Education administrator for Dr. Charlett Blake. Princess, RMA   HPI Patient is in today for annual exam and follow up on chronic medical concerns including hyperlipidemia, HTN, hypothyroidism, osteoporosis and Vitamin D deficiency. Her only acute concern is a know on the outside edge of her right foot that is mildly tender with weight baring. No injury, redness or warmth. Denies CP/palp/SOB/HA/congestion/fevers/GI or GU c/o. Taking meds as prescribed  Past Medical History:  Diagnosis Date  . Allergy   . Annual physical exam 10/06/2015  . GERD (gastroesophageal reflux disease)    mild  . History of chicken pox   . Hyperglycemia 05/20/2013  . Hyperlipidemia   . Hypertension   . Medicare annual wellness visit, subsequent 12/02/2011  . Personal history of colonic polyps 2009   Benign colon polyps  . Preventative health care 12/02/2011    History reviewed. No pertinent surgical history.  Family History  Problem Relation Age of Onset  . Cancer Mother     lung  . Hypertension Mother   . Heart disease Father     CHF  . Diabetes Maternal Uncle     type II  . Heart disease Maternal Uncle     s/p CABG  . Cancer Maternal Grandmother 39    breast  . Heart disease Maternal Grandmother     MI, CHF  . Obesity Maternal Grandmother   . Cancer Sister     uterine  . Hypertension Sister   . Hyperlipidemia Brother   . Hypertension Brother   . Cancer Brother     HPV associated oral cancer  . Hearing loss Brother   . Kidney disease Maternal Grandfather     kidney failure  . Cancer Paternal Grandmother   . COPD Paternal Grandfather   . Diabetes Maternal Aunt     older, type 2    Social History   Social History  . Marital status: Married    Spouse name: N/A  . Number of children: 2  . Years of education: N/A   Occupational History  .   [employer] History Main Topics  . Smoking status: Never Smoker  . Smokeless tobacco: Never Used  . Alcohol use 6.0 oz/week    10 Glasses of wine per week  . Drug use: No  . Sexual activity: Not on file     Comment: lives with husband, no dietary restrictions   Other Topics Concern  . Not on file   Social History Narrative   Caffeine use:  2-3 cups coffee daily   Regular exercise:  Walks daily          Outpatient Medications Prior to Visit  Medication Sig Dispense Refill  . aspirin EC 81 MG tablet Take 81 mg by mouth daily.      Marland Kitchen atorvastatin (LIPITOR) 10 MG tablet Take 1 tablet (10 mg total) by mouth daily. 90 tablet 3  . Calcium Carbonate-Vitamin D (CALCIUM-D) 600-400 MG-UNIT TABS Take 1 tablet by mouth daily.     . cetirizine (ZYRTEC) 10 MG tablet Take 10 mg by mouth daily.    . fluticasone (FLONASE) 50 MCG/ACT nasal spray Place 2 sprays into both nostrils daily. 16 g 1  . levothyroxine (SYNTHROID, LEVOTHROID) 125 MCG tablet Take 1 tablet (125 mcg total)  by mouth daily. 90 tablet 1  . lisinopril (PRINIVIL,ZESTRIL) 10 MG tablet Take 1 tablet (10 mg total) by mouth daily. 90 tablet 3  . Omega-3 Fatty Acids (FISH OIL) 1200 MG CAPS Take 4 capsules by mouth daily.      Marland Kitchen alendronate (FOSAMAX) 70 MG tablet TAKE 1 TABLET  EVERY  7  DAYS.  TAKE  WITH  A  FULL  GLASS OF WATER ON AN EMPTY STOMACH. 12 tablet 3   No facility-administered medications prior to visit.     Allergies  Allergen Reactions  . Iodine Rash  . Mercurochrome [Merbromin] Rash  . Sulfa Antibiotics Rash    Review of Systems  Constitutional: Negative for fever and malaise/fatigue.  HENT: Negative for congestion.   Eyes: Negative for blurred vision.  Respiratory: Negative for cough and shortness of breath.   Cardiovascular: Negative for chest pain, palpitations and leg swelling.  Gastrointestinal: Negative for vomiting.  Musculoskeletal: Positive for joint pain. Negative for back pain.   Skin: Negative for rash.  Neurological: Negative for loss of consciousness and headaches.       Objective:    Physical Exam  Constitutional: She is oriented to person, place, and time. She appears well-developed and well-nourished. No distress.  HENT:  Head: Normocephalic and atraumatic.  Eyes: Conjunctivae are normal.  Neck: Normal range of motion. No thyromegaly present.  Cardiovascular: Normal rate and regular rhythm.   Pulmonary/Chest: Effort normal and breath sounds normal. She has no wheezes.  Abdominal: Soft. Bowel sounds are normal. There is no tenderness.  Musculoskeletal: Normal range of motion. She exhibits no edema or deformity.  Neurological: She is alert and oriented to person, place, and time.  Skin: Skin is warm and dry. She is not diaphoretic.  Psychiatric: She has a normal mood and affect.    BP 116/62 (BP Location: Left Arm, Patient Position: Sitting, Cuff Size: Normal)   Pulse 61   Temp 98.3 F (36.8 C) (Oral)   Ht 5\' 5"  (1.651 m)   Wt 156 lb 6.4 oz (70.9 kg)   SpO2 93%   BMI 26.03 kg/m  Wt Readings from Last 3 Encounters:  09/25/16 156 lb 6.4 oz (70.9 kg)  03/23/16 152 lb 4 oz (69.1 kg)  12/04/15 153 lb 3.2 oz (69.5 kg)     Lab Results  Component Value Date   WBC 5.9 09/25/2016   HGB 13.4 09/25/2016   HCT 38.9 09/25/2016   PLT 297.0 09/25/2016   GLUCOSE 98 09/25/2016   CHOL 134 09/25/2016   TRIG 103.0 09/25/2016   HDL 62.20 09/25/2016   LDLCALC 51 09/25/2016   ALT 24 09/25/2016   AST 23 09/25/2016   NA 139 09/25/2016   K 4.0 09/25/2016   CL 103 09/25/2016   CREATININE 0.83 09/25/2016   BUN 12 09/25/2016   CO2 29 09/25/2016   TSH 6.38 (H) 09/25/2016   HGBA1C 5.1 09/25/2016    Lab Results  Component Value Date   TSH 6.38 (H) 09/25/2016   Lab Results  Component Value Date   WBC 5.9 09/25/2016   HGB 13.4 09/25/2016   HCT 38.9 09/25/2016   MCV 92.5 09/25/2016   PLT 297.0 09/25/2016   Lab Results  Component Value Date   NA  139 09/25/2016   K 4.0 09/25/2016   CO2 29 09/25/2016   GLUCOSE 98 09/25/2016   BUN 12 09/25/2016   CREATININE 0.83 09/25/2016   BILITOT 1.0 09/25/2016   ALKPHOS 51 09/25/2016   AST 23  09/25/2016   ALT 24 09/25/2016   PROT 7.5 09/25/2016   ALBUMIN 4.7 09/25/2016   CALCIUM 10.0 09/25/2016   GFR 72.37 09/25/2016   Lab Results  Component Value Date   CHOL 134 09/25/2016   Lab Results  Component Value Date   HDL 62.20 09/25/2016   Lab Results  Component Value Date   LDLCALC 51 09/25/2016   Lab Results  Component Value Date   TRIG 103.0 09/25/2016   Lab Results  Component Value Date   CHOLHDL 2 09/25/2016   Lab Results  Component Value Date   HGBA1C 5.1 09/25/2016       Assessment & Plan:   Problem List Items Addressed This Visit    Osteoporosis (Chronic)    Encouraged to get adequate exercise, calcium and vitamin d intake      Relevant Medications   denosumab (PROLIA) 60 MG/ML SOLN injection   Other Relevant Orders   DG Bone Density   HTN (hypertension)    Well controlled, no changes to meds. Encouraged heart healthy diet such as the DASH diet and exercise as tolerated.       Relevant Orders   CBC (Completed)   Comprehensive metabolic panel (Completed)   Hyperlipidemia    Tolerating statin, encouraged heart healthy diet, avoid trans fats, minimize simple carbs and saturated fats. Increase exercise as tolerated      Relevant Orders   Lipid panel (Completed)   Breast cancer screening    MGM ordered today      Relevant Orders   MM Digital Screening   Hypothyroidism    On Levothyroxine, continue to monitor      Relevant Orders   TSH (Completed)   Hyperglycemia - Primary   Relevant Orders   Hemoglobin A1c (Completed)   Annual physical exam    Patient encouraged to maintain heart healthy diet, regular exercise, adequate sleep. Consider daily probiotics. Take medications as prescribed      Vitamin D deficiency    Encouraged to get adequate  exercise, calcium and vitamin d intake. Vitamin D 2000 IU daily      Relevant Orders   VITAMIN D 25 Hydroxy (Vit-D Deficiency, Fractures) (Completed)    Other Visit Diagnoses    Encounter for immunization       Relevant Medications   Multiple Vitamin (MULTIVITAMIN) tablet   Other Relevant Orders   Flu vaccine HIGH DOSE PF (Completed)   Lipid panel (Completed)   CBC (Completed)   Comprehensive metabolic panel (Completed)   TSH (Completed)   VITAMIN D 25 Hydroxy (Vit-D Deficiency, Fractures) (Completed)   Hemoglobin A1c (Completed)      I have discontinued Ms. Kawai's alendronate. I am also having her maintain her Calcium-D, aspirin EC, Fish Oil, cetirizine, levothyroxine, fluticasone, atorvastatin, lisinopril, multivitamin, Krill Oil, and denosumab.  Meds ordered this encounter  Medications  . Multiple Vitamin (MULTIVITAMIN) tablet    Sig: Take 1 tablet by mouth daily.  Javier Docker Oil 1000 MG CAPS    Sig: Take 1,000 mg by mouth daily.  Marland Kitchen denosumab (PROLIA) 60 MG/ML SOLN injection    Sig: Inject 60 mg into the skin every 6 (six) months. Administer in upper arm, thigh, or abdomen    CMA served as scribe during this visit. History, Physical and Plan performed by medical provider. Documentation and orders reviewed and attested to.  Penni Homans, MD

## 2016-09-25 NOTE — Patient Instructions (Signed)
Preventive Care 70 Years and Older, Female Preventive care refers to lifestyle choices and visits with your health care provider that can promote health and wellness. What does preventive care include?  A yearly physical exam. This is also called an annual well check.  Dental exams once or twice a year.  Routine eye exams. Ask your health care provider how often you should have your eyes checked.  Personal lifestyle choices, including:  Daily care of your teeth and gums.  Regular physical activity.  Eating a healthy diet.  Avoiding tobacco and drug use.  Limiting alcohol use.  Practicing safe sex.  Taking low-dose aspirin every day.  Taking vitamin and mineral supplements as recommended by your health care provider. What happens during an annual well check? The services and screenings done by your health care provider during your annual well check will depend on your age, overall health, lifestyle risk factors, and family history of disease. Counseling  Your health care provider may ask you questions about your:  Alcohol use.  Tobacco use.  Drug use.  Emotional well-being.  Home and relationship well-being.  Sexual activity.  Eating habits.  History of falls.  Memory and ability to understand (cognition).  Work and work environment.  Reproductive health. Screening  You may have the following tests or measurements:  Height, weight, and BMI.  Blood pressure.  Lipid and cholesterol levels. These may be checked every 5 years, or more frequently if you are over 50 years old.  Skin check.  Lung cancer screening. You may have this screening every year starting at age 55 if you have a 30-pack-year history of smoking and currently smoke or have quit within the past 15 years.  Fecal occult blood test (FOBT) of the stool. You may have this test every year starting at age 50.  Flexible sigmoidoscopy or colonoscopy. You may have a sigmoidoscopy every 5 years or  a colonoscopy every 10 years starting at age 50.  Hepatitis C blood test.  Hepatitis B blood test.  Sexually transmitted disease (STD) testing.  Diabetes screening. This is done by checking your blood sugar (glucose) after you have not eaten for a while (fasting). You may have this done every 1-3 years.  Bone density scan. This is done to screen for osteoporosis. You may have this done starting at age 65.  Mammogram. This may be done every 1-2 years. Talk to your health care provider about how often you should have regular mammograms. Talk with your health care provider about your test results, treatment options, and if necessary, the need for more tests. Vaccines  Your health care provider may recommend certain vaccines, such as:  Influenza vaccine. This is recommended every year.  Tetanus, diphtheria, and acellular pertussis (Tdap, Td) vaccine. You may need a Td booster every 10 years.  Varicella vaccine. You may need this if you have not been vaccinated.  Zoster vaccine. You may need this after age 60.  Measles, mumps, and rubella (MMR) vaccine. You may need at least one dose of MMR if you were born in 1957 or later. You may also need a second dose.  Pneumococcal 13-valent conjugate (PCV13) vaccine. One dose is recommended after age 65.  Pneumococcal polysaccharide (PPSV23) vaccine. One dose is recommended after age 65.  Meningococcal vaccine. You may need this if you have certain conditions.  Hepatitis A vaccine. You may need this if you have certain conditions or if you travel or work in places where you may be exposed to   hepatitis A.  Hepatitis B vaccine. You may need this if you have certain conditions or if you travel or work in places where you may be exposed to hepatitis B.  Haemophilus influenzae type b (Hib) vaccine. You may need this if you have certain conditions. Talk to your health care provider about which screenings and vaccines you need and how often you need  them. This information is not intended to replace advice given to you by your health care provider. Make sure you discuss any questions you have with your health care provider. Document Released: 09/13/2015 Document Revised: 05/06/2016 Document Reviewed: 06/18/2015 Elsevier Interactive Patient Education  2017 Else[employer] Inc[employer]  

## 2016-09-25 NOTE — Assessment & Plan Note (Signed)
On Levothyroxine, continue to monitor 

## 2016-09-25 NOTE — Progress Notes (Signed)
Pre visit review using our clinic review tool, if applicable. No additional management support is needed unless otherwise documented below in the visit note. 

## 2016-09-25 NOTE — Telephone Encounter (Signed)
Submitted patients Benefits for Prolia

## 2016-09-25 NOTE — Assessment & Plan Note (Addendum)
Encouraged to get adequate exercise, calcium and vitamin d intake. Vitamin D 2000 IU daily

## 2016-09-25 NOTE — Assessment & Plan Note (Signed)
Patient encouraged to maintain heart healthy diet, regular exercise, adequate sleep. Consider daily probiotics. Take medications as prescribed 

## 2016-09-29 ENCOUNTER — Other Ambulatory Visit: Payer: Self-pay | Admitting: Family Medicine

## 2016-09-29 DIAGNOSIS — E039 Hypothyroidism, unspecified: Secondary | ICD-10-CM

## 2016-09-29 NOTE — Telephone Encounter (Signed)
Patients benefits verified [place] required-process initiated with [employer] PA assistance (fax clinical info and dexa to [phone])  Admin and Prolia subject to 20% co-insurance  Patient eligible for assistance thru [facility]- [phone]

## 2016-10-01 ENCOUNTER — Ambulatory Visit (HOSPITAL_BASED_OUTPATIENT_CLINIC_OR_DEPARTMENT_OTHER)
Admission: RE | Admit: 2016-10-01 | Discharge: 2016-10-01 | Disposition: A | Discharge status: Home / Self Care | Payer: Medicare HMO | Source: Ambulatory Visit | Attending: Family Medicine | Admitting: Family Medicine

## 2016-10-01 DIAGNOSIS — Z1239 Encounter for other screening for malignant neoplasm of breast: Secondary | ICD-10-CM

## 2016-10-01 DIAGNOSIS — Z1231 Encounter for screening mammogram for malignant neoplasm of breast: Secondary | ICD-10-CM | POA: Diagnosis not present

## 2016-10-01 DIAGNOSIS — M810 Age-related osteoporosis without current pathological fracture: Secondary | ICD-10-CM | POA: Diagnosis not present

## 2016-10-16 ENCOUNTER — Ambulatory Visit: Payer: Commercial Managed Care - HMO

## 2016-11-02 ENCOUNTER — Other Ambulatory Visit: Payer: Self-pay | Admitting: Family Medicine

## 2016-11-03 ENCOUNTER — Ambulatory Visit (INDEPENDENT_AMBULATORY_CARE_PROVIDER_SITE_OTHER): Payer: Medicare HMO | Admitting: Family Medicine

## 2016-11-03 ENCOUNTER — Encounter: Payer: Self-pay | Admitting: Family Medicine

## 2016-11-03 DIAGNOSIS — I10 Essential (primary) hypertension: Secondary | ICD-10-CM | POA: Diagnosis not present

## 2016-11-03 DIAGNOSIS — B070 Plantar wart: Secondary | ICD-10-CM | POA: Diagnosis not present

## 2016-11-03 DIAGNOSIS — R739 Hyperglycemia, unspecified: Secondary | ICD-10-CM

## 2016-11-03 MED ORDER — ALENDRONATE SODIUM 70 MG PO TABS: 70.0000 mg | ORAL_TABLET | ORAL | 3 refills | Status: DC

## 2016-11-03 NOTE — Patient Instructions (Signed)
Folic Acid 1 mg tab daily Wart bandaids, impregnated with salicylic acid each evening During the day apply corn pads and lidocaine Experiment with footwear If it continues to be symptomatic call for referral to podiatry Plantar Warts Plantar warts are small growths on the bottom of the foot (sole). Warts are caused by a type of germ (virus). Most warts are not painful, and they usually do not cause problems. Sometimes, plantar warts can cause pain when you walk. Warts often go away on their own in time. Treatments may be done if needed. Follow these instructions at home: General instructions   Apply creams or solutions only as told by your doctor. Follow these steps if your doctor tells you to do so:  Soak your foot in warm water.  Remove the top layer of softened skin before you apply the medicine. You can use a pumice stone to remove the tissue.  After you apply the medicine, put a bandage over the area of the wart.  Repeat the process every day or as told by your doctor.  Do not scratch or pick at a wart.  Wash your hands after you touch a wart.  If a wart is painful, try putting a bandage with a hole in the middle over the wart.  Keep all follow-up visits as told by your doctor. This is important. Prevention   Wear shoes and socks. Change socks every day.  Keep your feet clean and dry.  Check your feet often.  Avoid direct contact with warts on other people. Contact a doctor if:  Your warts do not improve after treatment.  You have redness, swelling, or pain at the site of a wart.  You have bleeding from a wart, and the bleeding does not stop when you put light pressure on the wart.  You have diabetes and you get a wart. This information is not intended to replace advice given to you by your health care provider. Make sure you discuss any questions you have with your health care provider. Document Released: 09/19/2010 Document Revised: 01/23/2016 Document Reviewed:  11/12/2014 Elsevier Interactive Patient Education  2017 [employer].

## 2016-11-03 NOTE — Progress Notes (Signed)
Patient ID: Michelle Jacobs, female   DOB: 10-07-1945, 71 y.o.   MRN: [id number]   Subjective:  I acted as a Education administrator for Penni Homans, [place], [place]   Patient ID: Michelle Jacobs, female    DOB: [birth year], 71 y.o.   MRN: [id number]  Chief Complaint  Patient presents with  . Toe Pain    Growth on foot. Painful and sore to the touch. x 2 weeks ago.    HPI  Patient is in today for an acute visit for a growth on left foot (x two weeks). Patient denies any injury to feet. States that her left foot is sore and painful. Has placed a cotton ball in between her toes to alleviate the pain. Patient has no additional acute concerns noted at this time. Her toe does not hurt at this time but did hurt yesterday. No warmth or discharge. Denies CP/palp/SOB/HA/congestion/fevers/GI or GU c/o. Taking meds as prescribed  Patient Care Team: Mosie Lukes, MD as PCP - General (Family Medicine)   Past Medical History:  Diagnosis Date  . Allergy   . Annual physical exam 10/06/2015  . GERD (gastroesophageal reflux disease)    mild  . History of chicken pox   . Hyperglycemia 05/20/2013  . Hyperlipidemia   . Hypertension   . Medicare annual wellness visit, subsequent 12/02/2011  . Personal history of colonic polyps 2009   Benign colon polyps  . Preventative health care 12/02/2011  . Verrucous skin lesion 11/04/2016    No past surgical history on file.  Family History  Problem Relation Age of Onset  . Cancer Mother     lung  . Hypertension Mother   . Heart disease Father     CHF  . Diabetes Maternal Uncle     type II  . Heart disease Maternal Uncle     s/p CABG  . Cancer Maternal Grandmother 72    breast  . Heart disease Maternal Grandmother     MI, CHF  . Obesity Maternal Grandmother   . Cancer Sister     uterine  . Hypertension Sister   . Hyperlipidemia Brother   . Hypertension Brother   . Cancer Brother     HPV associated oral cancer  . Hearing loss Brother   . Kidney disease  Maternal Grandfather     kidney failure  . Cancer Paternal Grandmother   . COPD Paternal Grandfather   . Diabetes Maternal Aunt     older, type 2    Social History   Social History  . Marital status: Married    Spouse name: N/A  . Number of children: 2  . Years of education: N/A   Occupational History  .  [employer] History Main Topics  . Smoking status: Never Smoker  . Smokeless tobacco: Never Used  . Alcohol use 6.0 oz/week    10 Glasses of wine per week  . Drug use: No  . Sexual activity: Not on file     Comment: lives with husband, no dietary restrictions   Other Topics Concern  . Not on file   Social History Narrative   Caffeine use:  2-3 cups coffee daily   Regular exercise:  Walks daily          Outpatient Medications Prior to Visit  Medication Sig Dispense Refill  . aspirin EC 81 MG tablet Take 81 mg by mouth daily.      Marland Kitchen atorvastatin (LIPITOR) 10 MG tablet  Take 1 tablet (10 mg total) by mouth daily. 90 tablet 3  . Calcium Carbonate-Vitamin D (CALCIUM-D) 600-400 MG-UNIT TABS Take 1 tablet by mouth daily.     . cetirizine (ZYRTEC) 10 MG tablet Take 10 mg by mouth daily.    Marland Kitchen denosumab (PROLIA) 60 MG/ML SOLN injection Inject 60 mg into the skin every 6 (six) months. Administer in upper arm, thigh, or abdomen    . fluticasone (FLONASE) 50 MCG/ACT nasal spray Place 2 sprays into both nostrils daily. 16 g 1  . Krill Oil 1000 MG CAPS Take 1,000 mg by mouth daily.    Marland Kitchen levothyroxine (SYNTHROID, LEVOTHROID) 125 MCG tablet TAKE 1 TABLET (125 MCG TOTAL) BY MOUTH DAILY. 90 tablet 1  . lisinopril (PRINIVIL,ZESTRIL) 10 MG tablet Take 1 tablet (10 mg total) by mouth daily. 90 tablet 3  . Multiple Vitamin (MULTIVITAMIN) tablet Take 1 tablet by mouth daily.    . Omega-3 Fatty Acids (FISH OIL) 1200 MG CAPS Take 4 capsules by mouth daily.       No facility-administered medications prior to visit.     Allergies  Allergen Reactions  . Iodine Rash    . Mercurochrome [Merbromin] Rash  . Sulfa Antibiotics Rash    Review of Systems  Constitutional: Negative for fever and malaise/fatigue.  HENT: Negative for congestion.   Eyes: Negative for blurred vision.  Respiratory: Negative for shortness of breath.   Cardiovascular: Negative for chest pain, palpitations and leg swelling.  Gastrointestinal: Negative for abdominal pain, blood in stool and nausea.  Genitourinary: Negative for dysuria and frequency.  Musculoskeletal: Negative for falls.       States that a growth in between her toes (L foot).  Skin: Positive for rash.  Neurological: Negative for dizziness, loss of consciousness and headaches.  Endo/Heme/Allergies: Negative for environmental allergies.  Psychiatric/Behavioral: Negative for depression. The patient is not nervous/anxious.        Objective:    Physical Exam  Constitutional: She is oriented to person, place, and time. She appears well-developed and well-nourished. No distress.  HENT:  Head: Normocephalic and atraumatic.  Eyes: Conjunctivae are normal.  Neck: Normal range of motion. No thyromegaly present.  Cardiovascular: Normal rate and regular rhythm.   Pulmonary/Chest: Effort normal and breath sounds normal. She has no wheezes.  Abdominal: Soft. Bowel sounds are normal. There is no tenderness.  Musculoskeletal: She exhibits no edema or deformity.  Neurological: She is alert and oriented to person, place, and time.  Skin: Skin is warm and dry. She is not diaphoretic.  Verrucous raised lesion on lateral aspect of left great toe, roughly 1 cm in diameter, no erythema.   Psychiatric: She has a normal mood and affect.    BP 120/70 (BP Location: Left Arm, Patient Position: Sitting, Cuff Size: Normal)   Pulse 72   Temp 97.9 F (36.6 C) (Oral)   Wt 154 lb 3.2 oz (69.9 kg)   SpO2 95% Comment: RA  BMI 25.66 kg/m  Wt Readings from Last 3 Encounters:  11/03/16 154 lb 3.2 oz (69.9 kg)  09/25/16 156 lb 6.4 oz  (70.9 kg)  03/23/16 152 lb 4 oz (69.1 kg)     Lab Results  Component Value Date   WBC 5.9 09/25/2016   HGB 13.4 09/25/2016   HCT 38.9 09/25/2016   PLT 297.0 09/25/2016   GLUCOSE 98 09/25/2016   CHOL 134 09/25/2016   TRIG 103.0 09/25/2016   HDL 62.20 09/25/2016   LDLCALC 51 09/25/2016   ALT  24 09/25/2016   AST 23 09/25/2016   NA 139 09/25/2016   K 4.0 09/25/2016   CL 103 09/25/2016   CREATININE 0.83 09/25/2016   BUN 12 09/25/2016   CO2 29 09/25/2016   TSH 6.38 (H) 09/25/2016   HGBA1C 5.1 09/25/2016    Lab Results  Component Value Date   TSH 6.38 (H) 09/25/2016   Lab Results  Component Value Date   WBC 5.9 09/25/2016   HGB 13.4 09/25/2016   HCT 38.9 09/25/2016   MCV 92.5 09/25/2016   PLT 297.0 09/25/2016   Lab Results  Component Value Date   NA 139 09/25/2016   K 4.0 09/25/2016   CO2 29 09/25/2016   GLUCOSE 98 09/25/2016   BUN 12 09/25/2016   CREATININE 0.83 09/25/2016   BILITOT 1.0 09/25/2016   ALKPHOS 51 09/25/2016   AST 23 09/25/2016   ALT 24 09/25/2016   PROT 7.5 09/25/2016   ALBUMIN 4.7 09/25/2016   CALCIUM 10.0 09/25/2016   GFR 72.37 09/25/2016   Lab Results  Component Value Date   CHOL 134 09/25/2016   Lab Results  Component Value Date   HDL 62.20 09/25/2016   Lab Results  Component Value Date   LDLCALC 51 09/25/2016   Lab Results  Component Value Date   TRIG 103.0 09/25/2016   Lab Results  Component Value Date   CHOLHDL 2 09/25/2016   Lab Results  Component Value Date   HGBA1C 5.1 09/25/2016       Assessment & Plan:   Problem List Items Addressed This Visit    HTN (hypertension)    Well controlled, no changes to meds. Encouraged heart healthy diet such as the DASH diet and exercise as tolerated.       Hyperglycemia    hgba1c acceptable, minimize simple carbs. Increase exercise as tolerated.      Verrucous skin lesion    Lesion is on left great toe on lateral aspect, irritated at times although not presently. She is  traveling soon. Is encouraged to try salicylic acid bandaids, can wear corn pads around the area to protect it and start Folic Acid 1 mg po daily and if still present when she returns she will let us know so she can be referred to podiatry.          I have changed Ms. Gail's alendronate. I am also having her maintain her Calcium-D, aspirin EC, Fish Oil, cetirizine, fluticasone, atorvastatin, lisinopril, multivitamin, Krill Oil, denosumab, and levothyroxine.  Meds ordered this encounter  Medications  . DISCONTD: alendronate (FOSAMAX) 70 MG tablet    Sig: Take 70 mg by mouth once a week. Take with a full glass of water on an empty stomach.  Marland Kitchen alendronate (FOSAMAX) 70 MG tablet    Sig: Take 1 tablet (70 mg total) by mouth once a week. Take with a full glass of water on an empty stomach.    Dispense:  4 tablet    Refill:  3    CMA served as scribe during this visit. History, Physical and Plan performed by medical provider. Documentation and orders reviewed and attested to.  Penni Homans, MD

## 2016-11-03 NOTE — Progress Notes (Signed)
Pre visit review using our clinic review tool, if applicable. No additional management support is needed unless otherwise documented below in the visit note. 

## 2016-11-04 ENCOUNTER — Encounter: Payer: Self-pay | Admitting: Family Medicine

## 2016-11-04 DIAGNOSIS — B079 Viral wart, unspecified: Secondary | ICD-10-CM

## 2016-11-04 HISTORY — DX: Viral wart, unspecified: B07.9

## 2016-11-04 NOTE — Assessment & Plan Note (Signed)
Lesion is on left great toe on lateral aspect, irritated at times although not presently. She is traveling soon. Is encouraged to try salicylic acid bandaids, can wear corn pads around the area to protect it and start Folic Acid 1 mg po daily and if still present when she returns she will let us know so she can be referred to podiatry.

## 2016-11-04 NOTE — Assessment & Plan Note (Signed)
hgba1c acceptable, minimize simple carbs. Increase exercise as tolerated.  

## 2016-11-04 NOTE — Assessment & Plan Note (Signed)
Well controlled, no changes to meds. Encouraged heart healthy diet such as the DASH diet and exercise as tolerated.  °

## 2016-11-12 NOTE — Telephone Encounter (Signed)
Ashlee, did we hear back from her PA? [employer] was requesting clinical info and her Dexa scan

## 2016-12-08 NOTE — Telephone Encounter (Signed)
No update received.  Insurance verification resubmitted.

## 2016-12-24 NOTE — Telephone Encounter (Signed)
Received a letter from [facility] and will forward to Martinique Johnson

## 2016-12-31 NOTE — Telephone Encounter (Signed)
Re-submitting PA form to [facility] direct from office, will await response

## 2017-01-11 NOTE — Telephone Encounter (Signed)
Received Notice of Approval of Request for Service[s] from [facility] Medication Intake Team Dated: 01/09/17 Prolia Injection has been Approved for the following dates: 01/05/17 to 01/05/2019 Approved EOC Number: [id number] Patient may have to pay a deductible, copayment, or coinsurance- for questions on benefits, call [place] at [phone]/SLS 05/14

## 2017-01-12 NOTE — Telephone Encounter (Signed)
See PA approval info, please contact patient to schedule appt   Gilmore Laroche, will you order Prolia?

## 2017-01-12 NOTE — Telephone Encounter (Signed)
Prolia is in fridge for pt. 

## 2017-01-12 NOTE — Telephone Encounter (Signed)
Scheduled prolia injection 01/29/2017

## 2017-01-29 ENCOUNTER — Other Ambulatory Visit (INDEPENDENT_AMBULATORY_CARE_PROVIDER_SITE_OTHER): Payer: Medicare HMO

## 2017-01-29 ENCOUNTER — Ambulatory Visit (INDEPENDENT_AMBULATORY_CARE_PROVIDER_SITE_OTHER): Payer: Medicare HMO

## 2017-01-29 DIAGNOSIS — M810 Age-related osteoporosis without current pathological fracture: Secondary | ICD-10-CM

## 2017-01-29 DIAGNOSIS — E039 Hypothyroidism, unspecified: Secondary | ICD-10-CM

## 2017-01-29 LAB — TSH: TSH: 6.11 u[IU]/mL — ABNORMAL HIGH (ref 0.35–4.50)

## 2017-01-29 MED ORDER — DENOSUMAB 60 MG/ML ~~LOC~~ SOLN
60.0000 mg | Freq: Once | SUBCUTANEOUS | Status: AC
  Administered 2017-01-29: 60 mg via SUBCUTANEOUS

## 2017-01-29 NOTE — Progress Notes (Addendum)
re visit review using our clinic tool,if applicable. No additional management support is needed unless otherwise documented below in the visit note.   Patient in for Prolia injection. Given 60 mg SQ left arm. Per order from Dr. Charlett Blake dated 01/12/17.  Patient tolerated well.    Nurse note reviewed. Agree with documention and plan.

## 2017-02-01 ENCOUNTER — Other Ambulatory Visit: Payer: Self-pay | Admitting: Family Medicine

## 2017-02-01 MED ORDER — LEVOTHYROXINE SODIUM 150 MCG PO TABS: 150.0000 ug | ORAL_TABLET | Freq: Every day | ORAL | 0 refills | Status: DC

## 2017-04-26 ENCOUNTER — Ambulatory Visit (INDEPENDENT_AMBULATORY_CARE_PROVIDER_SITE_OTHER): Payer: Medicare HMO | Admitting: Family Medicine

## 2017-04-26 ENCOUNTER — Ambulatory Visit (HOSPITAL_BASED_OUTPATIENT_CLINIC_OR_DEPARTMENT_OTHER)
Admission: RE | Admit: 2017-04-26 | Discharge: 2017-04-26 | Disposition: A | Discharge status: Home / Self Care | Payer: Medicare HMO | Source: Ambulatory Visit | Attending: Family Medicine | Admitting: Family Medicine

## 2017-04-26 ENCOUNTER — Encounter: Payer: Self-pay | Admitting: Family Medicine

## 2017-04-26 VITALS — BP 120/74 | HR 60 | Temp 98.1°F | Resp 14 | Ht 65.0 in | Wt 156.0 lb

## 2017-04-26 DIAGNOSIS — I10 Essential (primary) hypertension: Secondary | ICD-10-CM

## 2017-04-26 DIAGNOSIS — R739 Hyperglycemia, unspecified: Secondary | ICD-10-CM | POA: Diagnosis not present

## 2017-04-26 DIAGNOSIS — M25462 Effusion, left knee: Secondary | ICD-10-CM | POA: Diagnosis not present

## 2017-04-26 DIAGNOSIS — M25562 Pain in left knee: Secondary | ICD-10-CM | POA: Insufficient documentation

## 2017-04-26 DIAGNOSIS — E559 Vitamin D deficiency, unspecified: Secondary | ICD-10-CM

## 2017-04-26 DIAGNOSIS — E782 Mixed hyperlipidemia: Secondary | ICD-10-CM

## 2017-04-26 DIAGNOSIS — M810 Age-related osteoporosis without current pathological fracture: Secondary | ICD-10-CM

## 2017-04-26 DIAGNOSIS — E039 Hypothyroidism, unspecified: Secondary | ICD-10-CM

## 2017-04-26 LAB — CBC
HCT: 39.2 % (ref 36.0–46.0)
Hemoglobin: 13.4 g/dL (ref 12.0–15.0)
MCHC: 34.2 g/dL (ref 30.0–36.0)
MCV: 95.4 fl (ref 78.0–100.0)
Platelets: 390 10*3/uL (ref 150.0–400.0)
RBC: 4.11 Mil/uL (ref 3.87–5.11)
RDW: 13 % (ref 11.5–15.5)
WBC: 6.8 10*3/uL (ref 4.0–10.5)

## 2017-04-26 LAB — TSH: TSH: 5.17 u[IU]/mL — ABNORMAL HIGH (ref 0.35–4.50)

## 2017-04-26 LAB — LIPID PANEL
CHOL/HDL RATIO: 2
Cholesterol: 154 mg/dL (ref 0–200)
HDL: 63.4 mg/dL (ref >39.00)
LDL CALC: 59 mg/dL (ref 0–99)
NONHDL: 90.13
Triglycerides: 156 mg/dL — ABNORMAL HIGH (ref 0.0–149.0)
VLDL: 31.2 mg/dL (ref 0.0–40.0)

## 2017-04-26 LAB — VITAMIN D 25 HYDROXY (VIT D DEFICIENCY, FRACTURES): VITD: 30.5 ng/mL (ref 30.00–100.00)

## 2017-04-26 LAB — COMPREHENSIVE METABOLIC PANEL
ALT: 66 U/L — AB (ref 0–35)
AST: 41 U/L — ABNORMAL HIGH (ref 0–37)
Albumin: 4.7 g/dL (ref 3.5–5.2)
Alkaline Phosphatase: 77 U/L (ref 39–117)
BILIRUBIN TOTAL: 0.5 mg/dL (ref 0.2–1.2)
BUN: 14 mg/dL (ref 6–23)
CO2: 29 meq/L (ref 19–32)
CREATININE: 0.83 mg/dL (ref 0.40–1.20)
Calcium: 10.5 mg/dL (ref 8.4–10.5)
Chloride: 103 mEq/L (ref 96–112)
GFR: 72.25 mL/min (ref >60.00)
GLUCOSE: 102 mg/dL — AB (ref 70–99)
Potassium: 4.8 mEq/L (ref 3.5–5.1)
Sodium: 137 mEq/L (ref 135–145)
TOTAL PROTEIN: 7.5 g/dL (ref 6.0–8.3)

## 2017-04-26 LAB — HEMOGLOBIN A1C: HEMOGLOBIN A1C: 5 % (ref 4.6–6.5)

## 2017-04-26 NOTE — Assessment & Plan Note (Signed)
Well controlled, no changes to meds. Encouraged heart healthy diet such as the DASH diet and exercise as tolerated.  °

## 2017-04-26 NOTE — Assessment & Plan Note (Signed)
Injury 2 weeks ago while playing in the surf. Twisted her knee. Xray shows an effusion. Pain is improving slowly will refer to sports med for further management and she will continue icing and topical Lidocaine prn

## 2017-04-26 NOTE — Assessment & Plan Note (Signed)
Check level today 

## 2017-04-26 NOTE — Patient Instructions (Addendum)
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 Both are treated similarly. These are the different medications you can take for this: Tylenol 500mg  1-2 tabs three times a day for pain. Aleve 1-2 tabs twice a day with food Capsaicin, aspercreme, or biofreeze topically up to four times a day may also help with pain. Some supplements that may help for arthritis: Boswellia extract, curcumin, pycnogenol Cortisone injections are an option. It's important that you continue to stay active. Straight leg raises, knee extensions 3 sets of 10 once a day (add ankle weight if these become too easy). Consider physical therapy to strengthen muscles around the joint that hurts to take pressure off of the joint itself. Shoe inserts with good arch support may be helpful. Ice 15 minutes at a time 3-4 times a day as needed to help with pain and swelling. Follow up with me in 1 month.

## 2017-04-26 NOTE — Assessment & Plan Note (Signed)
minimize simple carbs. Increase exercise as tolerated.  

## 2017-04-26 NOTE — Assessment & Plan Note (Signed)
Encouraged to get adequate exercise, calcium and vitamin d intake. Tolerating Prolia injections

## 2017-04-26 NOTE — Patient Instructions (Addendum)
Recommend calcium intake of 1200 to 1500 mg daily, divided into roughly 3 doses. Best source is the diet and a single dairy serving is about 500 mg, a supplement of calcium citrate once or twice daily to balance diet is fine if not getting enough in diet. Also need Vitamin D 2000 IU caps, 1 cap daily if not already taking vitamin D. Also recommend weight baring exercise on hips and upper body to keep bones strong  Tylenol/Acetaminophen ES 500 mg tabs, max of 3000 mg in 24 hours  Topical Lidocaine get to knee, [employer], [place] and Aspercreme to knee Knee Pain, Adult Many things can cause knee pain. The pain often goes away on its own with time and rest. If the pain does not go away, tests may be done to find out what is causing the pain. Follow these instructions at home: Activity  Rest your knee.  Do not do things that cause pain.  Avoid activities where both feet leave the ground at the same time (high-impact activities). Examples are running, jumping rope, and doing jumping jacks. General instructions  Take medicines only as told by your doctor.  Raise (elevate) your knee when you are resting. Make sure your knee is higher than your heart.  Sleep with a pillow under your knee.  If told, put ice on the knee: ? Put ice in a plastic bag. ? Place a towel between your skin and the bag. ? Leave the ice on for 20 minutes, 2-3 times a day.  Ask your doctor if you should wear an elastic knee support.  Lose weight if you are overweight. Being overweight can make your knee hurt more.  Do not use any tobacco products. These include cigarettes, chewing tobacco, or electronic cigarettes. If you need help quitting, ask your doctor. Smoking may slow down healing. Contact a doctor if:  The pain does not stop.  The pain changes or gets worse.  You have a fever along with knee pain.  Your knee gives out or locks up.  Your knee swells, and becomes worse. Get help right away if:  Your  knee feels warm.  You cannot move your knee.  You have very bad knee pain.  You have chest pain.  You have trouble breathing. Summary  Many things can cause knee pain. The pain often goes away on its own with time and rest.  Avoid activities that put stress on your knee. These include running and jumping rope.  Get help right away if you cannot move your knee, or if your knee feels warm, or if you have trouble breathing. This information is not intended to replace advice given to you by your health care provider. Make sure you discuss any questions you have with your health care provider. Document Released: 11/13/2008 Document Revised: 08/11/2016 Document Reviewed: 08/11/2016 Elsevier Interactive Patient Education  2017 [employer].

## 2017-04-26 NOTE — Assessment & Plan Note (Signed)
On Levothyroxine, continue to monitor 

## 2017-04-26 NOTE — Progress Notes (Signed)
Pre visit review using our clinic review tool, if applicable. No additional management support is needed unless otherwise documented below in the visit note. 

## 2017-04-26 NOTE — Progress Notes (Signed)
Patient ID: Michelle Jacobs, female   DOB: July 20, 1946, 71 y.o.   MRN: [id number]   Subjective:    Patient ID: Michelle Jacobs, female    DOB: 06-21-1946, 71 y.o.   MRN: [id number]  Chief Complaint  Patient presents with  . Follow-up    7 mnth    HPI Patient is in today for Follow-up. Overall she's been doing well but she does note about 2 weeks ago she was [place] and she twisted her left knee. She's had pain ever since although it is slowly improving. She has been icing and elevating numerous times each day and that is helpful while she is doing it. She has been taking Tylenol numerous times each day and gets mild relief from that as well. She has notable swelling but denies any ecchymosis, warmth or redness. Swelling does not seem to be resolving. No radicular symptoms. No recent febrile illness or acute hospitalization. Denies CP/palp/SOB/HA/congestion/fevers/GI or GU c/o. Taking meds as prescribed  Past Medical History:  Diagnosis Date  . Allergy   . Annual physical exam 10/06/2015  . GERD (gastroesophageal reflux disease)    mild  . History of chicken pox   . Hyperglycemia 05/20/2013  . Hyperlipidemia   . Hypertension   . Medicare annual wellness visit, subsequent 12/02/2011  . Personal history of colonic polyps 2009   Benign colon polyps  . Preventative health care 12/02/2011  . Verrucous skin lesion 11/04/2016    No past surgical history on file.  Family History  Problem Relation Age of Onset  . Cancer Mother        lung  . Hypertension Mother   . Heart disease Father        CHF  . Diabetes Maternal Uncle        type II  . Heart disease Maternal Uncle        s/p CABG  . Cancer Maternal Grandmother 42       breast  . Heart disease Maternal Grandmother        MI, CHF  . Obesity Maternal Grandmother   . Cancer Sister        uterine  . Hypertension Sister   . Hyperlipidemia Brother   . Hypertension Brother   . Cancer Brother        HPV  associated oral cancer  . Hearing loss Brother   . Kidney disease Maternal Grandfather        kidney failure  . Cancer Paternal Grandmother   . COPD Paternal Grandfather   . Diabetes Maternal Aunt        older, type 2    Social History   Social History  . Marital status: Married    Spouse name: N/A  . Number of children: 2  . Years of education: N/A   Occupational History  .  [employer] History Main Topics  . Smoking status: Never Smoker  . Smokeless tobacco: Never Used  . Alcohol use 6.0 oz/week    10 Glasses of wine per week  . Drug use: No  . Sexual activity: Not on file     Comment: lives with husband, no dietary restrictions   Other Topics Concern  . Not on file   Social History Narrative   Caffeine use:  2-3 cups coffee daily   Regular exercise:  Walks daily          Outpatient Medications Prior to Visit  Medication Sig  Dispense Refill  . aspirin EC 81 MG tablet Take 81 mg by mouth daily.      Marland Kitchen atorvastatin (LIPITOR) 10 MG tablet Take 1 tablet (10 mg total) by mouth daily. 90 tablet 3  . Calcium Carbonate-Vitamin D (CALCIUM-D) 600-400 MG-UNIT TABS Take 1 tablet by mouth daily.     . cetirizine (ZYRTEC) 10 MG tablet Take 10 mg by mouth daily.    Marland Kitchen denosumab (PROLIA) 60 MG/ML SOLN injection Inject 60 mg into the skin every 6 (six) months. Administer in upper arm, thigh, or abdomen    . fluticasone (FLONASE) 50 MCG/ACT nasal spray Place 2 sprays into both nostrils daily. 16 g 1  . Krill Oil 1000 MG CAPS Take 1,000 mg by mouth daily.    Marland Kitchen levothyroxine (SYNTHROID, LEVOTHROID) 150 MCG tablet Take 1 tablet (150 mcg total) by mouth daily before breakfast. 90 tablet 0  . lisinopril (PRINIVIL,ZESTRIL) 10 MG tablet Take 1 tablet (10 mg total) by mouth daily. 90 tablet 3  . Multiple Vitamin (MULTIVITAMIN) tablet Take 1 tablet by mouth daily.    . Omega-3 Fatty Acids (FISH OIL) 1200 MG CAPS Take 4 capsules by mouth daily.      Marland Kitchen alendronate  (FOSAMAX) 70 MG tablet Take 1 tablet (70 mg total) by mouth once a week. Take with a full glass of water on an empty stomach. (Patient not taking: Reported on 04/26/2017) 4 tablet 3   No facility-administered medications prior to visit.     Allergies  Allergen Reactions  . Iodine Rash  . Mercurochrome [Merbromin] Rash  . Sulfa Antibiotics Rash    Review of Systems  Constitutional: Negative for fever and malaise/fatigue.  HENT: Negative for congestion.   Eyes: Negative for blurred vision.  Respiratory: Negative for shortness of breath.   Cardiovascular: Negative for chest pain, palpitations and leg swelling.  Gastrointestinal: Negative for abdominal pain, blood in stool and nausea.  Genitourinary: Negative for dysuria and frequency.  Musculoskeletal: Positive for joint pain. Negative for falls.  Skin: Negative for rash.  Neurological: Negative for dizziness, loss of consciousness and headaches.  Endo/Heme/Allergies: Negative for environmental allergies.  Psychiatric/Behavioral: Negative for depression. The patient is not nervous/anxious.        Objective:    Physical Exam  Constitutional: She is oriented to person, place, and time. She appears well-developed and well-nourished. No distress.  HENT:  Head: Normocephalic and atraumatic.  Nose: Nose normal.  Eyes: Right eye exhibits no discharge. Left eye exhibits no discharge.  Neck: Normal range of motion. Neck supple.  Cardiovascular: Normal rate and regular rhythm.   No murmur heard. Pulmonary/Chest: Effort normal and breath sounds normal.  Abdominal: Soft. Bowel sounds are normal. There is no tenderness.  Musculoskeletal: She exhibits edema and tenderness.  Swelling anteriorly left knee. Tender to touch over lateral aspect. No joint laxity or pain with manipulation.   Neurological: She is alert and oriented to person, place, and time.  Skin: Skin is warm and dry.  Psychiatric: She has a normal mood and affect.  Nursing  note and vitals reviewed.   BP 120/74 (BP Location: Left Arm, Patient Position: Sitting, Cuff Size: Small)   Pulse 60   Temp 98.1 F (36.7 C) (Oral)   Resp 14   Ht 5\' 5"  (1.651 m)   Wt 156 lb (70.8 kg)   SpO2 97%   BMI 25.96 kg/m  Wt Readings from Last 3 Encounters:  04/26/17 156 lb (70.8 kg)  11/03/16 154 lb 3.2 oz (  69.9 kg)  09/25/16 156 lb 6.4 oz (70.9 kg)     Lab Results  Component Value Date   WBC 5.9 09/25/2016   HGB 13.4 09/25/2016   HCT 38.9 09/25/2016   PLT 297.0 09/25/2016   GLUCOSE 98 09/25/2016   CHOL 134 09/25/2016   TRIG 103.0 09/25/2016   HDL 62.20 09/25/2016   LDLCALC 51 09/25/2016   ALT 24 09/25/2016   AST 23 09/25/2016   NA 139 09/25/2016   K 4.0 09/25/2016   CL 103 09/25/2016   CREATININE 0.83 09/25/2016   BUN 12 09/25/2016   CO2 29 09/25/2016   TSH 6.11 (H) 01/29/2017   HGBA1C 5.1 09/25/2016    Lab Results  Component Value Date   TSH 6.11 (H) 01/29/2017   Lab Results  Component Value Date   WBC 5.9 09/25/2016   HGB 13.4 09/25/2016   HCT 38.9 09/25/2016   MCV 92.5 09/25/2016   PLT 297.0 09/25/2016   Lab Results  Component Value Date   NA 139 09/25/2016   K 4.0 09/25/2016   CO2 29 09/25/2016   GLUCOSE 98 09/25/2016   BUN 12 09/25/2016   CREATININE 0.83 09/25/2016   BILITOT 1.0 09/25/2016   ALKPHOS 51 09/25/2016   AST 23 09/25/2016   ALT 24 09/25/2016   PROT 7.5 09/25/2016   ALBUMIN 4.7 09/25/2016   CALCIUM 10.0 09/25/2016   GFR 72.37 09/25/2016   Lab Results  Component Value Date   CHOL 134 09/25/2016   Lab Results  Component Value Date   HDL 62.20 09/25/2016   Lab Results  Component Value Date   LDLCALC 51 09/25/2016   Lab Results  Component Value Date   TRIG 103.0 09/25/2016   Lab Results  Component Value Date   CHOLHDL 2 09/25/2016   Lab Results  Component Value Date   HGBA1C 5.1 09/25/2016       Assessment & Plan:   Problem List Items Addressed This Visit    Osteoporosis (Chronic)    Encouraged  to get adequate exercise, calcium and vitamin d intake. Tolerating Prolia injections      HTN (hypertension)    Well controlled, no changes to meds. Encouraged heart healthy diet such as the DASH diet and exercise as tolerated.       Relevant Orders   CBC   Comprehensive metabolic panel   TSH   Hyperlipidemia    Encouraged heart healthy diet, increase exercise, avoid trans fats, consider a krill oil cap daily      Relevant Orders   Lipid panel   Hypothyroidism    On Levothyroxine, continue to monitor      Hyperglycemia - Primary    minimize simple carbs. Increase exercise as tolerated.       Relevant Orders   Hemoglobin A1c   Vitamin D deficiency    Check level today      Relevant Orders   VITAMIN D 25 Hydroxy (Vit-D Deficiency, Fractures)   Left knee pain    Injury 2 weeks ago while playing in the surf. Twisted her knee. Xray shows an effusion. Pain is improving slowly will refer to sports med for further management and she will continue icing and topical Lidocaine prn      Relevant Orders   DG Knee Complete 4 Views Left (Completed)   Ambulatory referral to Sports Medicine      I have discontinued Ms. Hata's alendronate. I am also having her maintain her Calcium-D, aspirin EC, Fish Oil, cetirizine, fluticasone,  atorvastatin, lisinopril, multivitamin, Krill Oil, denosumab, and levothyroxine.  No orders of the defined types were placed in this encounter.    Penni Homans, MD

## 2017-04-26 NOTE — Assessment & Plan Note (Signed)
Encouraged heart healthy diet, increase exercise, avoid trans fats, consider a krill oil cap daily 

## 2017-04-27 NOTE — Assessment & Plan Note (Signed)
consistent with lateral meniscus tear vs flare of minimal arthritis.  Independently reviewed radiographs and no evidence fracture.  Encouraged conservative treatment - declined injection, physical therapy for now.  Tylenol, aleve, topical medications, supplements reviewed.  Shown home exercises.  Icing.  F/u in 1 month.  Consider injection, PT, MRI if not improving.

## 2017-04-27 NOTE — Progress Notes (Signed)
Patient notified of results, she voiced her understanding.     PC
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
## 2017-05-18 ENCOUNTER — Other Ambulatory Visit: Payer: Self-pay | Admitting: Family Medicine

## 2017-05-25 ENCOUNTER — Ambulatory Visit (INDEPENDENT_AMBULATORY_CARE_PROVIDER_SITE_OTHER): Payer: Medicare HMO | Admitting: Behavioral Health

## 2017-05-25 DIAGNOSIS — Z23 Encounter for immunization: Secondary | ICD-10-CM | POA: Diagnosis not present

## 2017-05-25 MED ORDER — ATORVASTATIN CALCIUM 10 MG PO TABS: 10.0000 mg | ORAL_TABLET | Freq: Every day | ORAL | 3 refills | Status: DC

## 2017-05-25 MED ORDER — LISINOPRIL 10 MG PO TABS: 10.0000 mg | ORAL_TABLET | Freq: Every day | ORAL | 3 refills | Status: DC

## 2017-05-25 NOTE — Progress Notes (Signed)
Pre visit review using our clinic review tool, if applicable. No additional management support is needed unless otherwise documented below in the visit note.  Patient came in clinic for influenza vaccination. IM injection was given in the left deltoid. Patient tolerated the injection well. No s/s of a reaction prior to patient leaving the nurse visit.

## 2017-05-27 ENCOUNTER — Ambulatory Visit: Payer: Medicare HMO | Admitting: Family Medicine

## 2017-07-21 ENCOUNTER — Telehealth: Payer: Self-pay | Admitting: Family Medicine

## 2017-07-21 ENCOUNTER — Encounter: Payer: Self-pay | Admitting: Family Medicine

## 2017-07-21 NOTE — Telephone Encounter (Signed)
Prolia benefits verified for patient  PA is required and on fileJosem Jacobs [id number] valid from 01/05/17-01/05/2019  $10 copay for admin 20% co-insurance for Prolia  Patient may owe approximately $ 220 OOP  Letter mailed informing patient-Due after 07/28/17

## 2017-07-27 ENCOUNTER — Telehealth: Payer: Self-pay

## 2017-07-27 NOTE — Telephone Encounter (Signed)
Copied from [place] [id number]. Topic: General - Other >> Jul 27, 2017 10:24 AM Synthia Innocent wrote: CRM for notification. See Telephone encounter for:  Calling regarding prolia injection, had once in 01/2017, when is she do again? Does she need another insurance approval? 07/27/17.

## 2017-07-27 NOTE — Telephone Encounter (Signed)
Please order Prolia for Pt. Thank you.

## 2017-07-27 NOTE — Telephone Encounter (Signed)
Laren Everts      4:21 PM  Note    Prolia benefits verified for patient  PA is required and on fileJosem Kaufmann [id number] valid from 01/05/17-01/05/2019  $10 copay for admin 20% co-insurance for Prolia  Patient may owe approximately $ 220 OOP  Letter mailed informing patient-Due after 07/28/17

## 2017-07-27 NOTE — Telephone Encounter (Signed)
Prolia has been ordered

## 2017-07-30 ENCOUNTER — Telehealth: Payer: Self-pay | Admitting: Family Medicine

## 2017-07-30 NOTE — Telephone Encounter (Signed)
Copied from [place] [phone]. Topic: Quick Communication - See Telephone Encounter >> Jul 30, 2017 10:31 AM Ether Griffins B wrote: CRM for notification. See Telephone encounter for:  Pt would like fosamax called in due to prolia inj would cost 292 every six months. May call work line between 10-5 07/30/17.

## 2017-07-30 NOTE — Telephone Encounter (Signed)
Patient wants fosamax instead of Prolia    Please advise

## 2017-07-30 NOTE — Telephone Encounter (Signed)
OK there is just evidence that after5 years of use it does not help as much as it used to. Can restart it with 6 mn of refills and by next year her insurance may cover better.

## 2017-07-30 NOTE — Telephone Encounter (Signed)
Prolia has arrived.

## 2017-08-03 MED ORDER — ALENDRONATE SODIUM 70 MG PO TABS: 70.0000 mg | ORAL_TABLET | ORAL | 0 refills | Status: DC

## 2017-08-03 NOTE — Addendum Note (Signed)
Addended by: [name] A on: 08/03/2017 10:51 AM   Modules accepted: Orders

## 2017-08-03 NOTE — Telephone Encounter (Signed)
Patient notified rx sent in.

## 2017-08-16 NOTE — Telephone Encounter (Signed)
Called patient to add her to the Nurse schedule for her Prolia injection

## 2017-10-25 ENCOUNTER — Ambulatory Visit (INDEPENDENT_AMBULATORY_CARE_PROVIDER_SITE_OTHER): Payer: Medicare HMO | Admitting: Family Medicine

## 2017-10-25 ENCOUNTER — Encounter: Payer: Self-pay | Admitting: Family Medicine

## 2017-10-25 VITALS — BP 118/62 | HR 58 | Temp 97.9°F | Resp 18 | Ht 65.0 in | Wt 157.8 lb

## 2017-10-25 DIAGNOSIS — E782 Mixed hyperlipidemia: Secondary | ICD-10-CM | POA: Diagnosis not present

## 2017-10-25 DIAGNOSIS — I10 Essential (primary) hypertension: Secondary | ICD-10-CM | POA: Diagnosis not present

## 2017-10-25 DIAGNOSIS — E559 Vitamin D deficiency, unspecified: Secondary | ICD-10-CM | POA: Diagnosis not present

## 2017-10-25 DIAGNOSIS — Z0000 Encounter for general adult medical examination without abnormal findings: Secondary | ICD-10-CM | POA: Diagnosis not present

## 2017-10-25 DIAGNOSIS — M810 Age-related osteoporosis without current pathological fracture: Secondary | ICD-10-CM

## 2017-10-25 DIAGNOSIS — R350 Frequency of micturition: Secondary | ICD-10-CM | POA: Insufficient documentation

## 2017-10-25 DIAGNOSIS — R739 Hyperglycemia, unspecified: Secondary | ICD-10-CM

## 2017-10-25 DIAGNOSIS — E039 Hypothyroidism, unspecified: Secondary | ICD-10-CM

## 2017-10-25 LAB — COMPREHENSIVE METABOLIC PANEL
ALT: 49 U/L — ABNORMAL HIGH (ref 0–35)
AST: 25 U/L (ref 0–37)
Albumin: 4.3 g/dL (ref 3.5–5.2)
Alkaline Phosphatase: 67 U/L (ref 39–117)
BUN: 14 mg/dL (ref 6–23)
CHLORIDE: 104 meq/L (ref 96–112)
CO2: 29 mEq/L (ref 19–32)
Calcium: 10.2 mg/dL (ref 8.4–10.5)
Creatinine, Ser: 0.8 mg/dL (ref 0.40–1.20)
GFR: 75.27 mL/min (ref >60.00)
GLUCOSE: 95 mg/dL (ref 70–99)
POTASSIUM: 4.6 meq/L (ref 3.5–5.1)
SODIUM: 139 meq/L (ref 135–145)
Total Bilirubin: 0.7 mg/dL (ref 0.2–1.2)
Total Protein: 7.2 g/dL (ref 6.0–8.3)

## 2017-10-25 LAB — LIPID PANEL
CHOLESTEROL: 129 mg/dL (ref 0–200)
HDL: 54.7 mg/dL (ref >39.00)
LDL CALC: 52 mg/dL (ref 0–99)
NONHDL: 74.31
Total CHOL/HDL Ratio: 2
Triglycerides: 111 mg/dL (ref 0.0–149.0)
VLDL: 22.2 mg/dL (ref 0.0–40.0)

## 2017-10-25 LAB — CBC
HEMATOCRIT: 38.4 % (ref 36.0–46.0)
Hemoglobin: 13.4 g/dL (ref 12.0–15.0)
MCHC: 34.7 g/dL (ref 30.0–36.0)
MCV: 92.3 fl (ref 78.0–100.0)
Platelets: 340 10*3/uL (ref 150.0–400.0)
RBC: 4.16 Mil/uL (ref 3.87–5.11)
RDW: 13 % (ref 11.5–15.5)
WBC: 5.1 10*3/uL (ref 4.0–10.5)

## 2017-10-25 LAB — HEMOGLOBIN A1C: Hgb A1c MFr Bld: 5.1 % (ref 4.6–6.5)

## 2017-10-25 LAB — VITAMIN D 25 HYDROXY (VIT D DEFICIENCY, FRACTURES): VITD: 28.1 ng/mL — ABNORMAL LOW (ref 30.00–100.00)

## 2017-10-25 LAB — TSH: TSH: 0.18 u[IU]/mL — AB (ref 0.35–4.50)

## 2017-10-25 MED ORDER — LEVOTHYROXINE SODIUM 150 MCG PO TABS: 150.0000 ug | ORAL_TABLET | Freq: Every day | ORAL | 1 refills | Status: DC

## 2017-10-25 MED ORDER — ALENDRONATE SODIUM 70 MG PO TABS: 70.0000 mg | ORAL_TABLET | ORAL | 0 refills | Status: DC

## 2017-10-25 MED ORDER — ATORVASTATIN CALCIUM 10 MG PO TABS: 10.0000 mg | ORAL_TABLET | Freq: Every day | ORAL | 3 refills | Status: DC

## 2017-10-25 MED ORDER — LISINOPRIL 10 MG PO TABS: 10.0000 mg | ORAL_TABLET | Freq: Every day | ORAL | 3 refills | Status: DC

## 2017-10-25 NOTE — Assessment & Plan Note (Signed)
Well controlled, no changes to meds. Encouraged heart healthy diet such as the DASH diet and exercise as tolerated.  °

## 2017-10-25 NOTE — Patient Instructions (Addendum)
Curcumen helps aches and pains Lidocaine gel by icy hot, salon pas and aspercreme  Shingrix is the new shingles shot, 2 shots over 2-6 months  Preventive Care 65 Years and Older, Female Preventive care refers to lifestyle choices and visits with your health care provider that can promote health and wellness. What does preventive care include?  A yearly physical exam. This is also called an annual well check.  Dental exams once or twice a year.  Routine eye exams. Ask your health care provider how often you should have your eyes checked.  Personal lifestyle choices, including: ? Daily care of your teeth and gums. ? Regular physical activity. ? Eating a healthy diet. ? Avoiding tobacco and drug use. ? Limiting alcohol use. ? Practicing safe sex. ? Taking low-dose aspirin every day. ? Taking vitamin and mineral supplements as recommended by your health care provider. What happens during an annual well check? The services and screenings done by your health care provider during your annual well check will depend on your age, overall health, lifestyle risk factors, and family history of disease. Counseling Your health care provider may ask you questions about your:  Alcohol use.  Tobacco use.  Drug use.  Emotional well-being.  Home and relationship well-being.  Sexual activity.  Eating habits.  History of falls.  Memory and ability to understand (cognition).  Work and work Statistician.  Reproductive health.  Screening You may have the following tests or measurements:  Height, weight, and BMI.  Blood pressure.  Lipid and cholesterol levels. These may be checked every 5 years, or more frequently if you are over 93 years old.  Skin check.  Lung cancer screening. You may have this screening every year starting at age 23 if you have a 30-pack-year history of smoking and currently smoke or have quit within the past 15 years.  Fecal occult blood test (FOBT) of the  stool. You may have this test every year starting at age 10.  Flexible sigmoidoscopy or colonoscopy. You may have a sigmoidoscopy every 5 years or a colonoscopy every 10 years starting at age 97.  Hepatitis C blood test.  Hepatitis B blood test.  Sexually transmitted disease (STD) testing.  Diabetes screening. This is done by checking your blood sugar (glucose) after you have not eaten for a while (fasting). You may have this done every 1-3 years.  Bone density scan. This is done to screen for osteoporosis. You may have this done starting at age 42.  Mammogram. This may be done every 1-2 years. Talk to your health care provider about how often you should have regular mammograms.  Talk with your health care provider about your test results, treatment options, and if necessary, the need for more tests. Vaccines Your health care provider may recommend certain vaccines, such as:  Influenza vaccine. This is recommended every year.  Tetanus, diphtheria, and acellular pertussis (Tdap, Td) vaccine. You may need a Td booster every 10 years.  Varicella vaccine. You may need this if you have not been vaccinated.  Zoster vaccine. You may need this after age 81.  Measles, mumps, and rubella (MMR) vaccine. You may need at least one dose of MMR if you were born in 1957 or later. You may also need a second dose.  Pneumococcal 13-valent conjugate (PCV13) vaccine. One dose is recommended after age 48.  Pneumococcal polysaccharide (PPSV23) vaccine. One dose is recommended after age 72.  Meningococcal vaccine. You may need this if you have certain conditions.  Hepatitis A vaccine. You may need this if you have certain conditions or if you travel or work in places where you may be exposed to hepatitis A.  Hepatitis B vaccine. You may need this if you have certain conditions or if you travel or work in places where you may be exposed to hepatitis B.  Haemophilus influenzae type b (Hib) vaccine. You  may need this if you have certain conditions.  Talk to your health care provider about which screenings and vaccines you need and how often you need them. This information is not intended to replace advice given to you by your health care provider. Make sure you discuss any questions you have with your health care provider. Document Released: 09/13/2015 Document Revised: 05/06/2016 Document Reviewed: 06/18/2015 Elsevier Interactive Patient Education  2018 [place] calcium intake of 1200 to 1500 mg daily, divided into roughly 3 doses. Best source is the diet and a single dairy serving is about 500 mg, a supplement of calcium citrate once or twice daily to balance diet is fine if not getting enough in diet. Also need Vitamin D 2000 IU caps, 1 cap daily if not already taking vitamin D. Also recommend weight baring exercise on hips and upper body to keep bones strong

## 2017-10-25 NOTE — Assessment & Plan Note (Signed)
Check UA and culture 

## 2017-10-25 NOTE — Assessment & Plan Note (Signed)
hgba1c acceptable, minimize simple carbs. Increase exercise as tolerated.  

## 2017-10-25 NOTE — Assessment & Plan Note (Signed)
Taking 5000 IU twice weekly will check level

## 2017-10-25 NOTE — Assessment & Plan Note (Addendum)
Started taking bisphosphanates well over 20 years ago. Tolerates Fosamax will continue to take for 6 more months, recheck Dexa and consider switch to Prolia

## 2017-10-25 NOTE — Assessment & Plan Note (Signed)
Tolerating statin, encouraged heart healthy diet, avoid trans fats, minimize simple carbs and saturated fats. Increase exercise as tolerated 

## 2017-10-25 NOTE — Assessment & Plan Note (Signed)
Patient encouraged to maintain heart healthy diet, regular exercise, adequate sleep. Consider daily probiotics. Take medications as prescribed. Labs ordered today

## 2017-10-25 NOTE — Progress Notes (Signed)
Subjective:  I acted as a Education administrator for Dr. Charlett Blake. [place], [place]  Patient ID: Michelle Jacobs, female    DOB: March 25, 1946, 72 y.o.   MRN: [id number]  No chief complaint on file.   HPI  Patient is in today for an annual exam and follow up on chronic medical concerns including hypertension, hyperlipidemia, Vitamin D deficiency and hyperglycemia, she denies any polyuria or polydipsia. She is doing well with activities of daily living. She has not been exercising regularly but does exercise some days. Tries to maintain a heart healthy diet. Denies CP/palp/SOB/HA/congestion/fevers/GI or GU c/o. Taking meds as prescribed  Patient Care Team: Mosie Lukes, MD as PCP - General (Family Medicine)   Past Medical History:  Diagnosis Date  . Allergy   . Annual physical exam 10/06/2015  . GERD (gastroesophageal reflux disease)    mild  . History of chicken pox   . Hyperglycemia 05/20/2013  . Hyperlipidemia   . Hypertension   . Medicare annual wellness visit, subsequent 12/02/2011  . Personal history of colonic polyps 2009   Benign colon polyps  . Preventative health care 12/02/2011  . Verrucous skin lesion 11/04/2016    No past surgical history on file.  Family History  Problem Relation Age of Onset  . Cancer Mother        lung  . Hypertension Mother   . Heart disease Father        CHF  . Diabetes Maternal Uncle        type II  . Heart disease Maternal Uncle        s/p CABG  . Cancer Maternal Grandmother 37       breast  . Heart disease Maternal Grandmother        MI, CHF  . Obesity Maternal Grandmother   . Cancer Sister        uterine  . Hypertension Sister   . Hyperlipidemia Brother   . Hypertension Brother   . Cancer Brother        HPV associated oral cancer  . Hearing loss Brother   . Kidney disease Maternal Grandfather        kidney failure  . Cancer Paternal Grandmother   . COPD Paternal Grandfather   . Diabetes Maternal Aunt        older, type 2    Social History     Socioeconomic History  . Marital status: Married    Spouse name: Not on file  . Number of children: 2  . Years of education: Not on file  . Highest education level: Not on file  Social Needs  . Financial resource strain: Not on file  . Food insecurity - worry: Not on file  . Food insecurity - inability: Not on file  . Transportation needs - medical: Not on file  . Transportation needs - non-medical: Not on file  Occupational History    Employer: [employer]  Tobacco Use  . Smoking status: Never Smoker  . Smokeless tobacco: Never Used  Substance and Sexual Activity  . Alcohol use: Yes    Alcohol/week: 6.0 oz    Types: 10 Glasses of wine per week  . Drug use: No  . Sexual activity: Not on file    Comment: lives with husband, no dietary restrictions  Other Topics Concern  . Not on file  Social History Narrative   Caffeine use:  2-3 cups coffee daily   Regular exercise:  Walks daily  Outpatient Medications Prior to Visit  Medication Sig Dispense Refill  . aspirin EC 81 MG tablet Take 81 mg by mouth daily.      . Calcium Carbonate-Vitamin D (CALCIUM-D) 600-400 MG-UNIT TABS Take 1 tablet by mouth daily.     . cetirizine (ZYRTEC) 10 MG tablet Take 10 mg by mouth daily.    . fluticasone (FLONASE) 50 MCG/ACT nasal spray Place 2 sprays into both nostrils daily. 16 g 1  . Krill Oil 1000 MG CAPS Take 1,000 mg by mouth daily.    . Multiple Vitamin (MULTIVITAMIN) tablet Take 1 tablet by mouth daily.    Marland Kitchen alendronate (FOSAMAX) 70 MG tablet Take 1 tablet (70 mg total) by mouth every 7 (seven) days. Take with a full glass of water on an empty stomach. 24 tablet 0  . atorvastatin (LIPITOR) 10 MG tablet Take 1 tablet (10 mg total) by mouth daily. 90 tablet 3  . denosumab (PROLIA) 60 MG/ML SOLN injection Inject 60 mg into the skin every 6 (six) months. Administer in upper arm, thigh, or abdomen    . levothyroxine (SYNTHROID, LEVOTHROID) 150 MCG tablet TAKE 1 TABLET  (150 MCG TOTAL) BY MOUTH DAILY BEFORE BREAKFAST. 90 tablet 1  . lisinopril (PRINIVIL,ZESTRIL) 10 MG tablet Take 1 tablet (10 mg total) by mouth daily. 90 tablet 3  . Omega-3 Fatty Acids (FISH OIL) 1200 MG CAPS Take 4 capsules by mouth daily.       No facility-administered medications prior to visit.     Allergies  Allergen Reactions  . Iodine Rash  . Mercurochrome [Merbromin] Rash  . Sulfa Antibiotics Rash    Review of Systems  Constitutional: Positive for malaise/fatigue. Negative for fever.  HENT: Negative for congestion.   Eyes: Negative for blurred vision.  Respiratory: Negative for shortness of breath.   Cardiovascular: Negative for chest pain, palpitations and leg swelling.  Gastrointestinal: Negative for abdominal pain, blood in stool and nausea.  Genitourinary: Negative for dysuria and frequency.  Musculoskeletal: Negative for falls.  Skin: Negative for rash.  Neurological: Negative for dizziness, loss of consciousness and headaches.  Endo/Heme/Allergies: Negative for environmental allergies.  Psychiatric/Behavioral: Negative for depression. The patient is not nervous/anxious.        Objective:    Physical Exam  Constitutional: She is oriented to person, place, and time. She appears well-developed and well-nourished. No distress.  HENT:  Head: Normocephalic and atraumatic.  Eyes: Conjunctivae are normal.  Neck: Neck supple. No thyromegaly present.  Cardiovascular: Normal rate, regular rhythm and normal heart sounds.  No murmur heard. Pulmonary/Chest: Effort normal and breath sounds normal. No respiratory distress.  Abdominal: Soft. Bowel sounds are normal. She exhibits no distension and no mass. There is no tenderness.  Musculoskeletal: She exhibits no edema or tenderness.  Lymphadenopathy:    She has no cervical adenopathy.  Neurological: She is alert and oriented to person, place, and time.  Skin: Skin is warm and dry.  Psychiatric: She has a normal mood and  affect. Her behavior is normal.    BP 118/62 (BP Location: Left Arm, Patient Position: Sitting, Cuff Size: Normal)   Pulse (!) 58   Temp 97.9 F (36.6 C) (Oral)   Resp 18   Ht 5\' 5"  (1.651 m)   Wt 157 lb 12.8 oz (71.6 kg)   SpO2 97%   BMI 26.26 kg/m  Wt Readings from Last 3 Encounters:  10/25/17 157 lb 12.8 oz (71.6 kg)  04/26/17 155 lb (70.3 kg)  04/26/17 156 lb (  70.8 kg)   BP Readings from Last 3 Encounters:  10/25/17 118/62  04/26/17 133/74  04/26/17 120/74     Immunization History  Administered Date(s) Administered  . Influenza Split 05/30/2012  . Influenza, High Dose Seasonal PF 07/20/2014, 09/25/2016, 05/25/2017  . Influenza,inj,Quad PF,6+ Mos 05/15/2013, 09/23/2015  . Pneumococcal Conjugate-13 07/30/2014  . Pneumococcal Polysaccharide-23 09/23/2015  . Tdap 09/03/2011  . Zoster 03/28/2013    Health Maintenance  Topic Date Due  . MAMMOGRAM  10/01/2018  . COLONOSCOPY  12/20/2018  . TETANUS/TDAP  09/02/2021  . INFLUENZA VACCINE  Completed  . DEXA SCAN  Completed  . Hepatitis C Screening  Completed  . PNA vac Low Risk Adult  Completed    Lab Results  Component Value Date   WBC 6.8 04/26/2017   HGB 13.4 04/26/2017   HCT 39.2 04/26/2017   PLT 390.0 04/26/2017   GLUCOSE 102 (H) 04/26/2017   CHOL 154 04/26/2017   TRIG 156.0 (H) 04/26/2017   HDL 63.40 04/26/2017   LDLCALC 59 04/26/2017   ALT 66 (H) 04/26/2017   AST 41 (H) 04/26/2017   NA 137 04/26/2017   K 4.8 04/26/2017   CL 103 04/26/2017   CREATININE 0.83 04/26/2017   BUN 14 04/26/2017   CO2 29 04/26/2017   TSH 5.17 (H) 04/26/2017   HGBA1C 5.0 04/26/2017    Lab Results  Component Value Date   TSH 5.17 (H) 04/26/2017   Lab Results  Component Value Date   WBC 6.8 04/26/2017   HGB 13.4 04/26/2017   HCT 39.2 04/26/2017   MCV 95.4 04/26/2017   PLT 390.0 04/26/2017   Lab Results  Component Value Date   NA 137 04/26/2017   K 4.8 04/26/2017   CO2 29 04/26/2017   GLUCOSE 102 (H) 04/26/2017     BUN 14 04/26/2017   CREATININE 0.83 04/26/2017   BILITOT 0.5 04/26/2017   ALKPHOS 77 04/26/2017   AST 41 (H) 04/26/2017   ALT 66 (H) 04/26/2017   PROT 7.5 04/26/2017   ALBUMIN 4.7 04/26/2017   CALCIUM 10.5 04/26/2017   GFR 72.25 04/26/2017   Lab Results  Component Value Date   CHOL 154 04/26/2017   Lab Results  Component Value Date   HDL 63.40 04/26/2017   Lab Results  Component Value Date   LDLCALC 59 04/26/2017   Lab Results  Component Value Date   TRIG 156.0 (H) 04/26/2017   Lab Results  Component Value Date   CHOLHDL 2 04/26/2017   Lab Results  Component Value Date   HGBA1C 5.0 04/26/2017         Assessment & Plan:   Problem List Items Addressed This Visit    Osteoporosis (Chronic)    Started taking bisphosphanates well over 20 years ago. Tolerates Fosamax will continue to take for 6 more months, recheck Dexa and consider switch to Prolia      Relevant Medications   alendronate (FOSAMAX) 70 MG tablet   HTN (hypertension)    Well controlled, no changes to meds. Encouraged heart healthy diet such as the DASH diet and exercise as tolerated.       Relevant Medications   lisinopril (PRINIVIL,ZESTRIL) 10 MG tablet   atorvastatin (LIPITOR) 10 MG tablet   Other Relevant Orders   CBC   Comprehensive metabolic panel   TSH   Hyperlipidemia    Tolerating statin, encouraged heart healthy diet, avoid trans fats, minimize simple carbs and saturated fats. Increase exercise as tolerated  Relevant Medications   lisinopril (PRINIVIL,ZESTRIL) 10 MG tablet   atorvastatin (LIPITOR) 10 MG tablet   Other Relevant Orders   Lipid panel   Hypothyroidism    On Levothyroxine, continue to monitor      Relevant Medications   levothyroxine (SYNTHROID, LEVOTHROID) 150 MCG tablet   Hyperglycemia    hgba1c acceptable, minimize simple carbs. Increase exercise as tolerated.      Relevant Orders   Hemoglobin A1c   Annual physical exam    Patient encouraged to  maintain heart healthy diet, regular exercise, adequate sleep. Consider daily probiotics. Take medications as prescribed. Labs ordered today      Vitamin D deficiency    Taking 5000 IU twice weekly will check level      Relevant Orders   VITAMIN D 25 Hydroxy (Vit-D Deficiency, Fractures)      I have discontinued Zaidee L. Scurlock's Fish Oil and denosumab. I am also having her maintain her Calcium-D, aspirin EC, cetirizine, fluticasone, multivitamin, Krill Oil, lisinopril, atorvastatin, levothyroxine, and alendronate.  Meds ordered this encounter  Medications  . lisinopril (PRINIVIL,ZESTRIL) 10 MG tablet    Sig: Take 1 tablet (10 mg total) by mouth daily.    Dispense:  90 tablet    Refill:  3  . atorvastatin (LIPITOR) 10 MG tablet    Sig: Take 1 tablet (10 mg total) by mouth daily.    Dispense:  90 tablet    Refill:  3  . levothyroxine (SYNTHROID, LEVOTHROID) 150 MCG tablet    Sig: Take 1 tablet (150 mcg total) by mouth daily before breakfast.    Dispense:  90 tablet    Refill:  1  . alendronate (FOSAMAX) 70 MG tablet    Sig: Take 1 tablet (70 mg total) by mouth every 7 (seven) days. Take with a full glass of water on an empty stomach.    Dispense:  24 tablet    Refill:  0    CMA served as scribe during this visit. History, Physical and Plan performed by medical provider. Documentation and orders reviewed and attested to.  Penni Homans, MD

## 2017-10-25 NOTE — Assessment & Plan Note (Signed)
On Levothyroxine, continue to monitor 

## 2017-10-28 NOTE — Addendum Note (Signed)
Addended by: [name] A on: 10/28/2017 02:07 PM   Modules accepted: Orders

## 2018-04-25 ENCOUNTER — Ambulatory Visit (INDEPENDENT_AMBULATORY_CARE_PROVIDER_SITE_OTHER): Payer: Medicare HMO | Admitting: Family Medicine

## 2018-04-25 ENCOUNTER — Encounter: Payer: Self-pay | Admitting: Family Medicine

## 2018-04-25 VITALS — BP 112/70 | HR 61 | Temp 98.1°F | Resp 18 | Ht 65.0 in | Wt 156.2 lb

## 2018-04-25 DIAGNOSIS — I10 Essential (primary) hypertension: Secondary | ICD-10-CM | POA: Diagnosis not present

## 2018-04-25 DIAGNOSIS — Z23 Encounter for immunization: Secondary | ICD-10-CM

## 2018-04-25 DIAGNOSIS — E039 Hypothyroidism, unspecified: Secondary | ICD-10-CM | POA: Diagnosis not present

## 2018-04-25 DIAGNOSIS — E782 Mixed hyperlipidemia: Secondary | ICD-10-CM | POA: Diagnosis not present

## 2018-04-25 DIAGNOSIS — E559 Vitamin D deficiency, unspecified: Secondary | ICD-10-CM

## 2018-04-25 DIAGNOSIS — M810 Age-related osteoporosis without current pathological fracture: Secondary | ICD-10-CM | POA: Diagnosis not present

## 2018-04-25 DIAGNOSIS — R739 Hyperglycemia, unspecified: Secondary | ICD-10-CM

## 2018-04-25 LAB — COMPREHENSIVE METABOLIC PANEL
ALBUMIN: 4.6 g/dL (ref 3.5–5.2)
ALK PHOS: 54 U/L (ref 39–117)
ALT: 21 U/L (ref 0–35)
AST: 20 U/L (ref 0–37)
BUN: 17 mg/dL (ref 6–23)
CALCIUM: 10.2 mg/dL (ref 8.4–10.5)
CO2: 29 mEq/L (ref 19–32)
Chloride: 101 mEq/L (ref 96–112)
Creatinine, Ser: 0.9 mg/dL (ref 0.40–1.20)
GFR: 65.61 mL/min (ref >60.00)
Glucose, Bld: 99 mg/dL (ref 70–99)
POTASSIUM: 5 meq/L (ref 3.5–5.1)
Sodium: 136 mEq/L (ref 135–145)
Total Bilirubin: 0.9 mg/dL (ref 0.2–1.2)
Total Protein: 7.1 g/dL (ref 6.0–8.3)

## 2018-04-25 LAB — LIPID PANEL
CHOLESTEROL: 124 mg/dL (ref 0–200)
HDL: 56.9 mg/dL (ref >39.00)
LDL CALC: 40 mg/dL (ref 0–99)
NonHDL: 67.46
TRIGLYCERIDES: 136 mg/dL (ref 0.0–149.0)
Total CHOL/HDL Ratio: 2
VLDL: 27.2 mg/dL (ref 0.0–40.0)

## 2018-04-25 LAB — CBC
HEMATOCRIT: 39 % (ref 36.0–46.0)
HEMOGLOBIN: 13.3 g/dL (ref 12.0–15.0)
MCHC: 34.2 g/dL (ref 30.0–36.0)
MCV: 94.3 fl (ref 78.0–100.0)
PLATELETS: 300 10*3/uL (ref 150.0–400.0)
RBC: 4.13 Mil/uL (ref 3.87–5.11)
RDW: 13.1 % (ref 11.5–15.5)
WBC: 5.8 10*3/uL (ref 4.0–10.5)

## 2018-04-25 LAB — HEMOGLOBIN A1C: HEMOGLOBIN A1C: 5.2 % (ref 4.6–6.5)

## 2018-04-25 LAB — TSH: TSH: 1.53 u[IU]/mL (ref 0.35–4.50)

## 2018-04-25 MED ORDER — ATORVASTATIN CALCIUM 10 MG PO TABS: 10.0000 mg | ORAL_TABLET | Freq: Every day | ORAL | 3 refills | Status: DC

## 2018-04-25 MED ORDER — LEVOTHYROXINE SODIUM 150 MCG PO TABS: 150.0000 ug | ORAL_TABLET | Freq: Every day | ORAL | 1 refills | Status: DC

## 2018-04-25 MED ORDER — ALENDRONATE SODIUM 70 MG PO TABS: 70.0000 mg | ORAL_TABLET | ORAL | 0 refills | Status: DC

## 2018-04-25 MED ORDER — ZOSTER VAC RECOMB ADJUVANTED 50 MCG/0.5ML IM SUSR: 0.5000 mL | Freq: Once | INTRAMUSCULAR | 1 refills | Status: AC

## 2018-04-25 MED ORDER — LISINOPRIL 10 MG PO TABS: 10.0000 mg | ORAL_TABLET | Freq: Every day | ORAL | 3 refills | Status: DC

## 2018-04-25 NOTE — Progress Notes (Signed)
Subjective:  I acted as a Education administrator for Dr. Charlett Blake. [place], [place]  Patient ID: Michelle Jacobs, female    DOB: 06/14/1946, 72 y.o.   MRN: [id number]  No chief complaint on file.   HPI  Patient is in today for a 6 month follow up. She has no acute concerns. No recent febrile illness or acute hospitalizations. Denies CP/palp/SOB/HA/congestion/fevers/GI or GU c/o. Taking meds as prescribed. She is caring for her husband after his knee replacement so her back is bothering her at    Patient Care Team: Mosie Lukes, MD as PCP - General (Family Medicine)   Past Medical History:  Diagnosis Date  . Allergy   . Annual physical exam 10/06/2015  . GERD (gastroesophageal reflux disease)    mild  . History of chicken pox   . Hyperglycemia 05/20/2013  . Hyperlipidemia   . Hypertension   . Medicare annual wellness visit, subsequent 12/02/2011  . Personal history of colonic polyps 2009   Benign colon polyps  . Preventative health care 12/02/2011  . Verrucous skin lesion 11/04/2016    No past surgical history on file.  Family History  Problem Relation Age of Onset  . Cancer Mother        lung  . Hypertension Mother   . Heart disease Father        CHF  . Diabetes Maternal Uncle        type II  . Heart disease Maternal Uncle        s/p CABG  . Cancer Maternal Grandmother 47       breast  . Heart disease Maternal Grandmother        MI, CHF  . Obesity Maternal Grandmother   . Cancer Sister        uterine  . Hypertension Sister   . Hyperlipidemia Brother   . Hypertension Brother   . Cancer Brother        HPV associated oral cancer  . Hearing loss Brother   . Kidney disease Maternal Grandfather        kidney failure  . Cancer Paternal Grandmother   . COPD Paternal Grandfather   . Diabetes Maternal Aunt        older, type 2    Social History   Socioeconomic History  . Marital status: Married    Spouse name: Not on file  . Number of children: 2  . Years of education: Not on  file  . Highest education level: Not on file  Occupational History    Employer: [employer]  Social Needs  . Financial resource strain: Not on file  . Food insecurity:    Worry: Not on file    Inability: Not on file  . Transportation needs:    Medical: Not on file    Non-medical: Not on file  Tobacco Use  . Smoking status: Never Smoker  . Smokeless tobacco: Never Used  Substance and Sexual Activity  . Alcohol use: Yes    Alcohol/week: 10.0 standard drinks    Types: 10 Glasses of wine per week  . Drug use: No  . Sexual activity: Not on file    Comment: lives with husband, no dietary restrictions  Lifestyle  . Physical activity:    Days per week: Not on file    Minutes per session: Not on file  . Stress: Not on file  Relationships  . Social connections:    Talks on phone: Not on file  Gets together: Not on file    Attends religious service: Not on file    Active member of club or organization: Not on file    Attends meetings of clubs or organizations: Not on file    Relationship status: Not on file  . Intimate partner violence:    Fear of current or ex partner: Not on file    Emotionally abused: Not on file    Physically abused: Not on file    Forced sexual activity: Not on file  Other Topics Concern  . Not on file  Social History Narrative   Caffeine use:  2-3 cups coffee daily   Regular exercise:  Walks daily          Outpatient Medications Prior to Visit  Medication Sig Dispense Refill  . aspirin EC 81 MG tablet Take 81 mg by mouth daily.      . cetirizine (ZYRTEC) 10 MG tablet Take 10 mg by mouth daily.    Javier Docker Oil 1000 MG CAPS Take 1,000 mg by mouth daily.    . Multiple Vitamin (MULTIVITAMIN) tablet Take 1 tablet by mouth daily.    Marland Kitchen alendronate (FOSAMAX) 70 MG tablet Take 1 tablet (70 mg total) by mouth every 7 (seven) days. Take with a full glass of water on an empty stomach. 24 tablet 0  . atorvastatin (LIPITOR) 10 MG tablet Take 1  tablet (10 mg total) by mouth daily. 90 tablet 3  . levothyroxine (SYNTHROID, LEVOTHROID) 150 MCG tablet Take 1 tablet (150 mcg total) by mouth daily before breakfast. 90 tablet 1  . lisinopril (PRINIVIL,ZESTRIL) 10 MG tablet Take 1 tablet (10 mg total) by mouth daily. 90 tablet 3  . Calcium Carbonate-Vitamin D (CALCIUM-D) 600-400 MG-UNIT TABS Take 1 tablet by mouth daily.     . fluticasone (FLONASE) 50 MCG/ACT nasal spray Place 2 sprays into both nostrils daily. 16 g 1   No facility-administered medications prior to visit.     Allergies  Allergen Reactions  . Iodine Rash  . Mercurochrome [Merbromin] Rash  . Sulfa Antibiotics Rash    Review of Systems  Constitutional: Negative for fever and malaise/fatigue.  HENT: Negative for congestion.   Eyes: Negative for blurred vision.  Respiratory: Negative for shortness of breath.   Cardiovascular: Negative for chest pain, palpitations and leg swelling.  Gastrointestinal: Negative for abdominal pain, blood in stool and nausea.  Genitourinary: Negative for dysuria and frequency.  Musculoskeletal: Negative for falls.  Skin: Negative for rash.  Neurological: Negative for dizziness, loss of consciousness and headaches.  Endo/Heme/Allergies: Negative for environmental allergies.  Psychiatric/Behavioral: Negative for depression. The patient is not nervous/anxious.        Objective:    Physical Exam  Constitutional: She is oriented to person, place, and time. She appears well-developed and well-nourished. No distress.  HENT:  Head: Normocephalic and atraumatic.  Nose: Nose normal.  Eyes: Right eye exhibits no discharge. Left eye exhibits no discharge.  Neck: Normal range of motion. Neck supple.  Cardiovascular: Normal rate and regular rhythm.  No murmur heard. Pulmonary/Chest: Effort normal and breath sounds normal.  Abdominal: Soft. Bowel sounds are normal. There is no tenderness.  Musculoskeletal: She exhibits no edema.  Neurological:  She is alert and oriented to person, place, and time.  Skin: Skin is warm and dry.  Psychiatric: She has a normal mood and affect.  Nursing note and vitals reviewed.   BP 112/70 (BP Location: Left Arm, Patient Position: Sitting, Cuff Size: Normal)  Pulse 61   Temp 98.1 F (36.7 C) (Oral)   Resp 18   Ht 5\' 5"  (1.651 m)   Wt 156 lb 3.2 oz (70.9 kg)   SpO2 96%   BMI 25.99 kg/m  Wt Readings from Last 3 Encounters:  04/25/18 156 lb 3.2 oz (70.9 kg)  10/25/17 157 lb 12.8 oz (71.6 kg)  04/26/17 155 lb (70.3 kg)   BP Readings from Last 3 Encounters:  04/25/18 112/70  10/25/17 118/62  04/26/17 133/74     Immunization History  Administered Date(s) Administered  . Influenza Split 05/30/2012  . Influenza, High Dose Seasonal PF 07/20/2014, 09/25/2016, 05/25/2017  . Influenza,inj,Quad PF,6+ Mos 05/15/2013, 09/23/2015  . Pneumococcal Conjugate-13 07/30/2014  . Pneumococcal Polysaccharide-23 09/23/2015  . Tdap 09/03/2011  . Zoster 03/28/2013    Health Maintenance  Topic Date Due  . INFLUENZA VACCINE  03/31/2018  . MAMMOGRAM  10/01/2018  . COLONOSCOPY  12/20/2018  . TETANUS/TDAP  09/02/2021  . DEXA SCAN  Completed  . Hepatitis C Screening  Completed  . PNA vac Low Risk Adult  Completed    Lab Results  Component Value Date   WBC 5.1 10/25/2017   HGB 13.4 10/25/2017   HCT 38.4 10/25/2017   PLT 340.0 10/25/2017   GLUCOSE 95 10/25/2017   CHOL 129 10/25/2017   TRIG 111.0 10/25/2017   HDL 54.70 10/25/2017   LDLCALC 52 10/25/2017   ALT 49 (H) 10/25/2017   AST 25 10/25/2017   NA 139 10/25/2017   K 4.6 10/25/2017   CL 104 10/25/2017   CREATININE 0.80 10/25/2017   BUN 14 10/25/2017   CO2 29 10/25/2017   TSH 0.18 (L) 10/25/2017   HGBA1C 5.1 10/25/2017    Lab Results  Component Value Date   TSH 0.18 (L) 10/25/2017   Lab Results  Component Value Date   WBC 5.1 10/25/2017   HGB 13.4 10/25/2017   HCT 38.4 10/25/2017   MCV 92.3 10/25/2017   PLT 340.0 10/25/2017    Lab Results  Component Value Date   NA 139 10/25/2017   K 4.6 10/25/2017   CO2 29 10/25/2017   GLUCOSE 95 10/25/2017   BUN 14 10/25/2017   CREATININE 0.80 10/25/2017   BILITOT 0.7 10/25/2017   ALKPHOS 67 10/25/2017   AST 25 10/25/2017   ALT 49 (H) 10/25/2017   PROT 7.2 10/25/2017   ALBUMIN 4.3 10/25/2017   CALCIUM 10.2 10/25/2017   GFR 75.27 10/25/2017   Lab Results  Component Value Date   CHOL 129 10/25/2017   Lab Results  Component Value Date   HDL 54.70 10/25/2017   Lab Results  Component Value Date   LDLCALC 52 10/25/2017   Lab Results  Component Value Date   TRIG 111.0 10/25/2017   Lab Results  Component Value Date   CHOLHDL 2 10/25/2017   Lab Results  Component Value Date   HGBA1C 5.1 10/25/2017         Assessment & Plan:   Problem List Items Addressed This Visit    Osteoporosis (Chronic)    Encouraged to get adequate exercise, calcium and vitamin d intake      Relevant Medications   alendronate (FOSAMAX) 70 MG tablet   HTN (hypertension)    Well controlled, no changes to meds. Encouraged heart healthy diet such as the DASH diet and exercise as tolerated.       Relevant Medications   lisinopril (PRINIVIL,ZESTRIL) 10 MG tablet   atorvastatin (LIPITOR) 10 MG tablet  Other Relevant Orders   CBC   Comprehensive metabolic panel   Hyperlipidemia    Tolerating statin, encouraged heart healthy diet, avoid trans fats, minimize simple carbs and saturated fats. Increase exercise as tolerated      Relevant Medications   lisinopril (PRINIVIL,ZESTRIL) 10 MG tablet   atorvastatin (LIPITOR) 10 MG tablet   Other Relevant Orders   Lipid panel   Hypothyroidism    On Levothyroxine, continue to monitor      Relevant Medications   levothyroxine (SYNTHROID, LEVOTHROID) 150 MCG tablet   Other Relevant Orders   TSH   Hyperglycemia    hgba1c acceptable, minimize simple carbs. Increase exercise as tolerated.       Relevant Orders   Hemoglobin A1c    Comprehensive metabolic panel   Vitamin D deficiency    Monitor and supplement      Relevant Orders   VITAMIN D 25 Hydroxy (Vit-D Deficiency, Fractures)    Other Visit Diagnoses    Need for viral immunization    -  Primary   Relevant Medications   Zoster Vaccine Adjuvanted [facility]) injection      I have discontinued Riley L. Narducci's Calcium-D and fluticasone. I am also having her start on Zoster Vaccine Adjuvanted. Additionally, I am having her maintain her aspirin EC, cetirizine, multivitamin, Krill Oil, lisinopril, alendronate, atorvastatin, and levothyroxine.  Meds ordered this encounter  Medications  . lisinopril (PRINIVIL,ZESTRIL) 10 MG tablet    Sig: Take 1 tablet (10 mg total) by mouth daily.    Dispense:  90 tablet    Refill:  3  . alendronate (FOSAMAX) 70 MG tablet    Sig: Take 1 tablet (70 mg total) by mouth every 7 (seven) days. Take with a full glass of water on an empty stomach.    Dispense:  24 tablet    Refill:  0  . atorvastatin (LIPITOR) 10 MG tablet    Sig: Take 1 tablet (10 mg total) by mouth daily.    Dispense:  90 tablet    Refill:  3  . levothyroxine (SYNTHROID, LEVOTHROID) 150 MCG tablet    Sig: Take 1 tablet (150 mcg total) by mouth daily before breakfast.    Dispense:  90 tablet    Refill:  1  . Zoster Vaccine Adjuvanted [facility]) injection    Sig: Inject 0.5 mLs into the muscle once for 1 dose.    Dispense:  0.5 mL    Refill:  1    CMA served as scribe during this visit. History, Physical and Plan performed by medical provider. Documentation and orders reviewed and attested to.  Penni Homans, MD

## 2018-04-25 NOTE — Assessment & Plan Note (Signed)
hgba1c acceptable, minimize simple carbs. Increase exercise as tolerated.  

## 2018-04-25 NOTE — Assessment & Plan Note (Signed)
Monitor and supplement 

## 2018-04-25 NOTE — Assessment & Plan Note (Signed)
On Levothyroxine, continue to monitor 

## 2018-04-25 NOTE — Patient Instructions (Addendum)
Encouraged increased hydration and fiber in diet. Daily probiotics. If bowels not moving can use MOM 2 tbls po in 4 oz of warm prune juice by mouth every 2-3 days. If no results then repeat in 4 hours with  Dulcolax suppository pr, may repeat again in 4 more hours as needed. Seek care if symptoms worsen. Consider daily Miralax and/or Dulcolax if symptoms persist.      Carbohydrate Counting for Adult Carbohydrate counting is a method for keeping track of how many carbohydrates you eat. Eating carbohydrates naturally increases the amount of sugar (glucose) in the blood. Counting how many carbohydrates you eat helps keep your blood glucose within normal limits, which helps you manage your diabetes (diabetes mellitus). It is important to know how many carbohydrates you can safely have in each meal. This is different for every person. A diet and nutrition specialist (registered dietitian) can help you make a meal plan and calculate how many carbohydrates you should have at each meal and snack. Carbohydrates are found in the following foods:  Grains, such as breads and cereals.  Dried beans and soy products.  Starchy vegetables, such as potatoes, peas, and corn.  Fruit and fruit juices.  Milk and yogurt.  Sweets and snack foods, such as cake, cookies, candy, chips, and soft drinks.  How do I count carbohydrates? There are two ways to count carbohydrates in food. You can use either of the methods or a combination of both. Reading "Nutrition Facts" on packaged food The "Nutrition Facts" list is included on the labels of almost all packaged foods and beverages in the [place] It includes:  The serving size.  Information about nutrients in each serving, including the grams (g) of carbohydrate per serving.  To use the "Nutrition Facts":  Decide how many servings you will have.  Multiply the number of servings by the number of carbohydrates per serving.  The resulting number is the total amount  of carbohydrates that you will be having.  Learning standard serving sizes of other foods When you eat foods containing carbohydrates that are not packaged or do not include "Nutrition Facts" on the label, you need to measure the servings in order to count the amount of carbohydrates:  Measure the foods that you will eat with a food scale or measuring cup, if needed.  Decide how many standard-size servings you will eat.  Multiply the number of servings by 15. Most carbohydrate-rich foods have about 15 g of carbohydrates per serving. ? For example, if you eat 8 oz (170 g) of strawberries, you will have eaten 2 servings and 30 g of carbohydrates (2 servings x 15 g = 30 g).  For foods that have more than one food mixed, such as soups and casseroles, you must count the carbohydrates in each food that is included.  The following list contains standard serving sizes of common carbohydrate-rich foods. Each of these servings has about 15 g of carbohydrates:   hamburger bun or  English muffin.   oz (15 mL) syrup.   oz (14 g) jelly.  1 slice of bread.  1 six-inch tortilla.  3 oz (85 g) cooked rice or pasta.  4 oz (113 g) cooked dried beans.  4 oz (113 g) starchy vegetable, such as peas, corn, or potatoes.  4 oz (113 g) hot cereal.  4 oz (113 g) mashed potatoes or  of a large baked potato.  4 oz (113 g) canned or frozen fruit.  4 oz (120 mL) fruit juice.  4-6 crackers.  6 chicken nuggets.  6 oz (170 g) unsweetened dry cereal.  6 oz (170 g) plain fat-free yogurt or yogurt sweetened with artificial sweeteners.  8 oz (240 mL) milk.  8 oz (170 g) fresh fruit or one small piece of fruit.  24 oz (680 g) popped popcorn.  Example of carbohydrate counting Sample meal  3 oz (85 g) chicken breast.  6 oz (170 g) brown rice.  4 oz (113 g) corn.  8 oz (240 mL) milk.  8 oz (170 g) strawberries with sugar-free whipped topping. Carbohydrate calculation 1. Identify the  foods that contain carbohydrates: ? Rice. ? Corn. ? Milk. ? Strawberries. 2. Calculate how many servings you have of each food: ? 2 servings rice. ? 1 serving corn. ? 1 serving milk. ? 1 serving strawberries. 3. Multiply each number of servings by 15 g: ? 2 servings rice x 15 g = 30 g. ? 1 serving corn x 15 g = 15 g. ? 1 serving milk x 15 g = 15 g. ? 1 serving strawberries x 15 g = 15 g. 4. Add together all of the amounts to find the total grams of carbohydrates eaten: ? 30 g + 15 g + 15 g + 15 g = 75 g of carbohydrates total. This information is not intended to replace advice given to you by your health care provider. Make sure you discuss any questions you have with your health care provider. Document Released: 08/17/2005 Document Revised: 03/06/2016 Document Reviewed: 01/29/2016 Elsevier Interactive Patient Education  2018 [employer].  Hypertension Hypertension is another name for high blood pressure. High blood pressure forces your heart to work harder to pump blood. This can cause problems over time. There are two numbers in a blood pressure reading. There is a top number (systolic) over a bottom number (diastolic). It is best to have a blood pressure below 120/80. Healthy choices can help lower your blood pressure. You may need medicine to help lower your blood pressure if:  Your blood pressure cannot be lowered with healthy choices.  Your blood pressure is higher than 130/80.  Follow these instructions at home: Eating and drinking  If directed, follow the DASH eating plan. This diet includes: ? Filling half of your plate at each meal with fruits and vegetables. ? Filling one quarter of your plate at each meal with whole grains. Whole grains include whole wheat pasta, brown rice, and whole grain bread. ? Eating or drinking low-fat dairy products, such as skim milk or low-fat yogurt. ? Filling one quarter of your plate at each meal with low-fat (lean) proteins. Low-fat  proteins include fish, skinless chicken, eggs, beans, and tofu. ? Avoiding fatty meat, cured and processed meat, or chicken with skin. ? Avoiding premade or processed food.  Eat less than 1,500 mg of salt (sodium) a day.  Limit alcohol use to no more than 1 drink a day for nonpregnant women and 2 drinks a day for men. One drink equals 12 oz of beer, 5 oz of wine, or 1 oz of hard liquor. Lifestyle  Work with your doctor to stay at a healthy weight or to lose weight. Ask your doctor what the best weight is for you.  Get at least 30 minutes of exercise that causes your heart to beat faster (aerobic exercise) most days of the week. This may include walking, swimming, or biking.  Get at least 30 minutes of exercise that strengthens your muscles (resistance exercise) at least  3 days a week. This may include lifting weights or pilates.  Do not use any products that contain nicotine or tobacco. This includes cigarettes and e-cigarettes. If you need help quitting, ask your doctor.  Check your blood pressure at home as told by your doctor.  Keep all follow-up visits as told by your doctor. This is important. Medicines  Take over-the-counter and prescription medicines only as told by your doctor. Follow directions carefully.  Do not skip doses of blood pressure medicine. The medicine does not work as well if you skip doses. Skipping doses also puts you at risk for problems.  Ask your doctor about side effects or reactions to medicines that you should watch for. Contact a doctor if:  You think you are having a reaction to the medicine you are taking.  You have headaches that keep coming back (recurring).  You feel dizzy.  You have swelling in your ankles.  You have trouble with your vision. Get help right away if:  You get a very bad headache.  You start to feel confused.  You feel weak or numb.  You feel faint.  You get very bad pain in your: ? Chest. ? Belly (abdomen).  You  throw up (vomit) more than once.  You have trouble breathing. Summary  Hypertension is another name for high blood pressure.  Making healthy choices can help lower blood pressure. If your blood pressure cannot be controlled with healthy choices, you may need to take medicine. This information is not intended to replace advice given to you by your health care provider. Make sure you discuss any questions you have with your health care provider. Document Released: 02/03/2008 Document Revised: 07/15/2016 Document Reviewed: 07/15/2016 Elsevier Interactive Patient Education  [employer].

## 2018-04-25 NOTE — Assessment & Plan Note (Signed)
Well controlled, no changes to meds. Encouraged heart healthy diet such as the DASH diet and exercise as tolerated.  °

## 2018-04-25 NOTE — Assessment & Plan Note (Signed)
Encouraged to get adequate exercise, calcium and vitamin d intake 

## 2018-04-25 NOTE — Assessment & Plan Note (Signed)
Tolerating statin, encouraged heart healthy diet, avoid trans fats, minimize simple carbs and saturated fats. Increase exercise as tolerated 

## 2018-04-26 LAB — VITAMIN D 25 HYDROXY (VIT D DEFICIENCY, FRACTURES): VITD: 29.32 ng/mL — ABNORMAL LOW (ref 30.00–100.00)

## 2018-06-03 ENCOUNTER — Ambulatory Visit (INDEPENDENT_AMBULATORY_CARE_PROVIDER_SITE_OTHER): Payer: Medicare HMO

## 2018-06-03 DIAGNOSIS — Z23 Encounter for immunization: Secondary | ICD-10-CM | POA: Diagnosis not present

## 2018-10-25 ENCOUNTER — Ambulatory Visit: Payer: Medicare HMO | Admitting: *Deleted

## 2018-10-25 ENCOUNTER — Encounter: Payer: Self-pay | Admitting: Medical

## 2018-10-25 ENCOUNTER — Ambulatory Visit (INDEPENDENT_AMBULATORY_CARE_PROVIDER_SITE_OTHER): Payer: Medicare HMO | Admitting: Medical

## 2018-10-25 VITALS — BP 146/75 | HR 72 | Temp 99.0°F | Resp 16 | Ht 60.0 in | Wt 160.0 lb

## 2018-10-25 DIAGNOSIS — M791 Myalgia, unspecified site: Secondary | ICD-10-CM | POA: Diagnosis not present

## 2018-10-25 DIAGNOSIS — J029 Acute pharyngitis, unspecified: Secondary | ICD-10-CM | POA: Diagnosis not present

## 2018-10-25 DIAGNOSIS — R059 Cough, unspecified: Secondary | ICD-10-CM

## 2018-10-25 DIAGNOSIS — R0981 Nasal congestion: Secondary | ICD-10-CM | POA: Diagnosis not present

## 2018-10-25 DIAGNOSIS — R05 Cough: Secondary | ICD-10-CM

## 2018-10-25 LAB — POC INFLUENZA A&B (BINAX/QUICKVUE)
Influenza A, POC: NEGATIVE
Influenza B, POC: NEGATIVE

## 2018-10-25 LAB — POCT RAPID STREP A (OFFICE): Rapid Strep A Screen: POSITIVE — AB

## 2018-10-25 MED ORDER — AMOXICILLIN 875 MG PO TABS: 875.0000 mg | ORAL_TABLET | Freq: Two times a day (BID) | ORAL | 0 refills | Status: DC

## 2018-10-25 MED ORDER — FLUTICASONE PROPIONATE 50 MCG/ACT NA SUSP: 2.0000 | Freq: Every day | NASAL | 1 refills | Status: DC

## 2018-10-25 MED ORDER — AMOXICILLIN-POT CLAVULANATE 875-125 MG PO TABS: 1.0000 | ORAL_TABLET | Freq: Two times a day (BID) | ORAL | 0 refills | Status: DC

## 2018-10-25 MED ORDER — BENZONATATE 100 MG PO CAPS: 100.0000 mg | ORAL_CAPSULE | Freq: Three times a day (TID) | ORAL | 0 refills | Status: DC | PRN

## 2018-10-25 NOTE — Progress Notes (Signed)
Subjective:    Patient ID: Michelle Jacobs, female    DOB: [birth year], 73 y.o.   MRN: [id number]  HPI  Pt in with some recent symptoms since past Friday. She states since Friday some fever, very st, nasal congestion, sinus pressure and producitve cough.  ST hurts even without swallowing. Pt states sons family and grandsons have been sick. Pt not aware of family diagnosis.  Pt visits with family briefly almost every day  Some mild muscle aches in back.  Some ha when gets fever.     Review of Systems  Constitutional: Positive for fatigue and fever. Negative for chills.  HENT: Positive for congestion, sinus pressure and sore throat. Negative for ear pain and postnasal drip.   Respiratory: Positive for cough. Negative for shortness of breath and wheezing.   Cardiovascular: Negative for chest pain and palpitations.  Gastrointestinal: Negative for abdominal pain.  Musculoskeletal: Positive for myalgias.  Neurological: Negative for dizziness, seizures, weakness and headaches.  Hematological: Negative for adenopathy. Does not bruise/bleed easily.  Psychiatric/Behavioral: Negative for behavioral problems and confusion.    Past Medical History:  Diagnosis Date  . Allergy   . Annual physical exam 10/06/2015  . GERD (gastroesophageal reflux disease)    mild  . History of chicken pox   . Hyperglycemia 05/20/2013  . Hyperlipidemia   . Hypertension   . Medicare annual wellness visit, subsequent 12/02/2011  . Personal history of colonic polyps 2009   Benign colon polyps  . Preventative health care 12/02/2011  . Verrucous skin lesion 11/04/2016     Social History   Socioeconomic History  . Marital status: Married    Spouse name: Not on file  . Number of children: 2  . Years of education: Not on file  . Highest education level: Not on file  Occupational History    Employer: [employer]  Social Needs  . Financial resource strain: Not on file  . Food insecurity:   Worry: Not on file    Inability: Not on file  . Transportation needs:    Medical: Not on file    Non-medical: Not on file  Tobacco Use  . Smoking status: Never Smoker  . Smokeless tobacco: Never Used  Substance and Sexual Activity  . Alcohol use: Yes    Alcohol/week: 10.0 standard drinks    Types: 10 Glasses of wine per week  . Drug use: No  . Sexual activity: Not on file    Comment: lives with husband, no dietary restrictions  Lifestyle  . Physical activity:    Days per week: Not on file    Minutes per session: Not on file  . Stress: Not on file  Relationships  . Social connections:    Talks on phone: Not on file    Gets together: Not on file    Attends religious service: Not on file    Active member of club or organization: Not on file    Attends meetings of clubs or organizations: Not on file    Relationship status: Not on file  . Intimate partner violence:    Fear of current or ex partner: Not on file    Emotionally abused: Not on file    Physically abused: Not on file    Forced sexual activity: Not on file  Other Topics Concern  . Not on file  Social History Narrative   Caffeine use:  2-3 cups coffee daily   Regular exercise:  Walks daily  No past surgical history on file.  Family History  Problem Relation Age of Onset  . Cancer Mother        lung  . Hypertension Mother   . Heart disease Father        CHF  . Diabetes Maternal Uncle        type II  . Heart disease Maternal Uncle        s/p CABG  . Cancer Maternal Grandmother 17       breast  . Heart disease Maternal Grandmother        MI, CHF  . Obesity Maternal Grandmother   . Cancer Sister        uterine  . Hypertension Sister   . Hyperlipidemia Brother   . Hypertension Brother   . Cancer Brother        HPV associated oral cancer  . Hearing loss Brother   . Kidney disease Maternal Grandfather        kidney failure  . Cancer Paternal Grandmother   . COPD Paternal Grandfather   .  Diabetes Maternal Aunt        older, type 2    Allergies  Allergen Reactions  . Iodine Rash  . Mercurochrome [Merbromin] Rash  . Sulfa Antibiotics Rash    Current Outpatient Medications on File Prior to Visit  Medication Sig Dispense Refill  . alendronate (FOSAMAX) 70 MG tablet Take 1 tablet (70 mg total) by mouth every 7 (seven) days. Take with a full glass of water on an empty stomach. 24 tablet 0  . aspirin EC 81 MG tablet Take 81 mg by mouth daily.      Marland Kitchen atorvastatin (LIPITOR) 10 MG tablet Take 1 tablet (10 mg total) by mouth daily. 90 tablet 3  . cetirizine (ZYRTEC) 10 MG tablet Take 10 mg by mouth daily.    Javier Docker Oil 1000 MG CAPS Take 1,000 mg by mouth daily.    Marland Kitchen levothyroxine (SYNTHROID, LEVOTHROID) 150 MCG tablet Take 1 tablet (150 mcg total) by mouth daily before breakfast. 90 tablet 1  . lisinopril (PRINIVIL,ZESTRIL) 10 MG tablet Take 1 tablet (10 mg total) by mouth daily. 90 tablet 3  . Multiple Vitamin (MULTIVITAMIN) tablet Take 1 tablet by mouth daily.     No current facility-administered medications on file prior to visit.     BP (!) 190/74   Pulse 72   Temp 99 F (37.2 C) (Oral)   Resp 16   Ht 5' (1.524 m)   Wt 160 lb (72.6 kg)   SpO2 97%   BMI 31.25 kg/m       Objective:   Physical Exam  General  Mental Status - Alert. General Appearance - Well groomed. Not in acute distress.  Skin Rashes- No Rashes.  HEENT Head- Normal. Ear Auditory Canal - Left- Normal. Right - Normal.Tympanic Membrane- Left- Normal. Right- Normal. Eye Sclera/Conjunctiva- Left- Normal. Right- Normal. Nose & Sinuses Nasal Mucosa- Left-  Boggy and Congested. Right-  Boggy and  Congested.Bilateral no maxillary and no  frontal sinus pressure. Mouth & Throat Lips: Upper Lip- Normal: no dryness, cracking, pallor, cyanosis, or vesicular eruption. Lower Lip-Normal: no dryness, cracking, pallor, cyanosis or vesicular eruption. Buccal Mucosa- Bilateral- No Aphthous  ulcers. Oropharynx- No Discharge or Erythema. Tonsils: Characteristics- Bilateral- No Erythema or Congestion. Size/Enlargement- Bilateral- No enlargement. Discharge- bilateral-None.  Neck Neck- Supple. No Masses.   Chest and Lung Exam Auscultation: Breath Sounds:-Clear even and unlabored.  Cardiovascular Auscultation:Rythm- Regular, rate and  rhythm. Murmurs & Other Heart Sounds:Ausculatation of the heart reveal- No Murmurs.  Lymphatic Head & Neck General Head & Neck Lymphatics: Bilateral: Description- No Localized lymphadenopathy.        Assessment & Plan:  Your strep test was positive. I am prescribing antibiotic amoxicillin. Rest hydrate, tylenol for fever and warm salt water gargles.   For recent nasal congestion, prescribed Flonase.  For intermittent cough, I am making benzonatate cough medicine available.  Rx advisement given.  Your flu test came back negative.  Follow-up in 7 to 10 days or as needed.

## 2018-10-25 NOTE — Patient Instructions (Signed)
Your strep test was positive. I am prescribing antibiotic amoxicillin. Rest hydrate, tylenol for fever and warm salt water gargles.   For recent nasal congestion, prescribed Flonase.  For intermittent cough, I am making benzonatate cough medicine available.  Rx advisement given.  Your flu test came back negative.  Follow-up in 7 to 10 days or as needed.

## 2018-11-02 NOTE — Progress Notes (Deleted)
Subjective:   Michelle Jacobs is a 73 y.o. female who presents for Medicare Annual (Subsequent) preventive examination.  Review of Systems:No ROS.  Medicare Wellness Visit. Additional risk factors are reflected in the social history.   Sleep patterns: Home Safety/Smoke Alarms: Feels safe in home. Smoke alarms in place.    Female:      Mammo-      Dexa scan-        CCS-    Objective:     Vitals: There were no vitals taken for this visit.  There is no height or weight on file to calculate BMI.  Advanced Directives 09/25/2016 09/23/2015  Does Patient Have a Medical Advance Directive? No;Yes Yes  Type of Paramedic of [place];Living will [place];Living will  Does patient want to make changes to medical advance directive? Yes (MAU/Ambulatory/Procedural Areas - Information given) No - Patient declined  Copy of [place] in Chart? Yes No - copy requested  Would patient like information on creating a medical advance directive? No - Patient declined -    Tobacco Social History   Tobacco Use  Smoking Status Never Smoker  Smokeless Tobacco Never Used     Counseling given: Not Answered   Clinical Intake:                       Past Medical History:  Diagnosis Date  . Allergy   . Annual physical exam 10/06/2015  . GERD (gastroesophageal reflux disease)    mild  . History of chicken pox   . Hyperglycemia 05/20/2013  . Hyperlipidemia   . Hypertension   . Medicare annual wellness visit, subsequent 12/02/2011  . Personal history of colonic polyps 2009   Benign colon polyps  . Preventative health care 12/02/2011  . Verrucous skin lesion 11/04/2016   No past surgical history on file. Family History  Problem Relation Age of Onset  . Cancer Mother        lung  . Hypertension Mother   . Heart disease Father        CHF  . Diabetes Maternal Uncle        type II  . Heart disease Maternal Uncle    s/p CABG  . Cancer Maternal Grandmother 54       breast  . Heart disease Maternal Grandmother        MI, CHF  . Obesity Maternal Grandmother   . Cancer Sister        uterine  . Hypertension Sister   . Hyperlipidemia Brother   . Hypertension Brother   . Cancer Brother        HPV associated oral cancer  . Hearing loss Brother   . Kidney disease Maternal Grandfather        kidney failure  . Cancer Paternal Grandmother   . COPD Paternal Grandfather   . Diabetes Maternal Aunt        older, type 2   Social History   Socioeconomic History  . Marital status: Married    Spouse name: Not on file  . Number of children: 2  . Years of education: Not on file  . Highest education level: Not on file  Occupational History    Employer: [employer]  Social Needs  . Financial resource strain: Not on file  . Food insecurity:    Worry: Not on file    Inability: Not on file  . Transportation  needs:    Medical: Not on file    Non-medical: Not on file  Tobacco Use  . Smoking status: Never Smoker  . Smokeless tobacco: Never Used  Substance and Sexual Activity  . Alcohol use: Yes    Alcohol/week: 10.0 standard drinks    Types: 10 Glasses of wine per week  . Drug use: No  . Sexual activity: Not on file    Comment: lives with husband, no dietary restrictions  Lifestyle  . Physical activity:    Days per week: Not on file    Minutes per session: Not on file  . Stress: Not on file  Relationships  . Social connections:    Talks on phone: Not on file    Gets together: Not on file    Attends religious service: Not on file    Active member of club or organization: Not on file    Attends meetings of clubs or organizations: Not on file    Relationship status: Not on file  Other Topics Concern  . Not on file  Social History Narrative   Caffeine use:  2-3 cups coffee daily   Regular exercise:  Walks daily          Outpatient Encounter Medications as of 11/03/2018    Medication Sig  . alendronate (FOSAMAX) 70 MG tablet Take 1 tablet (70 mg total) by mouth every 7 (seven) days. Take with a full glass of water on an empty stomach.  Marland Kitchen amoxicillin (AMOXIL) 875 MG tablet Take 1 tablet (875 mg total) by mouth 2 (two) times daily. Just fill amoxicillin/not augmentin  . amoxicillin-clavulanate (AUGMENTIN) 875-125 MG tablet Take 1 tablet by mouth 2 (two) times daily.  Marland Kitchen aspirin EC 81 MG tablet Take 81 mg by mouth daily.    Marland Kitchen atorvastatin (LIPITOR) 10 MG tablet Take 1 tablet (10 mg total) by mouth daily.  . benzonatate (TESSALON) 100 MG capsule Take 1 capsule (100 mg total) by mouth 3 (three) times daily as needed for cough.  . cetirizine (ZYRTEC) 10 MG tablet Take 10 mg by mouth daily.  . fluticasone (FLONASE) 50 MCG/ACT nasal spray Place 2 sprays into both nostrils daily.  Javier Docker Oil 1000 MG CAPS Take 1,000 mg by mouth daily.  Marland Kitchen levothyroxine (SYNTHROID, LEVOTHROID) 150 MCG tablet Take 1 tablet (150 mcg total) by mouth daily before breakfast.  . lisinopril (PRINIVIL,ZESTRIL) 10 MG tablet Take 1 tablet (10 mg total) by mouth daily.  . Multiple Vitamin (MULTIVITAMIN) tablet Take 1 tablet by mouth daily.   No facility-administered encounter medications on file as of 11/03/2018.     Activities of Daily Living No flowsheet data found.  Patient Care Team: Mosie Lukes, MD as PCP - General (Family Medicine)    Assessment:   This is a routine wellness examination for [facility]. Physical assessment deferred to PCP.  Exercise Activities and Dietary recommendations   Diet (meal preparation, eat out, water intake, caffeinated beverages, dairy products, fruits and vegetables): {Desc; diets:16563} Breakfast: Lunch:  Dinner:      Goals    . Quit work this summer.       Fall Risk Fall Risk  09/25/2016 11/14/2015 09/23/2015 01/23/2014  Falls in the past year? No Yes Yes No  Number falls in past yr: - 1 1 -  Comment - - patient fell holding too many items and  tried to catch one. -  Injury with Fall? - No No -  Risk for fall due to : - Impaired  balance/gait - -  Follow up - Falls prevention discussed - -    Depression Screen PHQ 2/9 Scores 09/25/2016 11/14/2015 09/23/2015 01/23/2014  PHQ - 2 Score 0 0 0 0  Exception Documentation - Patient refusal - -     Cognitive Function MMSE - Mini Mental State Exam 09/25/2016  Orientation to time 5  Orientation to Place 5  Registration 3  Attention/ Calculation 5  Recall 3  Language- name 2 objects 2  Language- repeat 1  Language- follow 3 step command 3  Language- read & follow direction 1  Write a sentence 1  Copy design 1  Total score 30        Immunization History  Administered Date(s) Administered  . Influenza Split 05/30/2012  . Influenza, High Dose Seasonal PF 07/20/2014, 09/25/2016, 05/25/2017  . Influenza,inj,Quad PF,6+ Mos 05/15/2013, 09/23/2015  . Pneumococcal Conjugate-13 07/30/2014  . Pneumococcal Polysaccharide-23 09/23/2015  . Tdap 09/03/2011  . Zoster 03/28/2013  . Zoster Recombinat (Shingrix) 06/03/2018    Screening Tests Health Maintenance  Topic Date Due  . INFLUENZA VACCINE  03/31/2018  . MAMMOGRAM  10/01/2018  . COLONOSCOPY  12/20/2018  . TETANUS/TDAP  09/02/2021  . DEXA SCAN  Completed  . Hepatitis C Screening  Completed  . PNA vac Low Risk Adult  Completed      Plan:   ***   I have personally reviewed and noted the following in the patient's chart:   . Medical and social history . Use of alcohol, tobacco or illicit drugs  . Current medications and supplements . Functional ability and status . Nutritional status . Physical activity . Advanced directives . List of other physicians . Hospitalizations, surgeries, and ER visits in previous 12 months . Vitals . Screenings to include cognitive, depression, and falls . Referrals and appointments  In addition, I have reviewed and discussed with patient certain preventive protocols, quality metrics, and  best practice recommendations. A written personalized care plan for preventive services as well as general preventive health recommendations were provided to patient.     Shela Nevin, [place]  11/02/2018

## 2018-11-03 ENCOUNTER — Ambulatory Visit: Payer: Medicare HMO | Admitting: *Deleted

## 2018-11-03 ENCOUNTER — Ambulatory Visit (INDEPENDENT_AMBULATORY_CARE_PROVIDER_SITE_OTHER): Payer: Medicare HMO | Admitting: Family Medicine

## 2018-11-03 VITALS — BP 108/62 | HR 83 | Temp 98.3°F | Resp 18 | Wt 157.6 lb

## 2018-11-03 DIAGNOSIS — M810 Age-related osteoporosis without current pathological fracture: Secondary | ICD-10-CM | POA: Diagnosis not present

## 2018-11-03 DIAGNOSIS — Z0000 Encounter for general adult medical examination without abnormal findings: Secondary | ICD-10-CM

## 2018-11-03 DIAGNOSIS — E559 Vitamin D deficiency, unspecified: Secondary | ICD-10-CM

## 2018-11-03 DIAGNOSIS — R739 Hyperglycemia, unspecified: Secondary | ICD-10-CM

## 2018-11-03 DIAGNOSIS — E782 Mixed hyperlipidemia: Secondary | ICD-10-CM

## 2018-11-03 DIAGNOSIS — J020 Streptococcal pharyngitis: Secondary | ICD-10-CM | POA: Diagnosis not present

## 2018-11-03 DIAGNOSIS — E039 Hypothyroidism, unspecified: Secondary | ICD-10-CM

## 2018-11-03 DIAGNOSIS — I10 Essential (primary) hypertension: Secondary | ICD-10-CM

## 2018-11-03 DIAGNOSIS — Z1239 Encounter for other screening for malignant neoplasm of breast: Secondary | ICD-10-CM

## 2018-11-03 LAB — COMPREHENSIVE METABOLIC PANEL
ALT: 52 U/L — ABNORMAL HIGH (ref 0–35)
AST: 24 U/L (ref 0–37)
Albumin: 4.7 g/dL (ref 3.5–5.2)
Alkaline Phosphatase: 131 U/L — ABNORMAL HIGH (ref 39–117)
BUN: 17 mg/dL (ref 6–23)
CO2: 29 mEq/L (ref 19–32)
Calcium: 10 mg/dL (ref 8.4–10.5)
Chloride: 99 mEq/L (ref 96–112)
Creatinine, Ser: 0.91 mg/dL (ref 0.40–1.20)
GFR: 60.86 mL/min (ref >60.00)
Glucose, Bld: 85 mg/dL (ref 70–99)
Potassium: 5.2 mEq/L — ABNORMAL HIGH (ref 3.5–5.1)
Sodium: 135 mEq/L (ref 135–145)
Total Bilirubin: 0.7 mg/dL (ref 0.2–1.2)
Total Protein: 7.5 g/dL (ref 6.0–8.3)

## 2018-11-03 LAB — LIPID PANEL
Cholesterol: 143 mg/dL (ref 0–200)
HDL: 46.2 mg/dL (ref >39.00)
LDL Cholesterol: 64 mg/dL (ref 0–99)
NonHDL: 96.35
Total CHOL/HDL Ratio: 3
Triglycerides: 160 mg/dL — ABNORMAL HIGH (ref 0.0–149.0)
VLDL: 32 mg/dL (ref 0.0–40.0)

## 2018-11-03 LAB — CBC
HEMATOCRIT: 38.7 % (ref 36.0–46.0)
Hemoglobin: 13.3 g/dL (ref 12.0–15.0)
MCHC: 34.4 g/dL (ref 30.0–36.0)
MCV: 91.7 fl (ref 78.0–100.0)
Platelets: 491 10*3/uL — ABNORMAL HIGH (ref 150.0–400.0)
RBC: 4.22 Mil/uL (ref 3.87–5.11)
RDW: 13 % (ref 11.5–15.5)
WBC: 9.9 10*3/uL (ref 4.0–10.5)

## 2018-11-03 LAB — TSH: TSH: 4.48 u[IU]/mL (ref 0.35–4.50)

## 2018-11-03 LAB — VITAMIN D 25 HYDROXY (VIT D DEFICIENCY, FRACTURES): VITD: 38.49 ng/mL (ref 30.00–100.00)

## 2018-11-03 MED ORDER — LEVOTHYROXINE SODIUM 150 MCG PO TABS: 150.0000 ug | ORAL_TABLET | Freq: Every day | ORAL | 1 refills | Status: DC

## 2018-11-03 MED ORDER — LISINOPRIL 10 MG PO TABS: 10.0000 mg | ORAL_TABLET | Freq: Every day | ORAL | 3 refills | Status: DC

## 2018-11-03 MED ORDER — ALENDRONATE SODIUM 70 MG PO TABS: 70.0000 mg | ORAL_TABLET | ORAL | 0 refills | Status: DC

## 2018-11-03 MED ORDER — ATORVASTATIN CALCIUM 10 MG PO TABS: 10.0000 mg | ORAL_TABLET | Freq: Every day | ORAL | 3 refills | Status: DC

## 2018-11-03 NOTE — Assessment & Plan Note (Signed)
On Levothyroxine, continue to monitor 

## 2018-11-03 NOTE — Assessment & Plan Note (Signed)
Supplement and monitor 

## 2018-11-03 NOTE — Assessment & Plan Note (Addendum)
Finishes antibiotic tomorrow. Taking probiotic. Alternating Tylenol and Advil to manage symptoms

## 2018-11-03 NOTE — Patient Instructions (Addendum)
Tylenol ES 500 mg  1-2 up to 3 x a day to a max of 3000 mg in 24 hours   Advil 200 can take 1-2 every 6 hours. Max in 24 hours is 2400 mg (but as we age gets harder on the kidneys, heart and stomach so we try to minimize does   Recommend calcium intake of 1200 to 1500 mg daily, divided into roughly 3 doses. Best source is the diet and a single dairy serving is about 500 mg, a supplement of calcium citrate once or twice daily to balance diet is fine if not getting enough in diet. Also need Vitamin D 2000 IU caps, 1 cap daily if not already taking vitamin D. Also recommend weight baring exercise on hips and upper body to keep bones strong   Preventive Care 65 Years and Older, Female Preventive care refers to lifestyle choices and visits with your health care provider that can promote health and wellness. What does preventive care include?  A yearly physical exam. This is also called an annual well check.  Dental exams once or twice a year.  Routine eye exams. Ask your health care provider how often you should have your eyes checked.  Personal lifestyle choices, including: ? Daily care of your teeth and gums. ? Regular physical activity. ? Eating a healthy diet. ? Avoiding tobacco and drug use. ? Limiting alcohol use. ? Practicing safe sex. ? Taking low-dose aspirin every day. ? Taking vitamin and mineral supplements as recommended by your health care provider. What happens during an annual well check? The services and screenings done by your health care provider during your annual well check will depend on your age, overall health, lifestyle risk factors, and family history of disease. Counseling Your health care provider may ask you questions about your:  Alcohol use.  Tobacco use.  Drug use.  Emotional well-being.  Home and relationship well-being.  Sexual activity.  Eating habits.  History of falls.  Memory and ability to understand (cognition).  Work and work  Statistician.  Reproductive health.  Screening You may have the following tests or measurements:  Height, weight, and BMI.  Blood pressure.  Lipid and cholesterol levels. These may be checked every 5 years, or more frequently if you are over 33 years old.  Skin check.  Lung cancer screening. You may have this screening every year starting at age 57 if you have a 30-pack-year history of smoking and currently smoke or have quit within the past 15 years.  Colorectal cancer screening. All adults should have this screening starting at age 72 and continuing until age 56. You will have tests every 1-10 years, depending on your results and the type of screening test. People at increased risk should start screening at an earlier age. Screening tests may include: ? Guaiac-based fecal occult blood testing. ? Fecal immunochemical test (FIT). ? Stool DNA test. ? Virtual colonoscopy. ? Sigmoidoscopy. During this test, a flexible tube with a tiny camera (sigmoidoscope) is used to examine your rectum and lower colon. The sigmoidoscope is inserted through your anus into your rectum and lower colon. ? Colonoscopy. During this test, a long, thin, flexible tube with a tiny camera (colonoscope) is used to examine your entire colon and rectum.  Hepatitis C blood test.  Hepatitis B blood test.  Sexually transmitted disease (STD) testing.  Diabetes screening. This is done by checking your blood sugar (glucose) after you have not eaten for a while (fasting). You may have this done  every 1-3 years.  Bone density scan. This is done to screen for osteoporosis. You may have this done starting at age 43.  Mammogram. This may be done every 1-2 years. Talk to your health care provider about how often you should have regular mammograms. Talk with your health care provider about your test results, treatment options, and if necessary, the need for more tests. Vaccines Your health care provider may recommend  certain vaccines, such as:  Influenza vaccine. This is recommended every year.  Tetanus, diphtheria, and acellular pertussis (Tdap, Td) vaccine. You may need a Td booster every 10 years.  Varicella vaccine. You may need this if you have not been vaccinated.  Zoster vaccine. You may need this after age 14.  Measles, mumps, and rubella (MMR) vaccine. You may need at least one dose of MMR if you were born in 1957 or later. You may also need a second dose.  Pneumococcal 13-valent conjugate (PCV13) vaccine. One dose is recommended after age 66.  Pneumococcal polysaccharide (PPSV23) vaccine. One dose is recommended after age 26.  Meningococcal vaccine. You may need this if you have certain conditions.  Hepatitis A vaccine. You may need this if you have certain conditions or if you travel or work in places where you may be exposed to hepatitis A.  Hepatitis B vaccine. You may need this if you have certain conditions or if you travel or work in places where you may be exposed to hepatitis B.  Haemophilus influenzae type b (Hib) vaccine. You may need this if you have certain conditions. Talk to your health care provider about which screenings and vaccines you need and how often you need them. This information is not intended to replace advice given to you by your health care provider. Make sure you discuss any questions you have with your health care provider. Document Released: 09/13/2015 Document Revised: 10/07/2017 Document Reviewed: 06/18/2015 Elsevier Interactive Patient Education  2019 [employer] 65 Years and Older, Female Preventive care refers to lifestyle choices and visits with your health care provider that can promote health and wellness. What does preventive care include?  A yearly physical exam. This is also called an annual well check.  Dental exams once or twice a year.  Routine eye exams. Ask your health care provider how often you should have your  eyes checked.  Personal lifestyle choices, including: ? Daily care of your teeth and gums. ? Regular physical activity. ? Eating a healthy diet. ? Avoiding tobacco and drug use. ? Limiting alcohol use. ? Practicing safe sex. ? Taking low-dose aspirin every day. ? Taking vitamin and mineral supplements as recommended by your health care provider. What happens during an annual well check? The services and screenings done by your health care provider during your annual well check will depend on your age, overall health, lifestyle risk factors, and family history of disease. Counseling Your health care provider may ask you questions about your:  Alcohol use.  Tobacco use.  Drug use.  Emotional well-being.  Home and relationship well-being.  Sexual activity.  Eating habits.  History of falls.  Memory and ability to understand (cognition).  Work and work Statistician.  Reproductive health.  Screening You may have the following tests or measurements:  Height, weight, and BMI.  Blood pressure.  Lipid and cholesterol levels. These may be checked every 5 years, or more frequently if you are over 69 years old.  Skin check.  Lung cancer screening. You may have  this screening every year starting at age 65 if you have a 30-pack-year history of smoking and currently smoke or have quit within the past 15 years.  Colorectal cancer screening. All adults should have this screening starting at age 18 and continuing until age 42. You will have tests every 1-10 years, depending on your results and the type of screening test. People at increased risk should start screening at an earlier age. Screening tests may include: ? Guaiac-based fecal occult blood testing. ? Fecal immunochemical test (FIT). ? Stool DNA test. ? Virtual colonoscopy. ? Sigmoidoscopy. During this test, a flexible tube with a tiny camera (sigmoidoscope) is used to examine your rectum and lower colon. The  sigmoidoscope is inserted through your anus into your rectum and lower colon. ? Colonoscopy. During this test, a long, thin, flexible tube with a tiny camera (colonoscope) is used to examine your entire colon and rectum.  Hepatitis C blood test.  Hepatitis B blood test.  Sexually transmitted disease (STD) testing.  Diabetes screening. This is done by checking your blood sugar (glucose) after you have not eaten for a while (fasting). You may have this done every 1-3 years.  Bone density scan. This is done to screen for osteoporosis. You may have this done starting at age 65.  Mammogram. This may be done every 1-2 years. Talk to your health care provider about how often you should have regular mammograms. Talk with your health care provider about your test results, treatment options, and if necessary, the need for more tests. Vaccines Your health care provider may recommend certain vaccines, such as:  Influenza vaccine. This is recommended every year.  Tetanus, diphtheria, and acellular pertussis (Tdap, Td) vaccine. You may need a Td booster every 10 years.  Varicella vaccine. You may need this if you have not been vaccinated.  Zoster vaccine. You may need this after age 1.  Measles, mumps, and rubella (MMR) vaccine. You may need at least one dose of MMR if you were born in 1957 or later. You may also need a second dose.  Pneumococcal 13-valent conjugate (PCV13) vaccine. One dose is recommended after age 16.  Pneumococcal polysaccharide (PPSV23) vaccine. One dose is recommended after age 66.  Meningococcal vaccine. You may need this if you have certain conditions.  Hepatitis A vaccine. You may need this if you have certain conditions or if you travel or work in places where you may be exposed to hepatitis A.  Hepatitis B vaccine. You may need this if you have certain conditions or if you travel or work in places where you may be exposed to hepatitis B.  Haemophilus influenzae  type b (Hib) vaccine. You may need this if you have certain conditions. Talk to your health care provider about which screenings and vaccines you need and how often you need them. This information is not intended to replace advice given to you by your health care provider. Make sure you discuss any questions you have with your health care provider. Document Released: 09/13/2015 Document Revised: 10/07/2017 Document Reviewed: 06/18/2015 Elsevier Interactive Patient Education  2019 [employer].

## 2018-11-03 NOTE — Assessment & Plan Note (Signed)
Encouraged to get adequate exercise, calcium and vitamin d intake 

## 2018-11-03 NOTE — Assessment & Plan Note (Signed)
Tolerating statin, encouraged heart healthy diet, avoid trans fats, minimize simple carbs and saturated fats. Increase exercise as tolerated 

## 2018-11-03 NOTE — Assessment & Plan Note (Signed)
Well controlled, no changes to meds. Encouraged heart healthy diet such as the DASH diet and exercise as tolerated.  °

## 2018-11-03 NOTE — Assessment & Plan Note (Signed)
hgba1c acceptable, minimize simple carbs. Increase exercise as tolerated. Continue current meds 

## 2018-11-03 NOTE — Assessment & Plan Note (Addendum)
Patient encouraged to maintain heart healthy diet, regular exercise, adequate sleep. Consider daily probiotics. Take medications as prescribed. Declines pap for now. Has never had any concerning paps and has no concerns

## 2018-11-04 NOTE — Progress Notes (Signed)
Entered in looking for documentation of shingrix and flu vaccination. Patient's vaccinations not up to date in [place].

## 2018-11-06 NOTE — Progress Notes (Addendum)
Subjective:    Patient ID: Michelle Jacobs, female    DOB: 1946-02-14, 73 y.o.   MRN: [id number]  No chief complaint on file.   HPI Patient is in today for annual preventative exam and follow up on medical concerns such as strep throat she is currently having treatment for. She is tolerating her antibiotic for her strep throat and is slowly feeling better. No nausea, vomit, abdominal pain or fever at present. She does still endorse sore throat, PND and cough previously productive of yellow to green phlegm and now clear. She was doing well before becoming ill. She is doing well with activities of daily living. Is staying active, working full time and trying to maintain a heart healthy diet. Denies CP/palp/SOB/fevers/GI or GU c/o. Taking meds as prescribed  Past Medical History:  Diagnosis Date  . Allergy   . Annual physical exam 10/06/2015  . GERD (gastroesophageal reflux disease)    mild  . History of chicken pox   . Hyperglycemia 05/20/2013  . Hyperlipidemia   . Hypertension   . Medicare annual wellness visit, subsequent 12/02/2011  . Personal history of colonic polyps 2009   Benign colon polyps  . Preventative health care 12/02/2011  . Verrucous skin lesion 11/04/2016    No past surgical history on file.  Family History  Problem Relation Age of Onset  . Cancer Mother        lung  . Hypertension Mother   . Heart disease Father        CHF  . Diabetes Maternal Uncle        type II  . Heart disease Maternal Uncle        s/p CABG  . Cancer Maternal Grandmother 83       breast  . Heart disease Maternal Grandmother        MI, CHF  . Obesity Maternal Grandmother   . Cancer Sister        uterine  . Hypertension Sister   . Hyperlipidemia Brother   . Hypertension Brother   . Cancer Brother        HPV associated oral cancer  . Hearing loss Brother   . Kidney disease Maternal Grandfather        kidney failure  . Cancer Paternal Grandmother   . COPD Paternal Grandfather   .  Diabetes Maternal Aunt        older, type 2    Social History   Socioeconomic History  . Marital status: Married    Spouse name: Not on file  . Number of children: 2  . Years of education: Not on file  . Highest education level: Not on file  Occupational History    Employer: [employer]  Social Needs  . Financial resource strain: Not on file  . Food insecurity:    Worry: Not on file    Inability: Not on file  . Transportation needs:    Medical: Not on file    Non-medical: Not on file  Tobacco Use  . Smoking status: Never Smoker  . Smokeless tobacco: Never Used  Substance and Sexual Activity  . Alcohol use: Yes    Alcohol/week: 10.0 standard drinks    Types: 10 Glasses of wine per week  . Drug use: No  . Sexual activity: Not on file    Comment: lives with husband, no dietary restrictions  Lifestyle  . Physical activity:    Days per week: Not on file  Minutes per session: Not on file  . Stress: Not on file  Relationships  . Social connections:    Talks on phone: Not on file    Gets together: Not on file    Attends religious service: Not on file    Active member of club or organization: Not on file    Attends meetings of clubs or organizations: Not on file    Relationship status: Not on file  . Intimate partner violence:    Fear of current or ex partner: Not on file    Emotionally abused: Not on file    Physically abused: Not on file    Forced sexual activity: Not on file  Other Topics Concern  . Not on file  Social History Narrative   Caffeine use:  2-3 cups coffee daily   Regular exercise:  Walks daily          Outpatient Medications Prior to Visit  Medication Sig Dispense Refill  . aspirin EC 81 MG tablet Take 81 mg by mouth daily.      . cetirizine (ZYRTEC) 10 MG tablet Take 10 mg by mouth daily.    . fluticasone (FLONASE) 50 MCG/ACT nasal spray Place 2 sprays into both nostrils daily. 16 g 1  . Krill Oil 1000 MG CAPS Take 1,000 mg by  mouth daily.    . Multiple Vitamin (MULTIVITAMIN) tablet Take 1 tablet by mouth daily.    . TURMERIC PO Take by mouth.    Marland Kitchen alendronate (FOSAMAX) 70 MG tablet Take 1 tablet (70 mg total) by mouth every 7 (seven) days. Take with a full glass of water on an empty stomach. 24 tablet 0  . amoxicillin (AMOXIL) 875 MG tablet Take 1 tablet (875 mg total) by mouth 2 (two) times daily. Just fill amoxicillin/not augmentin 20 tablet 0  . amoxicillin-clavulanate (AUGMENTIN) 875-125 MG tablet Take 1 tablet by mouth 2 (two) times daily. 20 tablet 0  . atorvastatin (LIPITOR) 10 MG tablet Take 1 tablet (10 mg total) by mouth daily. 90 tablet 3  . benzonatate (TESSALON) 100 MG capsule Take 1 capsule (100 mg total) by mouth 3 (three) times daily as needed for cough. 30 capsule 0  . levothyroxine (SYNTHROID, LEVOTHROID) 150 MCG tablet Take 1 tablet (150 mcg total) by mouth daily before breakfast. 90 tablet 1  . lisinopril (PRINIVIL,ZESTRIL) 10 MG tablet Take 1 tablet (10 mg total) by mouth daily. 90 tablet 3   No facility-administered medications prior to visit.     Allergies  Allergen Reactions  . Codeine Nausea Only  . Iodine Rash  . Mercurochrome [Merbromin] Rash  . Sulfa Antibiotics Rash    Review of Systems  Constitutional: Positive for malaise/fatigue. Negative for chills and fever.  HENT: Positive for congestion, sinus pain and sore throat. Negative for hearing loss.   Eyes: Negative for discharge.  Respiratory: Positive for cough and sputum production. Negative for shortness of breath.   Cardiovascular: Negative for chest pain, palpitations and leg swelling.  Gastrointestinal: Negative for abdominal pain, blood in stool, constipation, diarrhea, heartburn, nausea and vomiting.  Genitourinary: Negative for dysuria, frequency, hematuria and urgency.  Musculoskeletal: Negative for back pain, falls and myalgias.  Skin: Negative for rash.  Neurological: Negative for dizziness, sensory change, loss of  consciousness, weakness and headaches.  Endo/Heme/Allergies: Negative for environmental allergies. Does not bruise/bleed easily.  Psychiatric/Behavioral: Negative for depression and suicidal ideas. The patient is not nervous/anxious and does not have insomnia.  Objective:    Physical Exam Constitutional:      General: She is not in acute distress.    Appearance: She is well-developed.  HENT:     Head: Normocephalic and atraumatic.  Eyes:     Conjunctiva/sclera: Conjunctivae normal.  Neck:     Musculoskeletal: Neck supple.     Thyroid: No thyromegaly.  Cardiovascular:     Rate and Rhythm: Normal rate and regular rhythm.     Heart sounds: Normal heart sounds. No murmur.  Pulmonary:     Effort: Pulmonary effort is normal. No respiratory distress.     Breath sounds: Normal breath sounds.  Abdominal:     General: Bowel sounds are normal. There is no distension.     Palpations: Abdomen is soft. There is no mass.     Tenderness: There is no abdominal tenderness.  Lymphadenopathy:     Cervical: No cervical adenopathy.  Skin:    General: Skin is warm and dry.  Neurological:     Mental Status: She is alert and oriented to person, place, and time.  Psychiatric:        Behavior: Behavior normal.     BP 108/62 (BP Location: Left Arm, Patient Position: Sitting, Cuff Size: Normal)   Pulse 83   Temp 98.3 F (36.8 C) (Oral)   Resp 18   Wt 157 lb 9.6 oz (71.5 kg)   SpO2 94%   BMI 30.78 kg/m  Wt Readings from Last 3 Encounters:  11/03/18 157 lb 9.6 oz (71.5 kg)  10/25/18 160 lb (72.6 kg)  04/25/18 156 lb 3.2 oz (70.9 kg)     Lab Results  Component Value Date   WBC 9.9 11/03/2018   HGB 13.3 11/03/2018   HCT 38.7 11/03/2018   PLT 491.0 (H) 11/03/2018   GLUCOSE 85 11/03/2018   CHOL 143 11/03/2018   TRIG 160.0 (H) 11/03/2018   HDL 46.20 11/03/2018   LDLCALC 64 11/03/2018   ALT 52 (H) 11/03/2018   AST 24 11/03/2018   NA 135 11/03/2018   K 5.2 (H) 11/03/2018   CL  99 11/03/2018   CREATININE 0.91 11/03/2018   BUN 17 11/03/2018   CO2 29 11/03/2018   TSH 4.48 11/03/2018   HGBA1C 5.2 04/25/2018    Lab Results  Component Value Date   TSH 4.48 11/03/2018   Lab Results  Component Value Date   WBC 9.9 11/03/2018   HGB 13.3 11/03/2018   HCT 38.7 11/03/2018   MCV 91.7 11/03/2018   PLT 491.0 (H) 11/03/2018   Lab Results  Component Value Date   NA 135 11/03/2018   K 5.2 (H) 11/03/2018   CO2 29 11/03/2018   GLUCOSE 85 11/03/2018   BUN 17 11/03/2018   CREATININE 0.91 11/03/2018   BILITOT 0.7 11/03/2018   ALKPHOS 131 (H) 11/03/2018   AST 24 11/03/2018   ALT 52 (H) 11/03/2018   PROT 7.5 11/03/2018   ALBUMIN 4.7 11/03/2018   CALCIUM 10.0 11/03/2018   GFR 60.86 11/03/2018   Lab Results  Component Value Date   CHOL 143 11/03/2018   Lab Results  Component Value Date   HDL 46.20 11/03/2018   Lab Results  Component Value Date   LDLCALC 64 11/03/2018   Lab Results  Component Value Date   TRIG 160.0 (H) 11/03/2018   Lab Results  Component Value Date   CHOLHDL 3 11/03/2018   Lab Results  Component Value Date   HGBA1C 5.2 04/25/2018  Assessment & Plan:   Problem List Items Addressed This Visit    Osteoporosis (Chronic)    Encouraged to get adequate exercise, calcium and vitamin d intake      Relevant Medications   alendronate (FOSAMAX) 70 MG tablet   Other Relevant Orders   DG Bone Density   HTN (hypertension)    Well controlled, no changes to meds. Encouraged heart healthy diet such as the DASH diet and exercise as tolerated.       Relevant Medications   lisinopril (PRINIVIL,ZESTRIL) 10 MG tablet   atorvastatin (LIPITOR) 10 MG tablet   Other Relevant Orders   CBC (Completed)   Comprehensive metabolic panel (Completed)   TSH (Completed)   Hyperlipidemia    Tolerating statin, encouraged heart healthy diet, avoid trans fats, minimize simple carbs and saturated fats. Increase exercise as tolerated      Relevant  Medications   lisinopril (PRINIVIL,ZESTRIL) 10 MG tablet   atorvastatin (LIPITOR) 10 MG tablet   Other Relevant Orders   Lipid panel (Completed)   Breast cancer screening - Primary   Relevant Orders   MM 3D SCREEN BREAST BILATERAL   Hypothyroidism    On Levothyroxine, continue to monitor      Relevant Medications   levothyroxine (SYNTHROID, LEVOTHROID) 150 MCG tablet   Hyperglycemia    hgba1c acceptable, minimize simple carbs. Increase exercise as tolerated. Continue current meds.       Annual physical exam    Patient encouraged to maintain heart healthy diet, regular exercise, adequate sleep. Consider daily probiotics. Take medications as prescribed. Declines pap for now. Has never had any concerning paps and has no concerns      Vitamin D deficiency    Supplement and monitor      Relevant Orders   VITAMIN D 25 Hydroxy (Vit-D Deficiency, Fractures) (Completed)   Strep throat    Finishes antibiotic tomorrow. Taking probiotic. Alternating Tylenol and Advil to manage symptoms         I have discontinued Linnell L. Rezendes's amoxicillin-clavulanate, benzonatate, and amoxicillin. I am also having her maintain her aspirin EC, cetirizine, multivitamin, Krill Oil, fluticasone, TURMERIC PO, lisinopril, alendronate, atorvastatin, and levothyroxine.  Meds ordered this encounter  Medications  . lisinopril (PRINIVIL,ZESTRIL) 10 MG tablet    Sig: Take 1 tablet (10 mg total) by mouth daily.    Dispense:  90 tablet    Refill:  3  . alendronate (FOSAMAX) 70 MG tablet    Sig: Take 1 tablet (70 mg total) by mouth every 7 (seven) days. Take with a full glass of water on an empty stomach.    Dispense:  24 tablet    Refill:  0  . atorvastatin (LIPITOR) 10 MG tablet    Sig: Take 1 tablet (10 mg total) by mouth daily.    Dispense:  90 tablet    Refill:  3  . levothyroxine (SYNTHROID, LEVOTHROID) 150 MCG tablet    Sig: Take 1 tablet (150 mcg total) by mouth daily before breakfast.     Dispense:  90 tablet    Refill:  1     Penni Homans, MD

## 2018-11-07 ENCOUNTER — Ambulatory Visit (HOSPITAL_BASED_OUTPATIENT_CLINIC_OR_DEPARTMENT_OTHER)
Admission: RE | Admit: 2018-11-07 | Discharge: 2018-11-07 | Disposition: A | Discharge status: Home / Self Care | Payer: Medicare HMO | Source: Ambulatory Visit | Attending: Family Medicine | Admitting: Family Medicine

## 2018-11-07 DIAGNOSIS — M810 Age-related osteoporosis without current pathological fracture: Secondary | ICD-10-CM | POA: Insufficient documentation

## 2018-11-07 DIAGNOSIS — Z1239 Encounter for other screening for malignant neoplasm of breast: Secondary | ICD-10-CM | POA: Diagnosis present

## 2018-11-07 DIAGNOSIS — Z1231 Encounter for screening mammogram for malignant neoplasm of breast: Secondary | ICD-10-CM | POA: Diagnosis not present

## 2018-11-07 DIAGNOSIS — M85852 Other specified disorders of bone density and structure, left thigh: Secondary | ICD-10-CM | POA: Diagnosis not present

## 2018-11-07 DIAGNOSIS — Z780 Asymptomatic menopausal state: Secondary | ICD-10-CM | POA: Diagnosis not present

## 2018-11-07 DIAGNOSIS — M85832 Other specified disorders of bone density and structure, left forearm: Secondary | ICD-10-CM | POA: Diagnosis not present

## 2018-11-10 NOTE — Progress Notes (Signed)
Subjective:   Michelle Jacobs is a 73 y.o. female who presents for Medicare Annual (Subsequent) preventive examination.  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  Review of Systems: No ROS.  Medicare Wellness Visit. Additional risk factors are reflected in the social history. Cardiac Risk Factors include: advanced age (>58men, >17 women);dyslipidemia;hypertension Sleep patterns: sleeps about 6 hrs. Home Safety/Smoke Alarms: Feels safe in home. Smoke alarms in place.  Lives with husband. [place] w/ full basement. Step over tub w/ grab rails.   Female:        Mammo- 11/08/18      Dexa scan- 11/07/18     CCS- 11/2008    Objective:     Vitals: BP 136/70 (BP Location: Left Arm, Patient Position: Sitting, Cuff Size: Normal)   Pulse 67   Ht 5' (1.524 m)   Wt 158 lb 9.6 oz (71.9 kg)   SpO2 97%   BMI 30.97 kg/m   Body mass index is 30.97 kg/m.  Advanced Directives 11/11/2018 09/25/2016 09/23/2015  Does Patient Have a Medical Advance Directive? Yes No;Yes Yes  Type of Paramedic of [place];Living will [place];Living will [place];Living will  Does patient want to make changes to medical advance directive? Yes (MAU/Ambulatory/Procedural Areas - Information given) Yes (MAU/Ambulatory/Procedural Areas - Information given) No - Patient declined  Copy of [place] in Chart? Yes - validated most recent copy scanned in chart (See row information) Yes No - copy requested  Would patient like information on creating a medical advance directive? - No - Patient declined -    Tobacco Social History   Tobacco Use  Smoking Status Never Smoker  Smokeless Tobacco Never Used     Counseling given: Not Answered   Clinical Intake: Pain : No/denies pain     Past Medical History:  Diagnosis Date  . Allergy   . Annual physical exam 10/06/2015  . GERD (gastroesophageal reflux disease)    mild  . History  of chicken pox   . Hyperglycemia 05/20/2013  . Hyperlipidemia   . Hypertension   . Medicare annual wellness visit, subsequent 12/02/2011  . Personal history of colonic polyps 2009   Benign colon polyps  . Preventative health care 12/02/2011  . Verrucous skin lesion 11/04/2016   History reviewed. No pertinent surgical history. Family History  Problem Relation Age of Onset  . Cancer Mother        lung  . Hypertension Mother   . Heart disease Father        CHF  . Diabetes Maternal Uncle        type II  . Heart disease Maternal Uncle        s/p CABG  . Cancer Maternal Grandmother 37       breast  . Heart disease Maternal Grandmother        MI, CHF  . Obesity Maternal Grandmother   . Cancer Sister        uterine  . Hypertension Sister   . Hyperlipidemia Brother   . Hypertension Brother   . Cancer Brother        HPV associated oral cancer  . Hearing loss Brother   . Kidney disease Maternal Grandfather        kidney failure  . Cancer Paternal Grandmother   . COPD Paternal Grandfather   . Diabetes Maternal Aunt        older, type 2  Social History   Socioeconomic History  . Marital status: Married    Spouse name: Not on file  . Number of children: 2  . Years of education: Not on file  . Highest education level: Not on file  Occupational History    Employer: [employer]  Social Needs  . Financial resource strain: Not on file  . Food insecurity:    Worry: Not on file    Inability: Not on file  . Transportation needs:    Medical: Not on file    Non-medical: Not on file  Tobacco Use  . Smoking status: Never Smoker  . Smokeless tobacco: Never Used  Substance and Sexual Activity  . Alcohol use: Yes    Alcohol/week: 10.0 standard drinks    Types: 10 Glasses of wine per week  . Drug use: No  . Sexual activity: Not on file    Comment: lives with husband, no dietary restrictions  Lifestyle  . Physical activity:    Days per week: Not on file    Minutes per  session: Not on file  . Stress: Not on file  Relationships  . Social connections:    Talks on phone: Not on file    Gets together: Not on file    Attends religious service: Not on file    Active member of club or organization: Not on file    Attends meetings of clubs or organizations: Not on file    Relationship status: Not on file  Other Topics Concern  . Not on file  Social History Narrative   Caffeine use:  2-3 cups coffee daily   Regular exercise:  Walks daily          Outpatient Encounter Medications as of 11/11/2018  Medication Sig  . alendronate (FOSAMAX) 70 MG tablet Take 1 tablet (70 mg total) by mouth every 7 (seven) days. Take with a full glass of water on an empty stomach.  Marland Kitchen atorvastatin (LIPITOR) 10 MG tablet Take 1 tablet (10 mg total) by mouth daily.  . cetirizine (ZYRTEC) 10 MG tablet Take 10 mg by mouth daily.  . ergocalciferol (VITAMIN D2) 1.25 MG (50000 UT) capsule Take 5,000 Units by mouth 4 (four) times a week.  . fluticasone (FLONASE) 50 MCG/ACT nasal spray Place 2 sprays into both nostrils daily.  Marland Kitchen FOLIC ACID PO Take by mouth.  Javier Docker Oil 1000 MG CAPS Take 1,000 mg by mouth daily.  Marland Kitchen levothyroxine (SYNTHROID, LEVOTHROID) 150 MCG tablet Take 1 tablet (150 mcg total) by mouth daily before breakfast.  . lisinopril (PRINIVIL,ZESTRIL) 10 MG tablet Take 1 tablet (10 mg total) by mouth daily.  . Multiple Vitamin (MULTIVITAMIN) tablet Take 1 tablet by mouth daily.  . TURMERIC PO Take by mouth.  . [DISCONTINUED] aspirin EC 81 MG tablet Take 81 mg by mouth daily.     No facility-administered encounter medications on file as of 11/11/2018.     Activities of Daily Living In your present state of health, do you have any difficulty performing the following activities: 11/11/2018  Hearing? N  Vision? N  Difficulty concentrating or making decisions? N  Walking or climbing stairs? N  Dressing or bathing? N  Doing errands, shopping? N  Preparing Food and eating ? N   Using the Toilet? N  In the past six months, have you accidently leaked urine? N  Do you have problems with loss of bowel control? N  Managing your Medications? N  Managing your Finances? N  Housekeeping or managing your Housekeeping? N  Some recent data might be hidden    Patient Care Team: Mosie Lukes, MD as PCP - General (Family Medicine)    Assessment:   This is a routine wellness examination for [facility]. Physical assessment deferred to PCP.  Exercise Activities and Dietary recommendations Current Exercise Habits: Home exercise routine, Type of exercise: walking;strength training/weights, Time (Minutes): 45, Frequency (Times/Week): 4, Weekly Exercise (Minutes/Week): 180, Intensity: Mild, Exercise limited by: None identified   Diet (meal preparation, eat out, water intake, caffeinated beverages, dairy products, fruits and vegetables): in general, a "healthy" diet  , well balanced, on average, 3 meals per day Breakfast: yogurt and fruit Lunch: salad or left overs Dinner: meat and vegetables ( a lot of greens) Avoids sugar. Drinks a lot of water.  Goals    . Continue to eat healthy and exercise    . increase sleep        Fall Risk Fall Risk  11/11/2018 09/25/2016 11/14/2015 09/23/2015 01/23/2014  Falls in the past year? 0 No Yes Yes No  Number falls in past yr: - - 1 1 -  Comment - - - patient fell holding too many items and tried to catch one. -  Injury with Fall? - - No No -  Risk for fall due to : - - Impaired balance/gait - -  Follow up - - Falls prevention discussed - -    Depression Screen PHQ 2/9 Scores 11/11/2018 09/25/2016 11/14/2015 09/23/2015  PHQ - 2 Score 0 0 0 0  Exception Documentation - - Patient refusal -     Cognitive Function Ad8 score reviewed for issues:  Issues making decisions:no  Less interest in hobbies / activities:no  Repeats questions, stories (family complaining):no  Trouble using ordinary gadgets (microwave, computer, phone):no   Forgets the month or year: no  Mismanaging finances: no  Remembering appts:no  Daily problems with thinking and/or memory:no Ad8 score is=0     MMSE - Mini Mental State Exam 09/25/2016  Orientation to time 5  Orientation to Place 5  Registration 3  Attention/ Calculation 5  Recall 3  Language- name 2 objects 2  Language- repeat 1  Language- follow 3 step command 3  Language- read & follow direction 1  Write a sentence 1  Copy design 1  Total score 30        Immunization History  Administered Date(s) Administered  . Influenza Split 05/30/2012  . Influenza, High Dose Seasonal PF 07/20/2014, 09/25/2016, 05/25/2017  . Influenza,inj,Quad PF,6+ Mos 05/15/2013, 09/23/2015  . Pneumococcal Conjugate-13 07/30/2014  . Pneumococcal Polysaccharide-23 09/23/2015  . Tdap 09/03/2011  . Zoster 03/28/2013  . Zoster Recombinat (Shingrix) 06/03/2018   Screening Tests Health Maintenance  Topic Date Due  . INFLUENZA VACCINE  03/31/2018  . COLONOSCOPY  12/20/2018  . MAMMOGRAM  11/06/2020  . TETANUS/TDAP  09/02/2021  . DEXA SCAN  Completed  . Hepatitis C Screening  Completed  . PNA vac Low Risk Adult  Completed    Plan:    Please schedule your next medicare wellness visit with me in 1 yr.  Continue to eat heart healthy diet (full of fruits, vegetables, whole grains, lean protein, water--limit salt, fat, and sugar intake) and increase physical activity as tolerated.  Continue doing brain stimulating activities (puzzles, reading, adult coloring books, staying active) to keep memory sharp.   Bring a copy of your living will and/or healthcare power of attorney to your next office visit.   I have  personally reviewed and noted the following in the patient's chart:   . Medical and social history . Use of alcohol, tobacco or illicit drugs  . Current medications and supplements . Functional ability and status . Nutritional status . Physical activity . Advanced directives . List  of other physicians . Hospitalizations, surgeries, and ER visits in previous 12 months . Vitals . Screenings to include cognitive, depression, and falls . Referrals and appointments  In addition, I have reviewed and discussed with patient certain preventive protocols, quality metrics, and best practice recommendations. A written personalized care plan for preventive services as well as general preventive health recommendations were provided to patient.     Shela Nevin, [place]  11/11/2018

## 2018-11-11 ENCOUNTER — Ambulatory Visit (INDEPENDENT_AMBULATORY_CARE_PROVIDER_SITE_OTHER): Payer: Medicare HMO | Admitting: *Deleted

## 2018-11-11 ENCOUNTER — Other Ambulatory Visit: Payer: Self-pay

## 2018-11-11 ENCOUNTER — Encounter: Payer: Self-pay | Admitting: *Deleted

## 2018-11-11 VITALS — BP 136/70 | HR 67 | Ht 60.0 in | Wt 158.6 lb

## 2018-11-11 DIAGNOSIS — Z0000 Encounter for general adult medical examination without abnormal findings: Secondary | ICD-10-CM

## 2018-11-11 NOTE — Patient Instructions (Signed)
Please schedule your next medicare wellness visit with me in 1 yr.  Continue to eat heart healthy diet (full of fruits, vegetables, whole grains, lean protein, water--limit salt, fat, and sugar intake) and increase physical activity as tolerated.  Continue doing brain stimulating activities (puzzles, reading, adult coloring books, staying active) to keep memory sharp.   Bring a copy of your living will and/or healthcare power of attorney to your next office visit.   Michelle Jacobs , Thank you for taking time to come for your Medicare Wellness Visit. I appreciate your ongoing commitment to your health goals. Please review the following plan we discussed and let me know if I can assist you in the future.   These are the goals we discussed: Goals    . Continue to eat healthy and exercise    . increase sleep        This is a list of the screening recommended for you and due dates:  Health Maintenance  Topic Date Due  . Flu Shot  03/31/2018  . Colon Cancer Screening  12/20/2018  . Mammogram  11/06/2020  . Tetanus Vaccine  09/02/2021  . DEXA scan (bone density measurement)  Completed  .  Hepatitis C: One time screening is recommended by Center for Disease Control  (CDC) for  adults born from [birth year] through 1964.   Completed  . Pneumonia vaccines  Completed    Health Maintenance After Age 55 After age 54, you are at a higher risk for certain long-term diseases and infections as well as injuries from falls. Falls are a major cause of broken bones and head injuries in people who are older than age 46. Getting regular preventive care can help to keep you healthy and well. Preventive care includes getting regular testing and making lifestyle changes as recommended by your health care provider. Talk with your health care provider about:  Which screenings and tests you should have. A screening is a test that checks for a disease when you have no symptoms.  A diet and exercise plan that is right  for you. What should I know about screenings and tests to prevent falls? Screening and testing are the best ways to find a health problem early. Early diagnosis and treatment give you the best chance of managing medical conditions that are common after age 50. Certain conditions and lifestyle choices may make you more likely to have a fall. Your health care provider may recommend:  Regular vision checks. Poor vision and conditions such as cataracts can make you more likely to have a fall. If you wear glasses, make sure to get your prescription updated if your vision changes.  Medicine review. Work with your health care provider to regularly review all of the medicines you are taking, including over-the-counter medicines. Ask your health care provider about any side effects that may make you more likely to have a fall. Tell your health care provider if any medicines that you take make you feel dizzy or sleepy.  Osteoporosis screening. Osteoporosis is a condition that causes the bones to get weaker. This can make the bones weak and cause them to break more easily.  Blood pressure screening. Blood pressure changes and medicines to control blood pressure can make you feel dizzy.  Strength and balance checks. Your health care provider may recommend certain tests to check your strength and balance while standing, walking, or changing positions.  Foot health exam. Foot pain and numbness, as well as not wearing proper footwear, can  make you more likely to have a fall.  Depression screening. You may be more likely to have a fall if you have a fear of falling, feel emotionally low, or feel unable to do activities that you used to do.  Alcohol use screening. Using too much alcohol can affect your balance and may make you more likely to have a fall. What actions can I take to lower my risk of falls? General instructions  Talk with your health care provider about your risks for falling. Tell your health  care provider if: ? You fall. Be sure to tell your health care provider about all falls, even ones that seem minor. ? You feel dizzy, sleepy, or off-balance.  Take over-the-counter and prescription medicines only as told by your health care provider. These include any supplements.  Eat a healthy diet and maintain a healthy weight. A healthy diet includes low-fat dairy products, low-fat (lean) meats, and fiber from whole grains, beans, and lots of fruits and vegetables. Home safety  Remove any tripping hazards, such as rugs, cords, and clutter.  Install safety equipment such as grab bars in bathrooms and safety rails on stairs.  Keep rooms and walkways well-lit. Activity   Follow a regular exercise program to stay fit. This will help you maintain your balance. Ask your health care provider what types of exercise are appropriate for you.  If you need a cane or walker, use it as recommended by your health care provider.  Wear supportive shoes that have nonskid soles. Lifestyle  Do not drink alcohol if your health care provider tells you not to drink.  If you drink alcohol, limit how much you have: ? 0-1 drink a day for women. ? 0-2 drinks a day for men.  Be aware of how much alcohol is in your drink. In the [place] one drink equals one typical bottle of beer (12 oz), one-half glass of wine (5 oz), or one shot of hard liquor (1 oz).  Do not use any products that contain nicotine or tobacco, such as cigarettes and e-cigarettes. If you need help quitting, ask your health care provider. Summary  Having a healthy lifestyle and getting preventive care can help to protect your health and wellness after age 23.  Screening and testing are the best way to find a health problem early and help you avoid having a fall. Early diagnosis and treatment give you the best chance for managing medical conditions that are more common for people who are older than age 5.  Falls are a major cause of  broken bones and head injuries in people who are older than age 34. Take precautions to prevent a fall at home.  Work with your health care provider to learn what changes you can make to improve your health and wellness and to prevent falls. This information is not intended to replace advice given to you by your health care provider. Make sure you discuss any questions you have with your health care provider. Document Released: 06/30/2017 Document Revised: 06/30/2017 Document Reviewed: 06/30/2017 Elsevier Interactive Patient Education  2019 [employer].

## 2018-11-14 ENCOUNTER — Telehealth: Payer: Self-pay | Admitting: *Deleted

## 2018-11-14 NOTE — Telephone Encounter (Signed)
Received Colonoscopy Results from [facility]; forwarded to provider/SLS 03/16

## 2018-11-17 NOTE — Addendum Note (Signed)
Addended by: Vernie Shanks E on: 11/17/2018 11:04 AM   Modules accepted: Orders

## 2018-11-18 ENCOUNTER — Encounter: Payer: Self-pay | Admitting: Family Medicine

## 2018-11-21 NOTE — Progress Notes (Signed)
RN note reviewed and agree.  Nikholas Geffre S O'Sullivan NP 

## 2019-03-08 IMAGING — DX DG KNEE COMPLETE 4+V*L*
4 series · 4 of 4 positions shown · non-contrast
Comparison: None.

CLINICAL DATA: Left knee pain and swelling.  Injury 2 weeks ago.

EXAM:
LEFT KNEE - COMPLETE 4+ VIEW

[knee ap]
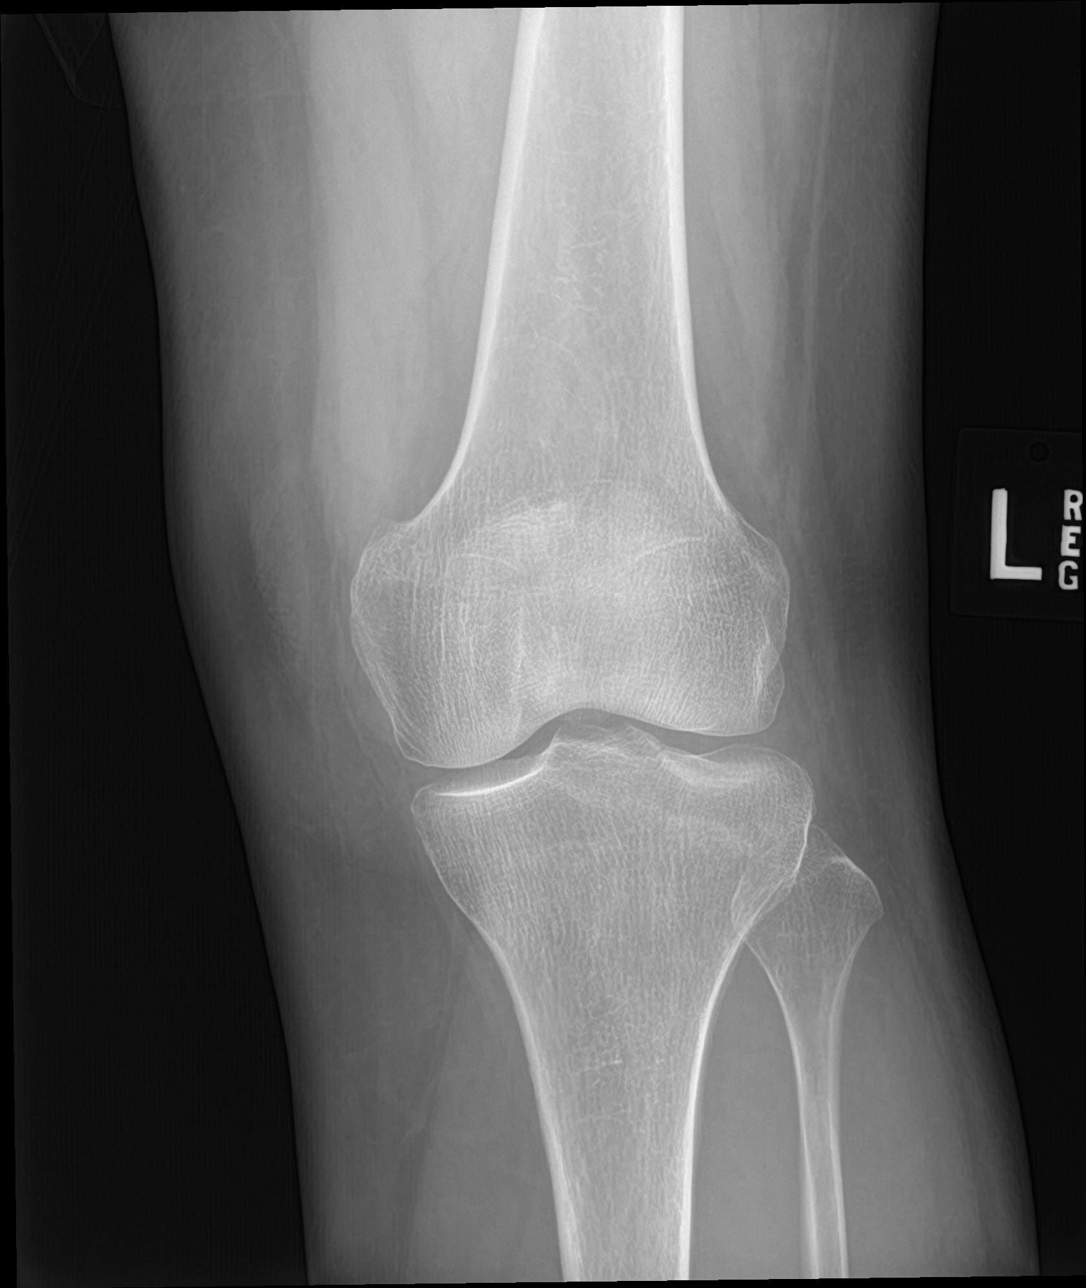

[tunnel]
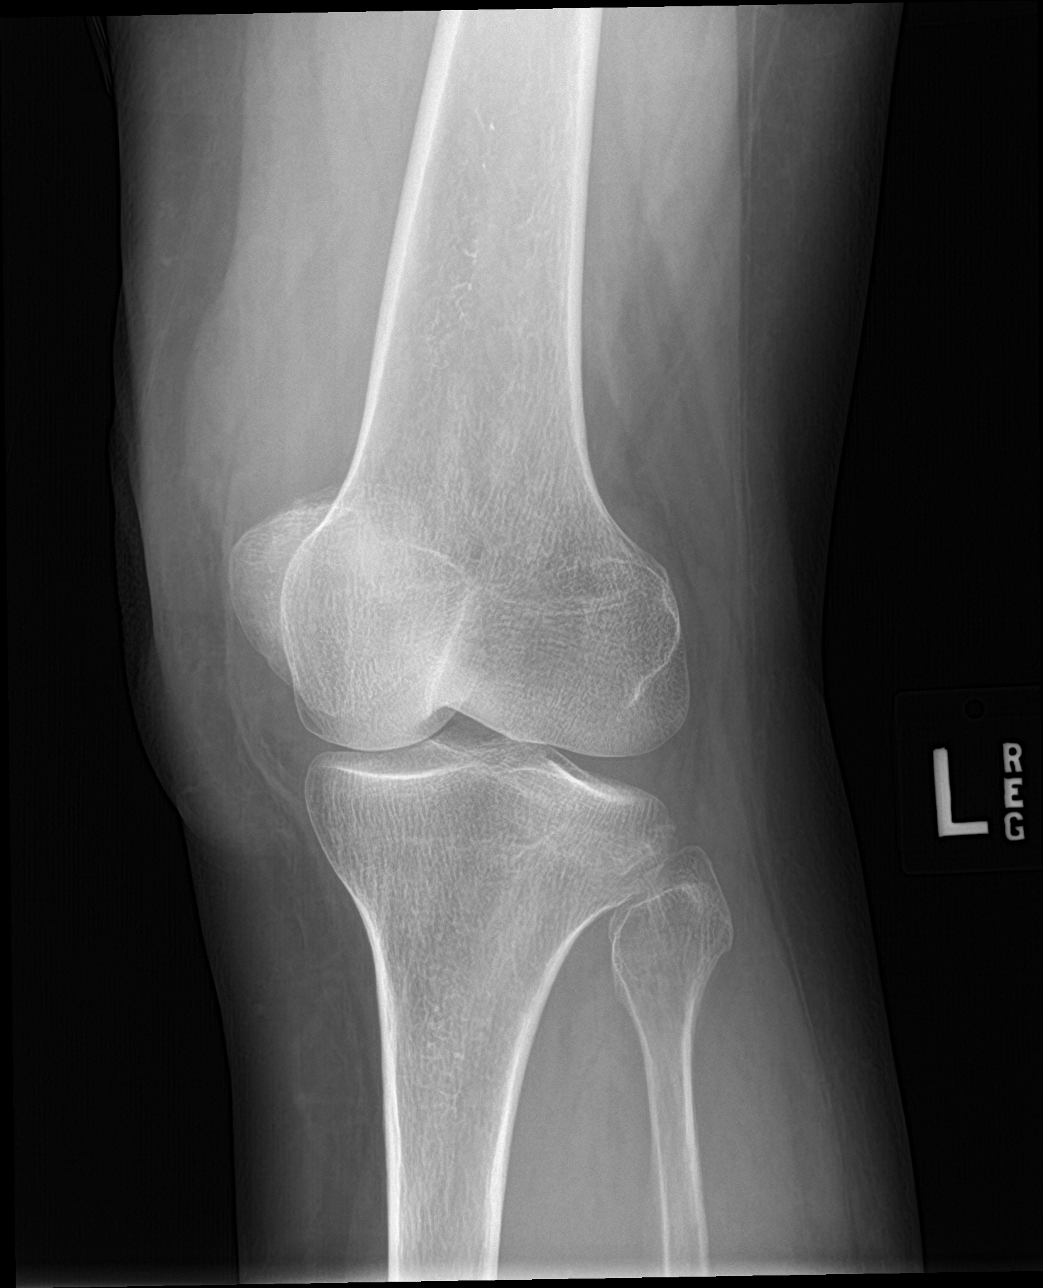

[knee obl (1 of 2)]
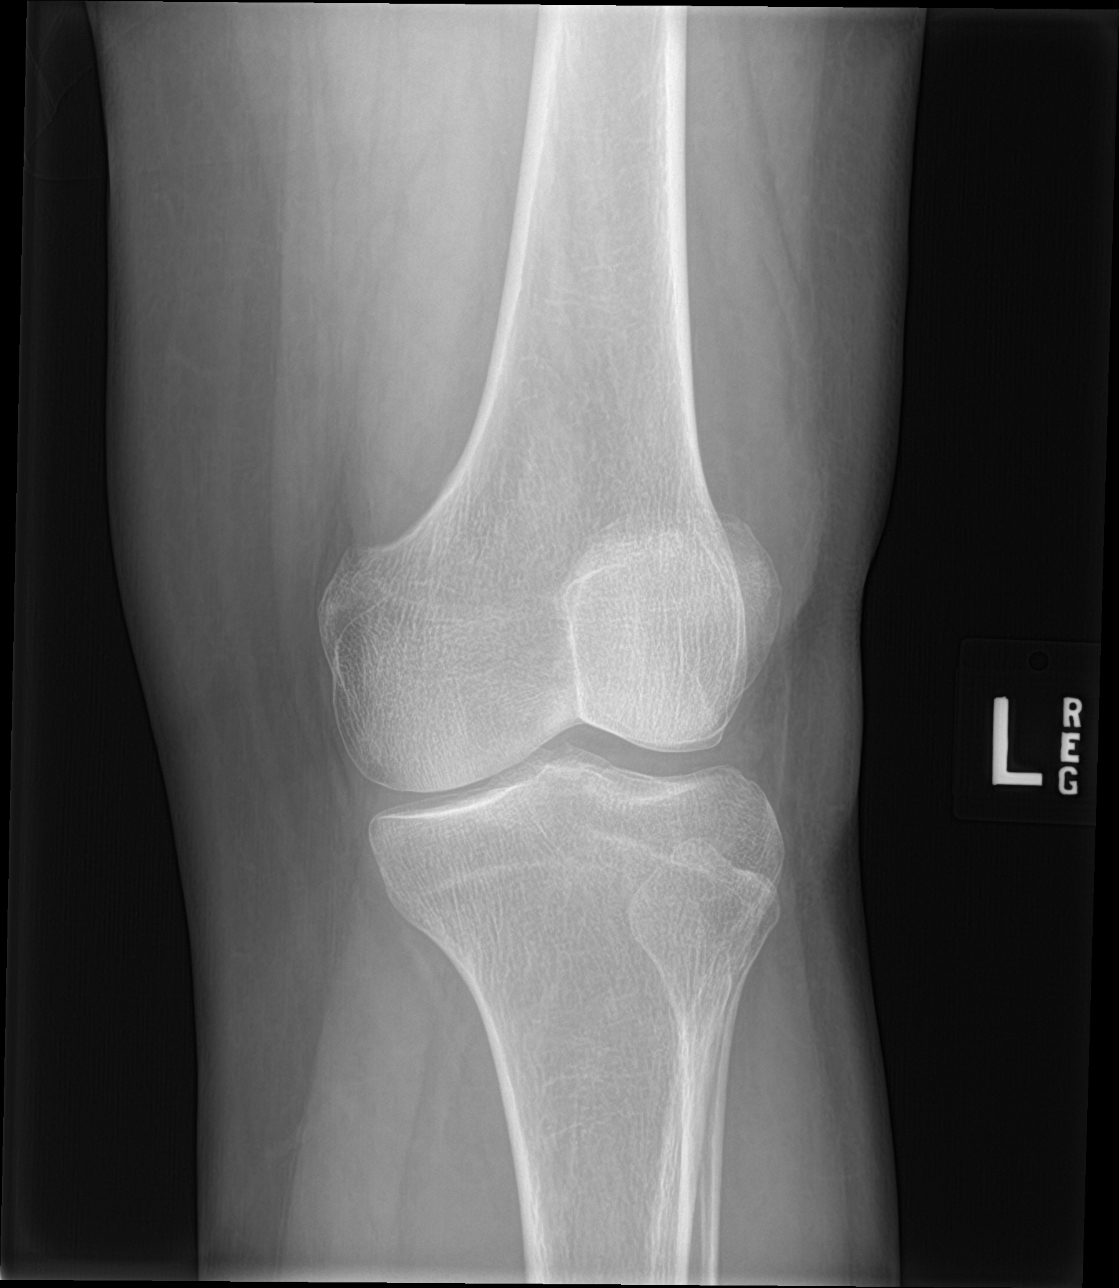

[knee obl (2 of 2)]
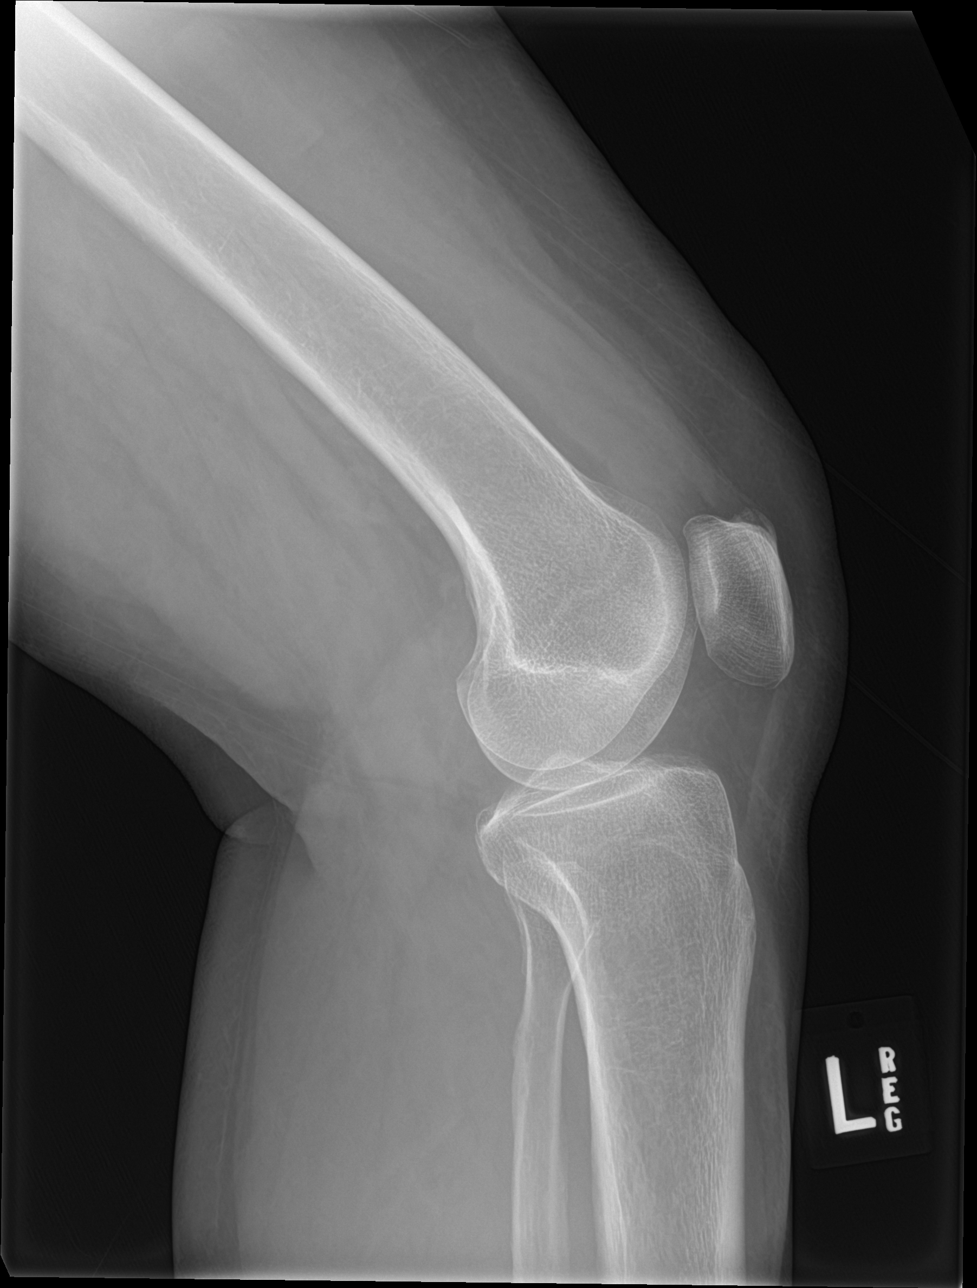

[4 of 4 positions shown; findings below may reference images not displayed]

FINDINGS: There is a moderate joint effusion. Joint spaces are maintained. No
fracture, subluxation or dislocation.
IMPRESSION: Moderate joint effusion.  No acute bony abnormality.

## 2019-05-09 ENCOUNTER — Ambulatory Visit: Payer: Medicare HMO | Admitting: Family Medicine

## 2019-05-12 ENCOUNTER — Other Ambulatory Visit: Payer: Self-pay

## 2019-05-12 ENCOUNTER — Ambulatory Visit (INDEPENDENT_AMBULATORY_CARE_PROVIDER_SITE_OTHER): Payer: Medicare HMO | Admitting: Family Medicine

## 2019-05-12 ENCOUNTER — Encounter: Payer: Self-pay | Admitting: Family Medicine

## 2019-05-12 DIAGNOSIS — E559 Vitamin D deficiency, unspecified: Secondary | ICD-10-CM

## 2019-05-12 DIAGNOSIS — E782 Mixed hyperlipidemia: Secondary | ICD-10-CM

## 2019-05-12 DIAGNOSIS — M810 Age-related osteoporosis without current pathological fracture: Secondary | ICD-10-CM | POA: Diagnosis not present

## 2019-05-12 DIAGNOSIS — R739 Hyperglycemia, unspecified: Secondary | ICD-10-CM

## 2019-05-12 DIAGNOSIS — I10 Essential (primary) hypertension: Secondary | ICD-10-CM

## 2019-05-12 DIAGNOSIS — E039 Hypothyroidism, unspecified: Secondary | ICD-10-CM | POA: Diagnosis not present

## 2019-05-12 NOTE — Assessment & Plan Note (Signed)
hgba1c acceptable, minimize simple carbs. Increase exercise as tolerated.  

## 2019-05-12 NOTE — Assessment & Plan Note (Signed)
Check vitals well, no changes to meds. Encouraged heart healthy diet such as the DASH diet and exercise as tolerated.

## 2019-05-14 NOTE — Assessment & Plan Note (Signed)
On Levothyroxine, continue to monitor 

## 2019-05-14 NOTE — Assessment & Plan Note (Signed)
Has been on Fosamax for a long time and agrees to try Prolia once her lab work is back. Encouraged to get adequate exercise, calcium and vitamin d intake

## 2019-05-14 NOTE — Assessment & Plan Note (Signed)
Encouraged heart healthy diet, increase exercise, avoid trans fats, consider a krill oil cap daily, tolerating Atorvastatin 

## 2019-05-14 NOTE — Progress Notes (Signed)
Subjective:    Patient ID: Michelle Jacobs, female    DOB: July 30, 1946, 73 y.o.   MRN: [id number]  Chief Complaint  Patient presents with  . Follow-up    routine 6 month f/u, med refills     HPI Patient is in today for follow up on chronic medical concerns including hyperglycemia, hyperlipidemia, hypertension and more. She has been on Fosamax for years for her osteoporosis and her recent Dexa scan confirms her osteoporosis is still present. No polyuria or polydipsia. Denies CP/palp/SOB/HA/congestion/fevers/GI or GU c/o. Taking meds as prescribed  Past Medical History:  Diagnosis Date  . Allergy   . Annual physical exam 10/06/2015  . GERD (gastroesophageal reflux disease)    mild  . History of chicken pox   . Hyperglycemia 05/20/2013  . Hyperlipidemia   . Hypertension   . Medicare annual wellness visit, subsequent 12/02/2011  . Personal history of colonic polyps 2009   Benign colon polyps  . Preventative health care 12/02/2011  . Verrucous skin lesion 11/04/2016    History reviewed. No pertinent surgical history.  Family History  Problem Relation Age of Onset  . Cancer Mother        lung  . Hypertension Mother   . Heart disease Father        CHF  . Diabetes Maternal Uncle        type II  . Heart disease Maternal Uncle        s/p CABG  . Cancer Maternal Grandmother 32       breast  . Heart disease Maternal Grandmother        MI, CHF  . Obesity Maternal Grandmother   . Cancer Sister        uterine  . Hypertension Sister   . Hyperlipidemia Brother   . Hypertension Brother   . Cancer Brother        HPV associated oral cancer  . Hearing loss Brother   . Kidney disease Maternal Grandfather        kidney failure  . Cancer Paternal Grandmother   . COPD Paternal Grandfather   . Diabetes Maternal Aunt        older, type 2    Social History   Socioeconomic History  . Marital status: Married    Spouse name: Not on file  . Number of children: 2  . Years of  education: Not on file  . Highest education level: Not on file  Occupational History    Employer: [employer]  Social Needs  . Financial resource strain: Not on file  . Food insecurity    Worry: Not on file    Inability: Not on file  . Transportation needs    Medical: Not on file    Non-medical: Not on file  Tobacco Use  . Smoking status: Never Smoker  . Smokeless tobacco: Never Used  Substance and Sexual Activity  . Alcohol use: Yes    Alcohol/week: 10.0 standard drinks    Types: 10 Glasses of wine per week  . Drug use: No  . Sexual activity: Not on file    Comment: lives with husband, no dietary restrictions  Lifestyle  . Physical activity    Days per week: Not on file    Minutes per session: Not on file  . Stress: Not on file  Relationships  . Social Herbalist on phone: Not on file    Gets together: Not on file    Attends  religious service: Not on file    Active member of club or organization: Not on file    Attends meetings of clubs or organizations: Not on file    Relationship status: Not on file  . Intimate partner violence    Fear of current or ex partner: Not on file    Emotionally abused: Not on file    Physically abused: Not on file    Forced sexual activity: Not on file  Other Topics Concern  . Not on file  Social History Narrative   Caffeine use:  2-3 cups coffee daily   Regular exercise:  Walks daily          Outpatient Medications Prior to Visit  Medication Sig Dispense Refill  . alendronate (FOSAMAX) 70 MG tablet Take 1 tablet (70 mg total) by mouth every 7 (seven) days. Take with a full glass of water on an empty stomach. 24 tablet 0  . atorvastatin (LIPITOR) 10 MG tablet Take 1 tablet (10 mg total) by mouth daily. 90 tablet 3  . cetirizine (ZYRTEC) 10 MG tablet Take 10 mg by mouth daily.    . ergocalciferol (VITAMIN D2) 1.25 MG (50000 UT) capsule Take 5,000 Units by mouth 4 (four) times a week.    Marland Kitchen FOLIC ACID PO Take by  mouth.    . levothyroxine (SYNTHROID, LEVOTHROID) 150 MCG tablet Take 1 tablet (150 mcg total) by mouth daily before breakfast. 90 tablet 1  . lisinopril (PRINIVIL,ZESTRIL) 10 MG tablet Take 1 tablet (10 mg total) by mouth daily. 90 tablet 3  . fluticasone (FLONASE) 50 MCG/ACT nasal spray Place 2 sprays into both nostrils daily. (Patient not taking: Reported on 05/12/2019) 16 g 1  . Krill Oil 1000 MG CAPS Take 1,000 mg by mouth daily.    . Multiple Vitamin (MULTIVITAMIN) tablet Take 1 tablet by mouth daily.    . TURMERIC PO Take by mouth.     No facility-administered medications prior to visit.     Allergies  Allergen Reactions  . Codeine Nausea Only  . Iodine Rash  . Mercurochrome [Merbromin] Rash  . Sulfa Antibiotics Rash    Review of Systems  Constitutional: Negative for fever and malaise/fatigue.  HENT: Negative for congestion.   Eyes: Negative for blurred vision.  Respiratory: Negative for shortness of breath.   Cardiovascular: Negative for chest pain, palpitations and leg swelling.  Gastrointestinal: Negative for abdominal pain, blood in stool and nausea.  Genitourinary: Negative for dysuria and frequency.  Musculoskeletal: Negative for falls.  Skin: Negative for rash.  Neurological: Negative for dizziness, loss of consciousness and headaches.  Endo/Heme/Allergies: Negative for environmental allergies.  Psychiatric/Behavioral: Negative for depression. The patient is not nervous/anxious.        Objective:    Physical Exam unable to obtain via phone visit.   There were no vitals taken for this visit. Wt Readings from Last 3 Encounters:  11/11/18 158 lb 9.6 oz (71.9 kg)  11/03/18 157 lb 9.6 oz (71.5 kg)  10/25/18 160 lb (72.6 kg)    Diabetic Foot Exam - Simple   No data filed     Lab Results  Component Value Date   WBC 9.9 11/03/2018   HGB 13.3 11/03/2018   HCT 38.7 11/03/2018   PLT 491.0 (H) 11/03/2018   GLUCOSE 85 11/03/2018   CHOL 143 11/03/2018   TRIG  160.0 (H) 11/03/2018   HDL 46.20 11/03/2018   LDLCALC 64 11/03/2018   ALT 52 (H) 11/03/2018   AST 24  11/03/2018   NA 135 11/03/2018   K 5.2 (H) 11/03/2018   CL 99 11/03/2018   CREATININE 0.91 11/03/2018   BUN 17 11/03/2018   CO2 29 11/03/2018   TSH 4.48 11/03/2018   HGBA1C 5.2 04/25/2018    Lab Results  Component Value Date   TSH 4.48 11/03/2018   Lab Results  Component Value Date   WBC 9.9 11/03/2018   HGB 13.3 11/03/2018   HCT 38.7 11/03/2018   MCV 91.7 11/03/2018   PLT 491.0 (H) 11/03/2018   Lab Results  Component Value Date   NA 135 11/03/2018   K 5.2 (H) 11/03/2018   CO2 29 11/03/2018   GLUCOSE 85 11/03/2018   BUN 17 11/03/2018   CREATININE 0.91 11/03/2018   BILITOT 0.7 11/03/2018   ALKPHOS 131 (H) 11/03/2018   AST 24 11/03/2018   ALT 52 (H) 11/03/2018   PROT 7.5 11/03/2018   ALBUMIN 4.7 11/03/2018   CALCIUM 10.0 11/03/2018   GFR 60.86 11/03/2018   Lab Results  Component Value Date   CHOL 143 11/03/2018   Lab Results  Component Value Date   HDL 46.20 11/03/2018   Lab Results  Component Value Date   LDLCALC 64 11/03/2018   Lab Results  Component Value Date   TRIG 160.0 (H) 11/03/2018   Lab Results  Component Value Date   CHOLHDL 3 11/03/2018   Lab Results  Component Value Date   HGBA1C 5.2 04/25/2018       Assessment & Plan:   Problem List Items Addressed This Visit    Osteoporosis (Chronic)    Has been on Fosamax for a long time and agrees to try Prolia once her lab work is back. Encouraged to get adequate exercise, calcium and vitamin d intake      HTN (hypertension)    Check vitals well, no changes to meds. Encouraged heart healthy diet such as the DASH diet and exercise as tolerated.       Hyperlipidemia    Encouraged heart healthy diet, increase exercise, avoid trans fats, consider a krill oil cap daily, tolerating Atorvastatin      Hypothyroidism    On Levothyroxine, continue to monitor      Hyperglycemia    hgba1c  acceptable, minimize simple carbs. Increase exercise as tolerated.       Vitamin D deficiency    Supplement and monitor         I am having Mayan L. Seelbach maintain her cetirizine, multivitamin, Krill Oil, fluticasone, TURMERIC PO, lisinopril, alendronate, atorvastatin, levothyroxine, ergocalciferol, and FOLIC ACID PO.  No orders of the defined types were placed in this encounter.    Penni Homans, MD

## 2019-05-14 NOTE — Assessment & Plan Note (Signed)
Supplement and monitor 

## 2019-05-22 ENCOUNTER — Other Ambulatory Visit: Payer: Self-pay

## 2019-05-22 ENCOUNTER — Other Ambulatory Visit (INDEPENDENT_AMBULATORY_CARE_PROVIDER_SITE_OTHER): Payer: Medicare HMO

## 2019-05-22 DIAGNOSIS — E782 Mixed hyperlipidemia: Secondary | ICD-10-CM

## 2019-05-22 DIAGNOSIS — E559 Vitamin D deficiency, unspecified: Secondary | ICD-10-CM | POA: Diagnosis not present

## 2019-05-22 DIAGNOSIS — I10 Essential (primary) hypertension: Secondary | ICD-10-CM | POA: Diagnosis not present

## 2019-05-22 DIAGNOSIS — E039 Hypothyroidism, unspecified: Secondary | ICD-10-CM | POA: Diagnosis not present

## 2019-05-22 DIAGNOSIS — R739 Hyperglycemia, unspecified: Secondary | ICD-10-CM | POA: Diagnosis not present

## 2019-05-22 LAB — CBC WITH DIFFERENTIAL/PLATELET
Basophils Absolute: 0.1 10*3/uL (ref 0.0–0.1)
Basophils Relative: 0.9 % (ref 0.0–3.0)
Eosinophils Absolute: 0.2 10*3/uL (ref 0.0–0.7)
Eosinophils Relative: 3.4 % (ref 0.0–5.0)
HCT: 38 % (ref 36.0–46.0)
Hemoglobin: 13 g/dL (ref 12.0–15.0)
Lymphocytes Relative: 34.6 % (ref 12.0–46.0)
Lymphs Abs: 2.2 10*3/uL (ref 0.7–4.0)
MCHC: 34.2 g/dL (ref 30.0–36.0)
MCV: 94.1 fl (ref 78.0–100.0)
Monocytes Absolute: 0.7 10*3/uL (ref 0.1–1.0)
Monocytes Relative: 10.6 % (ref 3.0–12.0)
Neutro Abs: 3.3 10*3/uL (ref 1.4–7.7)
Neutrophils Relative %: 50.5 % (ref 43.0–77.0)
Platelets: 313 10*3/uL (ref 150.0–400.0)
RBC: 4.04 Mil/uL (ref 3.87–5.11)
RDW: 13.2 % (ref 11.5–15.5)
WBC: 6.4 10*3/uL (ref 4.0–10.5)

## 2019-05-22 LAB — COMPREHENSIVE METABOLIC PANEL
ALT: 34 U/L (ref 0–35)
AST: 26 U/L (ref 0–37)
Albumin: 4.4 g/dL (ref 3.5–5.2)
Alkaline Phosphatase: 64 U/L (ref 39–117)
BUN: 14 mg/dL (ref 6–23)
CO2: 26 mEq/L (ref 19–32)
Calcium: 10.1 mg/dL (ref 8.4–10.5)
Chloride: 104 mEq/L (ref 96–112)
Creatinine, Ser: 0.81 mg/dL (ref 0.40–1.20)
GFR: 69.5 mL/min (ref >60.00)
Glucose, Bld: 98 mg/dL (ref 70–99)
Potassium: 4.4 mEq/L (ref 3.5–5.1)
Sodium: 138 mEq/L (ref 135–145)
Total Bilirubin: 0.9 mg/dL (ref 0.2–1.2)
Total Protein: 6.8 g/dL (ref 6.0–8.3)

## 2019-05-22 LAB — LIPID PANEL
Cholesterol: 135 mg/dL (ref 0–200)
HDL: 54.3 mg/dL (ref >39.00)
LDL Cholesterol: 51 mg/dL (ref 0–99)
NonHDL: 80.55
Total CHOL/HDL Ratio: 2
Triglycerides: 149 mg/dL (ref 0.0–149.0)
VLDL: 29.8 mg/dL (ref 0.0–40.0)

## 2019-05-22 LAB — VITAMIN D 25 HYDROXY (VIT D DEFICIENCY, FRACTURES): VITD: 33.22 ng/mL (ref 30.00–100.00)

## 2019-05-22 LAB — TSH: TSH: 1.86 u[IU]/mL (ref 0.35–4.50)

## 2019-05-22 LAB — HEMOGLOBIN A1C: Hgb A1c MFr Bld: 5.3 % (ref 4.6–6.5)

## 2019-05-24 ENCOUNTER — Ambulatory Visit (INDEPENDENT_AMBULATORY_CARE_PROVIDER_SITE_OTHER): Payer: Medicare HMO

## 2019-05-24 ENCOUNTER — Other Ambulatory Visit: Payer: Self-pay

## 2019-05-24 ENCOUNTER — Encounter: Payer: Self-pay | Admitting: Family Medicine

## 2019-05-24 ENCOUNTER — Ambulatory Visit: Payer: Medicare HMO

## 2019-05-24 DIAGNOSIS — Z23 Encounter for immunization: Secondary | ICD-10-CM | POA: Diagnosis not present

## 2019-05-24 NOTE — Progress Notes (Signed)
Pt here today for shingles and flu shot.   Right Arm: shingles shot   Left Arm: flu shot   Pt given shingles and flu shot  tolerated it well.  Pt is aware that she has to pay for shingles shot.

## 2019-05-26 ENCOUNTER — Encounter: Payer: Self-pay | Admitting: *Deleted

## 2019-06-07 ENCOUNTER — Other Ambulatory Visit: Payer: Self-pay | Admitting: Family Medicine

## 2019-08-01 ENCOUNTER — Ambulatory Visit (INDEPENDENT_AMBULATORY_CARE_PROVIDER_SITE_OTHER): Payer: Medicare HMO | Admitting: *Deleted

## 2019-08-01 ENCOUNTER — Other Ambulatory Visit: Payer: Self-pay

## 2019-08-01 DIAGNOSIS — Z23 Encounter for immunization: Secondary | ICD-10-CM | POA: Diagnosis not present

## 2019-08-01 NOTE — Progress Notes (Signed)
Pt here today for second shingles vaccine.  Vaccine given in right deltoid and patient tolerated well.  Pt is aware that she has to pay for shingles vaccine and abn signed.

## 2019-08-04 ENCOUNTER — Telehealth: Payer: Self-pay | Admitting: Family Medicine

## 2019-08-04 NOTE — Telephone Encounter (Signed)
Patient is calling to schedule a projolia  Injection. She received a letter from her insurance company that she is approved to receive the injection from November 24- August 31, 2019 Please advise CB [phone] or [phone]

## 2019-08-14 ENCOUNTER — Ambulatory Visit: Payer: Medicare HMO | Admitting: Family Medicine

## 2019-08-14 NOTE — Telephone Encounter (Signed)
Pt calling back to check status. Pt states that she has not received a call back yet.

## 2019-08-14 NOTE — Telephone Encounter (Signed)
Patient has been scheduled prolia injection

## 2019-08-15 ENCOUNTER — Other Ambulatory Visit: Payer: Self-pay

## 2019-08-15 ENCOUNTER — Ambulatory Visit (INDEPENDENT_AMBULATORY_CARE_PROVIDER_SITE_OTHER): Payer: Medicare HMO

## 2019-08-15 DIAGNOSIS — M810 Age-related osteoporosis without current pathological fracture: Secondary | ICD-10-CM | POA: Diagnosis not present

## 2019-08-15 MED ORDER — DENOSUMAB 60 MG/ML ~~LOC~~ SOSY
60.0000 mg | PREFILLED_SYRINGE | Freq: Once | SUBCUTANEOUS | Status: AC
  Administered 2019-08-15: 11:00:00 60 mg via SUBCUTANEOUS

## 2019-08-15 MED ORDER — DENOSUMAB 60 MG/ML ~~LOC~~ SOSY: 60.0000 mg | PREFILLED_SYRINGE | Freq: Once | SUBCUTANEOUS | Status: DC

## 2019-08-15 NOTE — Progress Notes (Addendum)
Patient here today for prolia injection. 77mL prolia given in left arm SQ. Patient tolerated well.   Nursing note reviewed. Agree with documention and plan.

## 2019-08-29 ENCOUNTER — Encounter: Payer: Self-pay | Admitting: Family Medicine

## 2019-08-29 ENCOUNTER — Ambulatory Visit (INDEPENDENT_AMBULATORY_CARE_PROVIDER_SITE_OTHER): Payer: Medicare HMO | Admitting: Family Medicine

## 2019-08-29 ENCOUNTER — Other Ambulatory Visit: Payer: Self-pay

## 2019-08-29 DIAGNOSIS — E039 Hypothyroidism, unspecified: Secondary | ICD-10-CM

## 2019-08-29 DIAGNOSIS — E782 Mixed hyperlipidemia: Secondary | ICD-10-CM

## 2019-08-29 DIAGNOSIS — I10 Essential (primary) hypertension: Secondary | ICD-10-CM

## 2019-08-29 DIAGNOSIS — E559 Vitamin D deficiency, unspecified: Secondary | ICD-10-CM | POA: Diagnosis not present

## 2019-08-29 DIAGNOSIS — R739 Hyperglycemia, unspecified: Secondary | ICD-10-CM

## 2019-08-30 NOTE — Assessment & Plan Note (Signed)
On Levothyroxine, continue to monitor 

## 2019-08-30 NOTE — Assessment & Plan Note (Signed)
Supplement and monitor 

## 2019-08-30 NOTE — Assessment & Plan Note (Signed)
Monitor at home and report any concerns. no changes to meds. Encouraged heart healthy diet such as the DASH diet and exercise as tolerated.

## 2019-08-30 NOTE — Assessment & Plan Note (Addendum)
Encouraged heart healthy diet, increase exercise, avoid trans fats, consider a krill oil cap daily. Continue Atorvastatin

## 2019-08-30 NOTE — Assessment & Plan Note (Signed)
hgba1c acceptable, minimize simple carbs. Increase exercise as tolerated.  

## 2019-08-30 NOTE — Progress Notes (Signed)
Virtual Visit via Video Note  I connected with Michelle Jacobs on 12/29/20at 10:00 AM EST by a video enabled telemedicine application and verified that I am speaking with the correct person using two identifiers.  Location: Patient: home Provider: office   I discussed the limitations of evaluation and management by telemedicine and the availability of in person appointments. The patient expressed understanding and agreed to proceed. Michelle Jacobs, CMA was able to get the patient set up on a visit, video    Subjective:    Patient ID: Michelle Jacobs, female    DOB: 03-27-1946, 73 y.o.   MRN: [id number]  No chief complaint on file.   HPI Patient is in today for follow up on chronic medical concerns including hypertension, hypothyroidism, hyperlipidemia and more. No recent febrile illness or hospitalizaitons. No polyuria or polydipsia. She is maintaining quarantine well. Is trying to maintain a heart healthy diet and stay active in [place]. Denies CP/palp/SOB/HA/congestion/fevers/GI or GU c/o. Taking meds as prescribed  Past Medical History:  Diagnosis Date  . Allergy   . Annual physical exam 10/06/2015  . GERD (gastroesophageal reflux disease)    mild  . History of chicken pox   . Hyperglycemia 05/20/2013  . Hyperlipidemia   . Hypertension   . Medicare annual wellness visit, subsequent 12/02/2011  . Personal history of colonic polyps 2009   Benign colon polyps  . Preventative health care 12/02/2011  . Verrucous skin lesion 11/04/2016    No past surgical history on file.  Family History  Problem Relation Age of Onset  . Cancer Mother        lung  . Hypertension Mother   . Heart disease Father        CHF  . Diabetes Maternal Uncle        type II  . Heart disease Maternal Uncle        s/p CABG  . Cancer Maternal Grandmother 35       breast  . Heart disease Maternal Grandmother        MI, CHF  . Obesity Maternal Grandmother   . Cancer Sister        uterine  .  Hypertension Sister   . Hyperlipidemia Brother   . Hypertension Brother   . Cancer Brother        HPV associated oral cancer  . Hearing loss Brother   . Kidney disease Maternal Grandfather        kidney failure  . Cancer Paternal Grandmother   . COPD Paternal Grandfather   . Diabetes Maternal Aunt        older, type 2    Social History   Socioeconomic History  . Marital status: Married    Spouse name: Not on file  . Number of children: 2  . Years of education: Not on file  . Highest education level: Not on file  Occupational History    Employer: [employer]  Tobacco Use  . Smoking status: Never Smoker  . Smokeless tobacco: Never Used  Substance and Sexual Activity  . Alcohol use: Yes    Alcohol/week: 10.0 standard drinks    Types: 10 Glasses of wine per week  . Drug use: No  . Sexual activity: Not on file    Comment: lives with husband, no dietary restrictions  Other Topics Concern  . Not on file  Social History Narrative   Caffeine use:  2-3 cups coffee daily   Regular exercise:  Walks daily  Social Determinants of Health   Financial Resource Strain:   . Difficulty of Paying Living Expenses: Not on file  Food Insecurity:   . Worried About Charity fundraiser in the Last Year: Not on file  . Ran Out of Food in the Last Year: Not on file  Transportation Needs:   . Lack of Transportation (Medical): Not on file  . Lack of Transportation (Non-Medical): Not on file  Physical Activity:   . Days of Exercise per Week: Not on file  . Minutes of Exercise per Session: Not on file  Stress:   . Feeling of Stress : Not on file  Social Connections:   . Frequency of Communication with Friends and Family: Not on file  . Frequency of Social Gatherings with Friends and Family: Not on file  . Attends Religious Services: Not on file  . Active Member of Clubs or Organizations: Not on file  . Attends Archivist Meetings: Not on file  . Marital  Status: Not on file  Intimate Partner Violence:   . Fear of Current or Ex-Partner: Not on file  . Emotionally Abused: Not on file  . Physically Abused: Not on file  . Sexually Abused: Not on file    Outpatient Medications Prior to Visit  Medication Sig Dispense Refill  . alendronate (FOSAMAX) 70 MG tablet TAKE 1 TABLET (70 MG TOTAL) BY MOUTH EVERY 7 (SEVEN) DAYS. TAKE WITH A FULL GLASS OF WATER ON AN EMPTY STOMACH. 12 tablet 1  . atorvastatin (LIPITOR) 10 MG tablet Take 1 tablet (10 mg total) by mouth daily. 90 tablet 3  . cetirizine (ZYRTEC) 10 MG tablet Take 10 mg by mouth daily.    . ergocalciferol (VITAMIN D2) 1.25 MG (50000 UT) capsule Take 5,000 Units by mouth 4 (four) times a week.    Marland Kitchen FOLIC ACID PO Take by mouth.    Javier Docker Oil 1000 MG CAPS Take 1,000 mg by mouth daily.    Marland Kitchen levothyroxine (SYNTHROID, LEVOTHROID) 150 MCG tablet Take 1 tablet (150 mcg total) by mouth daily before breakfast. 90 tablet 1  . lisinopril (PRINIVIL,ZESTRIL) 10 MG tablet Take 1 tablet (10 mg total) by mouth daily. 90 tablet 3  . Multiple Vitamin (MULTIVITAMIN) tablet Take 1 tablet by mouth daily.    . TURMERIC PO Take by mouth.    . fluticasone (FLONASE) 50 MCG/ACT nasal spray Place 2 sprays into both nostrils daily. (Patient not taking: Reported on 05/12/2019) 16 g 1   No facility-administered medications prior to visit.    Allergies  Allergen Reactions  . Codeine Nausea Only  . Iodine Rash  . Mercurochrome [Merbromin] Rash  . Sulfa Antibiotics Rash    Review of Systems  Constitutional: Negative for fever and malaise/fatigue.  HENT: Negative for congestion.   Eyes: Negative for blurred vision.  Respiratory: Negative for shortness of breath.   Cardiovascular: Negative for chest pain, palpitations and leg swelling.  Gastrointestinal: Negative for abdominal pain, blood in stool and nausea.  Genitourinary: Negative for dysuria and frequency.  Musculoskeletal: Negative for falls.  Skin: Negative  for rash.  Neurological: Negative for dizziness, loss of consciousness and headaches.  Endo/Heme/Allergies: Negative for environmental allergies.  Psychiatric/Behavioral: Negative for depression. The patient is not nervous/anxious.        Objective:    Physical Exam Constitutional:      Appearance: Normal appearance. She is not ill-appearing.  HENT:     Head: Normocephalic and atraumatic.  Eyes:  General:        Right eye: No discharge.        Left eye: No discharge.     Conjunctiva/sclera: Conjunctivae normal.  Pulmonary:     Effort: Pulmonary effort is normal.  Neurological:     Mental Status: She is alert and oriented to person, place, and time.  Psychiatric:        Mood and Affect: Mood normal.        Behavior: Behavior normal.     Wt 158 lb (71.7 kg)   BMI 30.86 kg/m  Wt Readings from Last 3 Encounters:  08/29/19 158 lb (71.7 kg)  11/11/18 158 lb 9.6 oz (71.9 kg)  11/03/18 157 lb 9.6 oz (71.5 kg)    Diabetic Foot Exam - Simple   No data filed     Lab Results  Component Value Date   WBC 6.4 05/22/2019   HGB 13.0 05/22/2019   HCT 38.0 05/22/2019   PLT 313.0 05/22/2019   GLUCOSE 98 05/22/2019   CHOL 135 05/22/2019   TRIG 149.0 05/22/2019   HDL 54.30 05/22/2019   LDLCALC 51 05/22/2019   ALT 34 05/22/2019   AST 26 05/22/2019   NA 138 05/22/2019   K 4.4 05/22/2019   CL 104 05/22/2019   CREATININE 0.81 05/22/2019   BUN 14 05/22/2019   CO2 26 05/22/2019   TSH 1.86 05/22/2019   HGBA1C 5.3 05/22/2019    Lab Results  Component Value Date   TSH 1.86 05/22/2019   Lab Results  Component Value Date   WBC 6.4 05/22/2019   HGB 13.0 05/22/2019   HCT 38.0 05/22/2019   MCV 94.1 05/22/2019   PLT 313.0 05/22/2019   Lab Results  Component Value Date   NA 138 05/22/2019   K 4.4 05/22/2019   CO2 26 05/22/2019   GLUCOSE 98 05/22/2019   BUN 14 05/22/2019   CREATININE 0.81 05/22/2019   BILITOT 0.9 05/22/2019   ALKPHOS 64 05/22/2019   AST 26  05/22/2019   ALT 34 05/22/2019   PROT 6.8 05/22/2019   ALBUMIN 4.4 05/22/2019   CALCIUM 10.1 05/22/2019   GFR 69.50 05/22/2019   Lab Results  Component Value Date   CHOL 135 05/22/2019   Lab Results  Component Value Date   HDL 54.30 05/22/2019   Lab Results  Component Value Date   LDLCALC 51 05/22/2019   Lab Results  Component Value Date   TRIG 149.0 05/22/2019   Lab Results  Component Value Date   CHOLHDL 2 05/22/2019   Lab Results  Component Value Date   HGBA1C 5.3 05/22/2019       Assessment & Plan:   Problem List Items Addressed This Visit    HTN (hypertension)    Monitor at home and report any concerns. no changes to meds. Encouraged heart healthy diet such as the DASH diet and exercise as tolerated.       Hyperlipidemia    Encouraged heart healthy diet, increase exercise, avoid trans fats, consider a krill oil cap daily. Continue Atorvastatin      Hypothyroidism    On Levothyroxine, continue to monitor      Hyperglycemia    hgba1c acceptable, minimize simple carbs. Increase exercise as tolerated.       Vitamin D deficiency    Supplement and monitor         I am having Brynlyn L. Bogacz maintain her cetirizine, multivitamin, Krill Oil, fluticasone, TURMERIC PO, lisinopril, atorvastatin, levothyroxine, ergocalciferol, FOLIC ACID  PO, and alendronate.  No orders of the defined types were placed in this encounter.   I discussed the assessment and treatment plan with the patient. The patient was provided an opportunity to ask questions and all were answered. The patient agreed with the plan and demonstrated an understanding of the instructions.   The patient was advised to call back or seek an in-person evaluation if the symptoms worsen or if the condition fails to improve as anticipated.  I provided 25 minutes of non-face-to-face time during this encounter.   Penni Homans, MD

## 2019-10-05 DIAGNOSIS — R69 Illness, unspecified: Secondary | ICD-10-CM | POA: Diagnosis not present

## 2019-10-09 ENCOUNTER — Ambulatory Visit: Payer: Medicare HMO | Attending: Internal Medicine

## 2019-10-09 DIAGNOSIS — Z23 Encounter for immunization: Secondary | ICD-10-CM

## 2019-10-09 NOTE — Progress Notes (Signed)
   Covid-19 Vaccination Clinic  Name:  Michelle Jacobs    MRN: [id number] DOB: 08/28/1946  10/09/2019  [name] was observed post Covid-19 immunization for 15 minutes without incidence. She was provided with Vaccine Information Sheet and instruction to access the V-Safe system.   [name] was instructed to call 911 with any severe reactions post vaccine: Marland Kitchen Difficulty breathing  . Swelling of your face and throat  . A fast heartbeat  . A bad rash all over your body  . Dizziness and weakness    Immunizations Administered    Name Date Dose VIS Date Route   Pfizer COVID-19 Vaccine 10/09/2019  6:11 PM 0.3 mL 08/11/2019 Intramuscular   Manufacturer: [place]   Lot: [id number]   [employer]: [id number]

## 2019-11-03 ENCOUNTER — Ambulatory Visit: Payer: Medicare HMO | Attending: Internal Medicine

## 2019-11-03 DIAGNOSIS — Z23 Encounter for immunization: Secondary | ICD-10-CM

## 2019-11-03 NOTE — Progress Notes (Signed)
   Covid-19 Vaccination Clinic  Name:  Michelle Jacobs    MRN: [id number] DOB: [birth year]  11/03/2019  [name] was observed post Covid-19 immunization for 15 minutes without incident. She was provided with Vaccine Information Sheet and instruction to access the V-Safe system.   [name] was instructed to call 911 with any severe reactions post vaccine: Marland Kitchen Difficulty breathing  . Swelling of face and throat  . A fast heartbeat  . A bad rash all over body  . Dizziness and weakness   Immunizations Administered    Name Date Dose VIS Date Route   Pfizer COVID-19 Vaccine 11/03/2019  5:36 PM 0.3 mL 08/11/2019 Intramuscular   Manufacturer: [place]   Lot: [id number]   [place]: [id number]

## 2019-11-10 ENCOUNTER — Other Ambulatory Visit: Payer: Self-pay

## 2019-11-10 NOTE — Progress Notes (Signed)
Subjective:   Michelle Jacobs is a 74 y.o. female who presents for Medicare Annual (Subsequent) preventive examination.  Works at [employer] office 30 hrs per week.  Review of Systems:   Home Safety/Smoke Alarms: Feels safe in home. Smoke alarms in place.  Lives w/ husband in ranch w/ full basement.    Female:      Mammo-  11/08/18. declines     Dexa scan-  11/07/18      CCS- 09/26/14.     Objective:     Vitals: BP (!) 113/56 (BP Location: Left Arm, Patient Position: Sitting) Comment: all vitals done by Kem Boroughs, CMA  Pulse 80   Temp (!) 96.5 F (35.8 C) (Temporal)   Ht 5\' 5"  (1.651 m)   Wt 160 lb 11.5 oz (72.9 kg)   SpO2 98%   BMI 26.74 kg/m   Body mass index is 26.74 kg/m.  Advanced Directives 11/13/2019 11/11/2018 09/25/2016 09/23/2015  Does Patient Have a Medical Advance Directive? Yes Yes No;Yes Yes  Type of Paramedic of [place];Living will [place];Living will [place];Living will [place];Living will  Does patient want to make changes to medical advance directive? No - Patient declined Yes (MAU/Ambulatory/Procedural Areas - Information given) Yes (MAU/Ambulatory/Procedural Areas - Information given) No - Patient declined  Copy of [place] in Chart? No - copy requested Yes - validated most recent copy scanned in chart (See row information) Yes No - copy requested  Would patient like information on creating a medical advance directive? - - No - Patient declined -    Tobacco Social History   Tobacco Use  Smoking Status Never Smoker  Smokeless Tobacco Never Used     Counseling given: Not Answered   Clinical Intake:  Pain : No/denies pain     Past Medical History:  Diagnosis Date  . Allergy   . Annual physical exam 10/06/2015  . GERD (gastroesophageal reflux disease)    mild  . History of chicken pox   . Hyperglycemia 05/20/2013  . Hyperlipidemia    . Hypertension   . Medicare annual wellness visit, subsequent 12/02/2011  . Personal history of colonic polyps 2009   Benign colon polyps  . Preventative health care 12/02/2011  . Verrucous skin lesion 11/04/2016   History reviewed. No pertinent surgical history. Family History  Problem Relation Age of Onset  . Cancer Mother        lung  . Hypertension Mother   . Heart disease Father        CHF  . Diabetes Maternal Uncle        type II  . Heart disease Maternal Uncle        s/p CABG  . Cancer Maternal Grandmother 75       breast  . Heart disease Maternal Grandmother        MI, CHF  . Obesity Maternal Grandmother   . Cancer Sister        uterine  . Hypertension Sister   . Hyperlipidemia Brother   . Hypertension Brother   . Cancer Brother        HPV associated oral cancer  . Hearing loss Brother   . Kidney disease Maternal Grandfather        kidney failure  . Cancer Paternal Grandmother   . COPD Paternal Grandfather   . Diabetes Maternal Aunt        older, type 2  Social History   Socioeconomic History  . Marital status: Married    Spouse name: Not on file  . Number of children: 2  . Years of education: Not on file  . Highest education level: Not on file  Occupational History    Employer: [employer]  Tobacco Use  . Smoking status: Never Smoker  . Smokeless tobacco: Never Used  Substance and Sexual Activity  . Alcohol use: Yes    Alcohol/week: 10.0 standard drinks    Types: 10 Glasses of wine per week  . Drug use: No  . Sexual activity: Not on file    Comment: lives with husband, no dietary restrictions  Other Topics Concern  . Not on file  Social History Narrative   Caffeine use:  2-3 cups coffee daily   Regular exercise:  Walks daily         Social Determinants of Health   Financial Resource Strain: Low Risk   . Difficulty of Paying Living Expenses: Not hard at all  Food Insecurity: No Food Insecurity  . Worried About Sales executive in the Last Year: Never true  . Ran Out of Food in the Last Year: Never true  Transportation Needs: No Transportation Needs  . Lack of Transportation (Medical): No  . Lack of Transportation (Non-Medical): No  Physical Activity:   . Days of Exercise per Week:   . Minutes of Exercise per Session:   Stress:   . Feeling of Stress :   Social Connections:   . Frequency of Communication with Friends and Family:   . Frequency of Social Gatherings with Friends and Family:   . Attends Religious Services:   . Active Member of Clubs or Organizations:   . Attends Archivist Meetings:   Marland Kitchen Marital Status:     Outpatient Encounter Medications as of 11/13/2019  Medication Sig  . atorvastatin (LIPITOR) 10 MG tablet Take 1 tablet (10 mg total) by mouth daily.  . cetirizine (ZYRTEC) 10 MG tablet Take 10 mg by mouth daily.  . ergocalciferol (VITAMIN D2) 1.25 MG (50000 UT) capsule Take 5,000 Units by mouth 4 (four) times a week.  . levothyroxine (SYNTHROID) 150 MCG tablet Take 1 tablet (150 mcg total) by mouth daily before breakfast.  . lisinopril (ZESTRIL) 10 MG tablet Take 1 tablet (10 mg total) by mouth daily.  . [DISCONTINUED] alendronate (FOSAMAX) 70 MG tablet TAKE 1 TABLET (70 MG TOTAL) BY MOUTH EVERY 7 (SEVEN) DAYS. TAKE WITH A FULL GLASS OF WATER ON AN EMPTY STOMACH.  . [DISCONTINUED] atorvastatin (LIPITOR) 10 MG tablet Take 1 tablet (10 mg total) by mouth daily.  . [DISCONTINUED] fluticasone (FLONASE) 50 MCG/ACT nasal spray Place 2 sprays into both nostrils daily. (Patient not taking: Reported on 05/12/2019)  . [DISCONTINUED] FOLIC ACID PO Take by mouth.  . [DISCONTINUED] Krill Oil 1000 MG CAPS Take 1,000 mg by mouth daily.  . [DISCONTINUED] levothyroxine (SYNTHROID, LEVOTHROID) 150 MCG tablet Take 1 tablet (150 mcg total) by mouth daily before breakfast.  . [DISCONTINUED] lisinopril (PRINIVIL,ZESTRIL) 10 MG tablet Take 1 tablet (10 mg total) by mouth daily.  . [DISCONTINUED]  Multiple Vitamin (MULTIVITAMIN) tablet Take 1 tablet by mouth daily.  . [DISCONTINUED] TURMERIC PO Take by mouth.   No facility-administered encounter medications on file as of 11/13/2019.    Activities of Daily Living In your present state of health, do you have any difficulty performing the following activities: 11/13/2019 05/12/2019  Hearing? N N  Vision? N  N  Difficulty concentrating or making decisions? N N  Walking or climbing stairs? N N  Dressing or bathing? N N  Doing errands, shopping? N N  Preparing Food and eating ? N -  Using the Toilet? N -  In the past six months, have you accidently leaked urine? N -  Do you have problems with loss of bowel control? N -  Managing your Medications? N -  Managing your Finances? N -  Housekeeping or managing your Housekeeping? N -  Some recent data might be hidden    Patient Care Team: Mosie Lukes, MD as PCP - General (Family Medicine)    Assessment:   This is a routine wellness examination for [facility]. Physical assessment deferred to PCP.  Exercise Activities and Dietary recommendations Current Exercise Habits: Home exercise routine, Type of exercise: walking, Time (Minutes): 30, Frequency (Times/Week): 5, Weekly Exercise (Minutes/Week): 150, Exercise limited by: None identified Diet (meal preparation, eat out, water intake, caffeinated beverages, dairy products, fruits and vegetables): in general, a "healthy" diet  , well balanced       Goals    . Continue to eat healthy and exercise    . increase sleep        Fall Risk Fall Risk  11/13/2019 05/12/2019 11/11/2018 09/25/2016 11/14/2015  Falls in the past year? 0 0 0 No Yes  Number falls in past yr: 0 0 - - 1  Comment - - - - -  Injury with Fall? 0 0 - - No  Risk for fall due to : - - - - Impaired balance/gait  Follow up Education provided;Falls prevention discussed - - - Falls prevention discussed     Depression Screen PHQ 2/9 Scores 11/13/2019 05/12/2019 11/11/2018  09/25/2016  PHQ - 2 Score 0 0 0 0  PHQ- 9 Score - 0 - -  Exception Documentation - - - -     Cognitive Function Ad8 score reviewed for issues:  Issues making decisions:no  Less interest in hobbies / activities:no  Repeats questions, stories (family complaining):no  Trouble using ordinary gadgets (microwave, computer, phone): no  Forgets the month or year: no  Mismanaging finances: no  Remembering appts:no  Daily problems with thinking and/or memory:no Ad8 score is=0     MMSE - Mini Mental State Exam 09/25/2016  Orientation to time 5  Orientation to Place 5  Registration 3  Attention/ Calculation 5  Recall 3  Language- name 2 objects 2  Language- repeat 1  Language- follow 3 step command 3  Language- read & follow direction 1  Write a sentence 1  Copy design 1  Total score 30        Immunization History  Administered Date(s) Administered  . Fluad Quad(high Dose 65+) 05/24/2019  . Influenza Split 05/30/2012  . Influenza, High Dose Seasonal PF 07/20/2014, 09/25/2016, 05/25/2017, 06/03/2018  . Influenza,inj,Quad PF,6+ Mos 05/15/2013, 09/23/2015  . PFIZER SARS-COV-2 Vaccination 10/09/2019, 11/03/2019  . Pneumococcal Conjugate-13 07/30/2014  . Pneumococcal Polysaccharide-23 09/23/2015  . Tdap 09/03/2011  . Zoster 03/28/2013  . Zoster Recombinat (Shingrix) 05/24/2019, 08/01/2019   Screening Tests Health Maintenance  Topic Date Due  . MAMMOGRAM  11/06/2020  . TETANUS/TDAP  09/02/2021  . COLONOSCOPY  09/26/2024  . INFLUENZA VACCINE  Completed  . DEXA SCAN  Completed  . Hepatitis C Screening  Completed  . PNA vac Low Risk Adult  Completed      Plan:     Please schedule your next medicare wellness visit with  me in 1 yr.  Continue to eat heart healthy diet (full of fruits, vegetables, whole grains, lean protein, water--limit salt, fat, and sugar intake) and increase physical activity as tolerated.  Continue doing brain stimulating activities (puzzles,  reading, adult coloring books, staying active) to keep memory sharp.    I have personally reviewed and noted the following in the patient's chart:   . Medical and social history . Use of alcohol, tobacco or illicit drugs  . Current medications and supplements . Functional ability and status . Nutritional status . Physical activity . Advanced directives . List of other physicians . Hospitalizations, surgeries, and ER visits in previous 12 months . Vitals . Screenings to include cognitive, depression, and falls . Referrals and appointments  In addition, I have reviewed and discussed with patient certain preventive protocols, quality metrics, and best practice recommendations. A written personalized care plan for preventive services as well as general preventive health recommendations were provided to patient.     Shela Nevin, [place]  11/13/2019

## 2019-11-13 ENCOUNTER — Other Ambulatory Visit: Payer: Self-pay

## 2019-11-13 ENCOUNTER — Ambulatory Visit (INDEPENDENT_AMBULATORY_CARE_PROVIDER_SITE_OTHER): Payer: Medicare HMO | Admitting: Family Medicine

## 2019-11-13 ENCOUNTER — Ambulatory Visit (INDEPENDENT_AMBULATORY_CARE_PROVIDER_SITE_OTHER): Payer: Medicare HMO | Admitting: *Deleted

## 2019-11-13 ENCOUNTER — Encounter: Payer: Self-pay | Admitting: *Deleted

## 2019-11-13 VITALS — BP 113/56 | HR 80 | Temp 96.5°F | Ht 65.0 in | Wt 160.7 lb

## 2019-11-13 DIAGNOSIS — E782 Mixed hyperlipidemia: Secondary | ICD-10-CM

## 2019-11-13 DIAGNOSIS — E039 Hypothyroidism, unspecified: Secondary | ICD-10-CM | POA: Diagnosis not present

## 2019-11-13 DIAGNOSIS — R739 Hyperglycemia, unspecified: Secondary | ICD-10-CM

## 2019-11-13 DIAGNOSIS — E559 Vitamin D deficiency, unspecified: Secondary | ICD-10-CM | POA: Diagnosis not present

## 2019-11-13 DIAGNOSIS — I10 Essential (primary) hypertension: Secondary | ICD-10-CM | POA: Diagnosis not present

## 2019-11-13 DIAGNOSIS — Z0000 Encounter for general adult medical examination without abnormal findings: Secondary | ICD-10-CM

## 2019-11-13 LAB — COMPREHENSIVE METABOLIC PANEL
ALT: 21 U/L (ref 0–35)
AST: 19 U/L (ref 0–37)
Albumin: 4.6 g/dL (ref 3.5–5.2)
Alkaline Phosphatase: 57 U/L (ref 39–117)
BUN: 17 mg/dL (ref 6–23)
CO2: 29 mEq/L (ref 19–32)
Calcium: 10.2 mg/dL (ref 8.4–10.5)
Chloride: 102 mEq/L (ref 96–112)
Creatinine, Ser: 0.8 mg/dL (ref 0.40–1.20)
GFR: 70.41 mL/min (ref >60.00)
Glucose, Bld: 112 mg/dL — ABNORMAL HIGH (ref 70–99)
Potassium: 4.7 mEq/L (ref 3.5–5.1)
Sodium: 138 mEq/L (ref 135–145)
Total Bilirubin: 0.5 mg/dL (ref 0.2–1.2)
Total Protein: 7.4 g/dL (ref 6.0–8.3)

## 2019-11-13 LAB — LIPID PANEL
Cholesterol: 153 mg/dL (ref 0–200)
HDL: 62.8 mg/dL (ref >39.00)
LDL Cholesterol: 55 mg/dL (ref 0–99)
NonHDL: 89.96
Total CHOL/HDL Ratio: 2
Triglycerides: 176 mg/dL — ABNORMAL HIGH (ref 0.0–149.0)
VLDL: 35.2 mg/dL (ref 0.0–40.0)

## 2019-11-13 LAB — CBC
HCT: 39.9 % (ref 36.0–46.0)
Hemoglobin: 13.7 g/dL (ref 12.0–15.0)
MCHC: 34.4 g/dL (ref 30.0–36.0)
MCV: 94.3 fl (ref 78.0–100.0)
Platelets: 327 10*3/uL (ref 150.0–400.0)
RBC: 4.23 Mil/uL (ref 3.87–5.11)
RDW: 13 % (ref 11.5–15.5)
WBC: 8.1 10*3/uL (ref 4.0–10.5)

## 2019-11-13 LAB — TSH: TSH: 2.83 u[IU]/mL (ref 0.35–4.50)

## 2019-11-13 LAB — VITAMIN D 25 HYDROXY (VIT D DEFICIENCY, FRACTURES): VITD: 35.95 ng/mL (ref 30.00–100.00)

## 2019-11-13 MED ORDER — LISINOPRIL 10 MG PO TABS: 10.0000 mg | ORAL_TABLET | Freq: Every day | ORAL | 1 refills | Status: DC

## 2019-11-13 MED ORDER — ATORVASTATIN CALCIUM 10 MG PO TABS: 10.0000 mg | ORAL_TABLET | Freq: Every day | ORAL | 1 refills | Status: DC

## 2019-11-13 MED ORDER — LEVOTHYROXINE SODIUM 150 MCG PO TABS: 150.0000 ug | ORAL_TABLET | Freq: Every day | ORAL | 1 refills | Status: DC

## 2019-11-13 NOTE — Assessment & Plan Note (Signed)
Encouraged heart healthy diet, increase exercise, avoid trans fats, consider a krill oil cap daily 

## 2019-11-13 NOTE — Progress Notes (Signed)
Subjective:    Patient ID: Michelle Jacobs, female    DOB: 10/23/1945, 74 y.o.   MRN: [id number]  Chief Complaint  Patient presents with  . Hypertension    HPI Patient is in today for follow up on chronic medical concerns. They are in the middle of a major house remodel and it is taking a long time. It is very stressful. No recnet febrile illness or hospitalizations. No c/o polyuria or polydipsia. Denies CP/palp/SOB/HA/congestion/fevers/GI or GU c/o. Taking meds as prescribed  Past Medical History:  Diagnosis Date  . Allergy   . Annual physical exam 10/06/2015  . GERD (gastroesophageal reflux disease)    mild  . History of chicken pox   . Hyperglycemia 05/20/2013  . Hyperlipidemia   . Hypertension   . Medicare annual wellness visit, subsequent 12/02/2011  . Personal history of colonic polyps 2009   Benign colon polyps  . Preventative health care 12/02/2011  . Verrucous skin lesion 11/04/2016    No past surgical history on file.  Family History  Problem Relation Age of Onset  . Cancer Mother        lung  . Hypertension Mother   . Heart disease Father        CHF  . Diabetes Maternal Uncle        type II  . Heart disease Maternal Uncle        s/p CABG  . Cancer Maternal Grandmother 9       breast  . Heart disease Maternal Grandmother        MI, CHF  . Obesity Maternal Grandmother   . Cancer Sister        uterine  . Hypertension Sister   . Hyperlipidemia Brother   . Hypertension Brother   . Cancer Brother        HPV associated oral cancer  . Hearing loss Brother   . Kidney disease Maternal Grandfather        kidney failure  . Cancer Paternal Grandmother   . COPD Paternal Grandfather   . Diabetes Maternal Aunt        older, type 2    Social History   Socioeconomic History  . Marital status: Married    Spouse name: Not on file  . Number of children: 2  . Years of education: Not on file  . Highest education level: Not on file  Occupational History   Employer: [employer]  Tobacco Use  . Smoking status: Never Smoker  . Smokeless tobacco: Never Used  Substance and Sexual Activity  . Alcohol use: Yes    Alcohol/week: 10.0 standard drinks    Types: 10 Glasses of wine per week  . Drug use: No  . Sexual activity: Not on file    Comment: lives with husband, no dietary restrictions  Other Topics Concern  . Not on file  Social History Narrative   Caffeine use:  2-3 cups coffee daily   Regular exercise:  Walks daily         Social Determinants of Health   Financial Resource Strain: Low Risk   . Difficulty of Paying Living Expenses: Not hard at all  Food Insecurity: No Food Insecurity  . Worried About Charity fundraiser in the Last Year: Never true  . Ran Out of Food in the Last Year: Never true  Transportation Needs: No Transportation Needs  . Lack of Transportation (Medical): No  . Lack of Transportation (Non-Medical): No  Physical Activity:   .  Days of Exercise per Week:   . Minutes of Exercise per Session:   Stress:   . Feeling of Stress :   Social Connections:   . Frequency of Communication with Friends and Family:   . Frequency of Social Gatherings with Friends and Family:   . Attends Religious Services:   . Active Member of Clubs or Organizations:   . Attends Archivist Meetings:   Marland Kitchen Marital Status:   Intimate Partner Violence:   . Fear of Current or Ex-Partner:   . Emotionally Abused:   Marland Kitchen Physically Abused:   . Sexually Abused:     Outpatient Medications Prior to Visit  Medication Sig Dispense Refill  . cetirizine (ZYRTEC) 10 MG tablet Take 10 mg by mouth daily.    . ergocalciferol (VITAMIN D2) 1.25 MG (50000 UT) capsule Take 5,000 Units by mouth 4 (four) times a week.    Marland Kitchen alendronate (FOSAMAX) 70 MG tablet TAKE 1 TABLET (70 MG TOTAL) BY MOUTH EVERY 7 (SEVEN) DAYS. TAKE WITH A FULL GLASS OF WATER ON AN EMPTY STOMACH. 12 tablet 1  . atorvastatin (LIPITOR) 10 MG tablet Take 1 tablet (10 mg  total) by mouth daily. 90 tablet 3  . fluticasone (FLONASE) 50 MCG/ACT nasal spray Place 2 sprays into both nostrils daily. (Patient not taking: Reported on 05/12/2019) 16 g 1  . FOLIC ACID PO Take by mouth.    Javier Docker Oil 1000 MG CAPS Take 1,000 mg by mouth daily.    Marland Kitchen levothyroxine (SYNTHROID, LEVOTHROID) 150 MCG tablet Take 1 tablet (150 mcg total) by mouth daily before breakfast. 90 tablet 1  . lisinopril (PRINIVIL,ZESTRIL) 10 MG tablet Take 1 tablet (10 mg total) by mouth daily. 90 tablet 3  . Multiple Vitamin (MULTIVITAMIN) tablet Take 1 tablet by mouth daily.    . TURMERIC PO Take by mouth.     No facility-administered medications prior to visit.    Allergies  Allergen Reactions  . Codeine Nausea Only  . Iodine Rash  . Mercurochrome [Merbromin] Rash  . Sulfa Antibiotics Rash    Review of Systems  Constitutional: Positive for malaise/fatigue. Negative for fever.  HENT: Negative for congestion.   Eyes: Negative for blurred vision.  Respiratory: Negative for shortness of breath.   Cardiovascular: Negative for chest pain, palpitations and leg swelling.  Gastrointestinal: Negative for abdominal pain, blood in stool and nausea.  Genitourinary: Negative for dysuria and frequency.  Musculoskeletal: Negative for falls.  Skin: Negative for rash.  Neurological: Negative for dizziness, loss of consciousness and headaches.  Endo/Heme/Allergies: Negative for environmental allergies.  Psychiatric/Behavioral: Negative for depression. The patient is not nervous/anxious.        Objective:    Physical Exam Vitals and nursing note reviewed.  Constitutional:      General: She is not in acute distress.    Appearance: She is well-developed.  HENT:     Head: Normocephalic and atraumatic.     Nose: Nose normal.  Eyes:     General:        Right eye: No discharge.        Left eye: No discharge.  Cardiovascular:     Rate and Rhythm: Normal rate and regular rhythm.     Heart sounds: No  murmur.  Pulmonary:     Effort: Pulmonary effort is normal.     Breath sounds: Normal breath sounds.  Abdominal:     General: Bowel sounds are normal.     Palpations: Abdomen is soft.  Tenderness: There is no abdominal tenderness.  Musculoskeletal:     Cervical back: Normal range of motion and neck supple.  Skin:    General: Skin is warm and dry.  Neurological:     Mental Status: She is alert and oriented to person, place, and time.     BP (!) 113/56 (BP Location: Left Arm, Cuff Size: Normal)   Pulse 80   Temp (!) 96.5 F (35.8 C) (Temporal)   Resp 12   Ht 5\' 5"  (1.651 m)   Wt 160 lb 12.8 oz (72.9 kg)   SpO2 98%   BMI 26.76 kg/m  Wt Readings from Last 3 Encounters:  11/13/19 160 lb 11.5 oz (72.9 kg)  11/13/19 160 lb 12.8 oz (72.9 kg)  08/29/19 158 lb (71.7 kg)    Diabetic Foot Exam - Simple   No data filed     Lab Results  Component Value Date   WBC 8.1 11/13/2019   HGB 13.7 11/13/2019   HCT 39.9 11/13/2019   PLT 327.0 11/13/2019   GLUCOSE 112 (H) 11/13/2019   CHOL 153 11/13/2019   TRIG 176.0 (H) 11/13/2019   HDL 62.80 11/13/2019   LDLCALC 55 11/13/2019   ALT 21 11/13/2019   AST 19 11/13/2019   NA 138 11/13/2019   K 4.7 11/13/2019   CL 102 11/13/2019   CREATININE 0.80 11/13/2019   BUN 17 11/13/2019   CO2 29 11/13/2019   TSH 2.83 11/13/2019   HGBA1C 5.3 05/22/2019    Lab Results  Component Value Date   TSH 2.83 11/13/2019   Lab Results  Component Value Date   WBC 8.1 11/13/2019   HGB 13.7 11/13/2019   HCT 39.9 11/13/2019   MCV 94.3 11/13/2019   PLT 327.0 11/13/2019   Lab Results  Component Value Date   NA 138 11/13/2019   K 4.7 11/13/2019   CO2 29 11/13/2019   GLUCOSE 112 (H) 11/13/2019   BUN 17 11/13/2019   CREATININE 0.80 11/13/2019   BILITOT 0.5 11/13/2019   ALKPHOS 57 11/13/2019   AST 19 11/13/2019   ALT 21 11/13/2019   PROT 7.4 11/13/2019   ALBUMIN 4.6 11/13/2019   CALCIUM 10.2 11/13/2019   GFR 70.41 11/13/2019   Lab  Results  Component Value Date   CHOL 153 11/13/2019   Lab Results  Component Value Date   HDL 62.80 11/13/2019   Lab Results  Component Value Date   LDLCALC 55 11/13/2019   Lab Results  Component Value Date   TRIG 176.0 (H) 11/13/2019   Lab Results  Component Value Date   CHOLHDL 2 11/13/2019   Lab Results  Component Value Date   HGBA1C 5.3 05/22/2019       Assessment & Plan:   Problem List Items Addressed This Visit    HTN (hypertension)    Monitor and report and monitor any concerns.  no changes to meds. Encouraged heart healthy diet such as the DASH diet and exercise as tolerated.       Relevant Medications   lisinopril (ZESTRIL) 10 MG tablet   atorvastatin (LIPITOR) 10 MG tablet   Other Relevant Orders   CBC (Completed)   Comprehensive metabolic panel (Completed)   TSH (Completed)   Hyperlipidemia    Encouraged heart healthy diet, increase exercise, avoid trans fats, consider a krill oil cap daily      Relevant Medications   lisinopril (ZESTRIL) 10 MG tablet   atorvastatin (LIPITOR) 10 MG tablet   Other Relevant Orders  Lipid panel (Completed)   Hypothyroidism    On Levothyroxine, continue to monitor      Relevant Medications   levothyroxine (SYNTHROID) 150 MCG tablet   Other Relevant Orders   TSH (Completed)   Hyperglycemia    hgba1c acceptable, minimize simple carbs. Increase exercise as tolerated.      Vitamin D deficiency    Monitor and supplement      Relevant Orders   VITAMIN D 25 Hydroxy (Vit-D Deficiency, Fractures) (Completed)      I have discontinued Casara L. Porco's multivitamin, Krill Oil, fluticasone, TURMERIC PO, FOLIC ACID PO, and alendronate. I have also changed her lisinopril and levothyroxine. Additionally, I am having her maintain her cetirizine, ergocalciferol, and atorvastatin.  Meds ordered this encounter  Medications  . lisinopril (ZESTRIL) 10 MG tablet    Sig: Take 1 tablet (10 mg total) by mouth daily.     Dispense:  90 tablet    Refill:  1  . atorvastatin (LIPITOR) 10 MG tablet    Sig: Take 1 tablet (10 mg total) by mouth daily.    Dispense:  90 tablet    Refill:  1  . levothyroxine (SYNTHROID) 150 MCG tablet    Sig: Take 1 tablet (150 mcg total) by mouth daily before breakfast.    Dispense:  90 tablet    Refill:  1     Penni Homans, MD

## 2019-11-13 NOTE — Patient Instructions (Signed)
Please schedule your next medicare wellness visit with me in 1 yr.  Continue to eat heart healthy diet (full of fruits, vegetables, whole grains, lean protein, water--limit salt, fat, and sugar intake) and increase physical activity as tolerated.  Continue doing brain stimulating activities (puzzles, reading, adult coloring books, staying active) to keep memory sharp.    Michelle Jacobs , Thank you for taking time to come for your Medicare Wellness Visit. I appreciate your ongoing commitment to your health goals. Please review the following plan we discussed and let me know if I can assist you in the future.   These are the goals we discussed: Goals    . Continue to eat healthy and exercise    . increase sleep        This is a list of the screening recommended for you and due dates:  Health Maintenance  Topic Date Due  . Mammogram  11/06/2020  . Tetanus Vaccine  09/02/2021  . Colon Cancer Screening  09/26/2024  . Flu Shot  Completed  . DEXA scan (bone density measurement)  Completed  .  Hepatitis C: One time screening is recommended by Center for Disease Control  (CDC) for  adults born from [birth year] through 1965.   Completed  . Pneumonia vaccines  Completed    Preventive Care 31 Years and Older, Female Preventive care refers to lifestyle choices and visits with your health care provider that can promote health and wellness. This includes:  A yearly physical exam. This is also called an annual well check.  Regular dental and eye exams.  Immunizations.  Screening for certain conditions.  Healthy lifestyle choices, such as diet and exercise. What can I expect for my preventive care visit? Physical exam Your health care provider will check:  Height and weight. These may be used to calculate body mass index (BMI), which is a measurement that tells if you are at a healthy weight.  Heart rate and blood pressure.  Your skin for abnormal spots. Counseling Your health care provider  may ask you questions about:  Alcohol, tobacco, and drug use.  Emotional well-being.  Home and relationship well-being.  Sexual activity.  Eating habits.  History of falls.  Memory and ability to understand (cognition).  Work and work Statistician.  Pregnancy and menstrual history. What immunizations do I need?  Influenza (flu) vaccine  This is recommended every year. Tetanus, diphtheria, and pertussis (Tdap) vaccine  You may need a Td booster every 10 years. Varicella (chickenpox) vaccine  You may need this vaccine if you have not already been vaccinated. Zoster (shingles) vaccine  You may need this after age 67. Pneumococcal conjugate (PCV13) vaccine  One dose is recommended after age 17. Pneumococcal polysaccharide (PPSV23) vaccine  One dose is recommended after age 52. Measles, mumps, and rubella (MMR) vaccine  You may need at least one dose of MMR if you were born in 1957 or later. You may also need a second dose. Meningococcal conjugate (MenACWY) vaccine  You may need this if you have certain conditions. Hepatitis A vaccine  You may need this if you have certain conditions or if you travel or work in places where you may be exposed to hepatitis A. Hepatitis B vaccine  You may need this if you have certain conditions or if you travel or work in places where you may be exposed to hepatitis B. Haemophilus influenzae type b (Hib) vaccine  You may need this if you have certain conditions. You may receive vaccines  as individual doses or as more than one vaccine together in one shot (combination vaccines). Talk with your health care provider about the risks and benefits of combination vaccines. What tests do I need? Blood tests  Lipid and cholesterol levels. These may be checked every 5 years, or more frequently depending on your overall health.  Hepatitis C test.  Hepatitis B test. Screening  Lung cancer screening. You may have this screening every year  starting at age 43 if you have a 30-pack-year history of smoking and currently smoke or have quit within the past 15 years.  Colorectal cancer screening. All adults should have this screening starting at age 77 and continuing until age 20. Your health care provider may recommend screening at age 27 if you are at increased risk. You will have tests every 1-10 years, depending on your results and the type of screening test.  Diabetes screening. This is done by checking your blood sugar (glucose) after you have not eaten for a while (fasting). You may have this done every 1-3 years.  Mammogram. This may be done every 1-2 years. Talk with your health care provider about how often you should have regular mammograms.  BRCA-related cancer screening. This may be done if you have a family history of breast, ovarian, tubal, or peritoneal cancers. Other tests  Sexually transmitted disease (STD) testing.  Bone density scan. This is done to screen for osteoporosis. You may have this done starting at age 21. Follow these instructions at home: Eating and drinking  Eat a diet that includes fresh fruits and vegetables, whole grains, lean protein, and low-fat dairy products. Limit your intake of foods with high amounts of sugar, saturated fats, and salt.  Take vitamin and mineral supplements as recommended by your health care provider.  Do not drink alcohol if your health care provider tells you not to drink.  If you drink alcohol: ? Limit how much you have to 0-1 drink a day. ? Be aware of how much alcohol is in your drink. In the [place] one drink equals one 12 oz bottle of beer (355 mL), one 5 oz glass of wine (148 mL), or one 1 oz glass of hard liquor (44 mL). Lifestyle  Take daily care of your teeth and gums.  Stay active. Exercise for at least 30 minutes on 5 or more days each week.  Do not use any products that contain nicotine or tobacco, such as cigarettes, e-cigarettes, and chewing tobacco. If  you need help quitting, ask your health care provider.  If you are sexually active, practice safe sex. Use a condom or other form of protection in order to prevent STIs (sexually transmitted infections).  Talk with your health care provider about taking a low-dose aspirin or statin. What's next?  Go to your health care provider once a year for a well check visit.  Ask your health care provider how often you should have your eyes and teeth checked.  Stay up to date on all vaccines. This information is not intended to replace advice given to you by your health care provider. Make sure you discuss any questions you have with your health care provider. Document Revised: 08/11/2018 Document Reviewed: 08/11/2018 Elsevier Patient Education  2020 [employer].

## 2019-11-13 NOTE — Assessment & Plan Note (Signed)
On Levothyroxine, continue to monitor 

## 2019-11-13 NOTE — Assessment & Plan Note (Signed)
Monitor and report and monitor any concerns.  no changes to meds. Encouraged heart healthy diet such as the DASH diet and exercise as tolerated.

## 2019-11-13 NOTE — Patient Instructions (Signed)
[employer] Blood Pressure cuff, upper arm, want BP 100-140/60-90 Pulse oximeter, want oxygen in 90s  Weekly vitals  Take Multivitamin with minerals, selenium Vitamin D 1000-2000 IU daily Probiotic with lactobacillus and bifidophilus Asprin EC 81 mg daily  Melatonin 2-5 mg at bedtime  [email or web address] 

## 2019-11-13 NOTE — Assessment & Plan Note (Signed)
hgba1c acceptable, minimize simple carbs. Increase exercise as tolerated.  

## 2019-11-13 NOTE — Assessment & Plan Note (Signed)
Monitor and supplement 

## 2019-11-14 ENCOUNTER — Other Ambulatory Visit (INDEPENDENT_AMBULATORY_CARE_PROVIDER_SITE_OTHER): Payer: Medicare HMO

## 2019-11-14 DIAGNOSIS — R739 Hyperglycemia, unspecified: Secondary | ICD-10-CM | POA: Diagnosis not present

## 2019-11-14 LAB — HEMOGLOBIN A1C: Hgb A1c MFr Bld: 5.1 % (ref 4.6–6.5)

## 2019-11-28 DIAGNOSIS — Z0100 Encounter for examination of eyes and vision without abnormal findings: Secondary | ICD-10-CM | POA: Diagnosis not present

## 2019-12-13 ENCOUNTER — Telehealth: Payer: Self-pay | Admitting: Family Medicine

## 2019-12-13 NOTE — Telephone Encounter (Signed)
I will send in other refills but for vitamin D 50,000 do you want to continue?  Last checked 11/13/19 and it was 35.95.

## 2019-12-13 NOTE — Telephone Encounter (Signed)
  Caller : [name]  Call Back # [phone]  Subject : [facility]  Mail Order Fax Number # [phone]  Patient is requesting that a prescription for all of her medications be sent to [place].  Medications:    atorvastatin (LIPITOR) 10 MG tablet cetirizine (ZYRTEC) 10 MG tablet ergocalciferol (VITAMIN D2) 1.25 MG (50000 UT) capsule levothyroxine (SYNTHROID) 150 MCG tablet lisinopril (ZESTRIL) 10 MG tablet

## 2019-12-13 NOTE — Telephone Encounter (Signed)
We will need to clarify with patient what she is actually taking. I think she is taking vit D 5000 IU 4 days a week and not taking the 50000 IU weekly. Confirm with her and then update MAR. Her Vitamin D number was normal so what ever she is doing is fine to continue.

## 2019-12-14 MED ORDER — LISINOPRIL 10 MG PO TABS: 10.0000 mg | ORAL_TABLET | Freq: Every day | ORAL | 1 refills | Status: DC

## 2019-12-14 MED ORDER — ATORVASTATIN CALCIUM 10 MG PO TABS: 10.0000 mg | ORAL_TABLET | Freq: Every day | ORAL | 1 refills | Status: DC

## 2019-12-14 MED ORDER — LEVOTHYROXINE SODIUM 150 MCG PO TABS: 150.0000 ug | ORAL_TABLET | Freq: Every day | ORAL | 1 refills | Status: DC

## 2019-12-14 NOTE — Telephone Encounter (Signed)
Patient only needed  Atorvastatin, levothyroxine and lisinopril.  Rxs sent in.  Updated her vitamin d. She takes 5000 IU 4 times a week.

## 2020-02-21 ENCOUNTER — Telehealth: Payer: Self-pay

## 2020-02-21 NOTE — Telephone Encounter (Signed)
Patient called in to speak with the nurse about getting a Prolia shot for this month. Per the patient she is suppose to get a shot every 6 months. Please give the patient a call at [phone]

## 2020-02-21 NOTE — Telephone Encounter (Signed)
Looks like she last had her shot back on 08/15/19.  Can you review when you can please.   I did make her aware that you were out of the office this week.

## 2020-02-27 NOTE — Telephone Encounter (Signed)
Prior authorization required-sent prior auth to [place]. Waiting on reponse approval/denial.

## 2020-02-28 NOTE — Telephone Encounter (Signed)
Prolia approved 02/27/2020-02/26/2021 authorization number #[id number]

## 2020-02-29 ENCOUNTER — Ambulatory Visit (INDEPENDENT_AMBULATORY_CARE_PROVIDER_SITE_OTHER): Payer: Medicare HMO

## 2020-02-29 ENCOUNTER — Other Ambulatory Visit: Payer: Self-pay

## 2020-02-29 DIAGNOSIS — M810 Age-related osteoporosis without current pathological fracture: Secondary | ICD-10-CM | POA: Diagnosis not present

## 2020-02-29 MED ORDER — DENOSUMAB 60 MG/ML ~~LOC~~ SOSY
60.0000 mg | PREFILLED_SYRINGE | Freq: Once | SUBCUTANEOUS | Status: AC
  Administered 2020-02-29: 60 mg via SUBCUTANEOUS

## 2020-02-29 NOTE — Progress Notes (Signed)
Michelle Jacobs is a 74 y.o. female presents to the office today for Prolia injection, last injection was done on 12/15/ 2020.  Prolia 60 mg/ml administered on  today. Patient tolerated injection. Patient due for follow up labs/provider appt:  Date due: 05-20-20, Patient next injection due: in 6 months.  Jiles Prows

## 2020-04-04 DIAGNOSIS — R69 Illness, unspecified: Secondary | ICD-10-CM | POA: Diagnosis not present

## 2020-05-20 ENCOUNTER — Ambulatory Visit (INDEPENDENT_AMBULATORY_CARE_PROVIDER_SITE_OTHER): Payer: Medicare HMO | Admitting: Family Medicine

## 2020-05-20 ENCOUNTER — Encounter: Payer: Self-pay | Admitting: Family Medicine

## 2020-05-20 ENCOUNTER — Other Ambulatory Visit: Payer: Self-pay

## 2020-05-20 VITALS — BP 112/62 | HR 72 | Temp 97.9°F | Resp 13 | Ht 63.0 in | Wt 162.2 lb

## 2020-05-20 DIAGNOSIS — M810 Age-related osteoporosis without current pathological fracture: Secondary | ICD-10-CM

## 2020-05-20 DIAGNOSIS — E039 Hypothyroidism, unspecified: Secondary | ICD-10-CM

## 2020-05-20 DIAGNOSIS — R739 Hyperglycemia, unspecified: Secondary | ICD-10-CM | POA: Diagnosis not present

## 2020-05-20 DIAGNOSIS — Z0000 Encounter for general adult medical examination without abnormal findings: Secondary | ICD-10-CM

## 2020-05-20 DIAGNOSIS — Z23 Encounter for immunization: Secondary | ICD-10-CM

## 2020-05-20 DIAGNOSIS — E559 Vitamin D deficiency, unspecified: Secondary | ICD-10-CM | POA: Diagnosis not present

## 2020-05-20 DIAGNOSIS — I10 Essential (primary) hypertension: Secondary | ICD-10-CM | POA: Diagnosis not present

## 2020-05-20 NOTE — Assessment & Plan Note (Signed)
minimize simple carbs. Increase exercise as tolerated. Continue current meds  

## 2020-05-20 NOTE — Assessment & Plan Note (Signed)
Well controlled, no changes to meds. Encouraged heart healthy diet such as the DASH diet and exercise as tolerated.  °

## 2020-05-20 NOTE — Progress Notes (Signed)
Subjective:    Patient ID: Michelle Jacobs, female    DOB: 1946-03-19, 74 y.o.   MRN: [id number]  Chief Complaint  Patient presents with  . Annual Exam    nonfasting    HPI Patient is in today for annual preventative exam and follow up on chronic medical concerns. No recent febrile illness or hospitalizations. 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 Denies CP/palp/SOB/HA/congestion/fevers/GI or GU c/o. Taking meds as prescribed  Past Medical History:  Diagnosis Date  . Allergy   . Annual physical exam 10/06/2015  . GERD (gastroesophageal reflux disease)    mild  . History of chicken pox   . Hyperglycemia 05/20/2013  . Hyperlipidemia   . Hypertension   . Medicare annual wellness visit, subsequent 12/02/2011  . Personal history of colonic polyps 2009   Benign colon polyps  . Preventative health care 12/02/2011  . Verrucous skin lesion 11/04/2016    History reviewed. No pertinent surgical history.  Family History  Problem Relation Age of Onset  . Cancer Mother        lung  . Hypertension Mother   . Heart disease Father        CHF  . Diabetes Maternal Uncle        type II  . Heart disease Maternal Uncle        s/p CABG  . Cancer Maternal Grandmother 17       breast  . Heart disease Maternal Grandmother        MI, CHF  . Obesity Maternal Grandmother   . Cancer Sister        uterine  . Hypertension Sister   . Hyperlipidemia Brother   . Hypertension Brother   . Cancer Brother        HPV associated oral cancer  . Hearing loss Brother   . Kidney disease Maternal Grandfather        kidney failure  . Cancer Paternal Grandmother   . COPD Paternal Grandfather   . Diabetes Maternal Aunt        older, type 2    Social History   Socioeconomic History  . Marital status: Married    Spouse name: Not on file  . Number of children: 2  . Years of education: Not on file  . Highest education level: Not on file    Occupational History    Employer: [email or web address]  Tobacco Use  . Smoking status: Never Smoker  . Smokeless tobacco: Never Used  Substance and Sexual Activity  . Alcohol use: Yes    Alcohol/week: 10.0 standard drinks    Types: 10 Glasses of wine per week  . Drug use: No  . Sexual activity: Not on file    Comment: lives with husband, no dietary restrictions  Other Topics Concern  . Not on file  Social History Narrative   Caffeine use:  2-3 cups coffee daily   Regular exercise:  Walks daily         Social Determinants of Health   Financial Resource Strain: Low Risk   . Difficulty of Paying Living Expenses: Not hard at all  Food Insecurity: No Food Insecurity  . Worried About Charity fundraiser in the Last Year: Never true  . Ran Out of Food in the Last Year: Never true  Transportation Needs: No Transportation Needs  . Lack of Transportation (Medical): No  . Lack of Transportation (Non-Medical): No  Physical Activity:   . Days of Exercise per Week: Not on file  . Minutes of Exercise per Session: Not on file  Stress:   . Feeling of Stress : Not on file  Social Connections:   . Frequency of Communication with Friends and Family: Not on file  . Frequency of Social Gatherings with Friends and Family: Not on file  . Attends Religious Services: Not on file  . Active Member of Clubs or Organizations: Not on file  . Attends Archivist Meetings: Not on file  . Marital Status: Not on file  Intimate Partner Violence:   . Fear of Current or Ex-Partner: Not on file  . Emotionally Abused: Not on file  . Physically Abused: Not on file  . Sexually Abused: Not on file    Outpatient Medications Prior to Visit  Medication Sig Dispense Refill  . atorvastatin (LIPITOR) 10 MG tablet Take 1 tablet (10 mg total) by mouth daily. 90 tablet 1  . cetirizine (ZYRTEC) 10 MG tablet Take 10 mg by mouth daily.    . Cholecalciferol (VITAMIN D3) 125 MCG (5000 UT) CAPS Take by  mouth. 1 cap 4 times a week.    . levothyroxine (SYNTHROID) 150 MCG tablet Take 1 tablet (150 mcg total) by mouth daily before breakfast. 90 tablet 1  . lisinopril (ZESTRIL) 10 MG tablet Take 1 tablet (10 mg total) by mouth daily. 90 tablet 1   No facility-administered medications prior to visit.    Allergies  Allergen Reactions  . Codeine Nausea Only  . Iodine Rash  . Mercurochrome [Merbromin] Rash  . Sulfa Antibiotics Rash    Review of Systems  Constitutional: Negative for fever and malaise/fatigue.  HENT: Negative for congestion.   Eyes: Negative for blurred vision.  Respiratory: Negative for shortness of breath.   Cardiovascular: Negative for chest pain, palpitations and leg swelling.  Gastrointestinal: Negative for abdominal pain, blood in stool and nausea.  Genitourinary: Negative for dysuria and frequency.  Musculoskeletal: Negative for falls.  Skin: Negative for rash.  Neurological: Negative for dizziness, loss of consciousness and headaches.  Endo/Heme/Allergies: Negative for environmental allergies.  Psychiatric/Behavioral: Negative for depression. The patient is not nervous/anxious.        Objective:    Physical Exam Vitals and nursing note reviewed.  Constitutional:      General: She is not in acute distress.    Appearance: She is well-developed.  HENT:     Head: Normocephalic and atraumatic.     Nose: Nose normal.  Eyes:     General:        Right eye: No discharge.        Left eye: No discharge.  Cardiovascular:     Rate and Rhythm: Normal rate and regular rhythm.     Heart sounds: No murmur heard.   Pulmonary:     Effort: Pulmonary effort is normal.     Breath sounds: Normal breath sounds.  Abdominal:     General: Bowel sounds are normal.     Palpations: Abdomen is soft.     Tenderness: There is no abdominal tenderness.  Musculoskeletal:     Cervical back: Normal range of motion and neck supple.  Skin:    General: Skin is warm and dry.    Neurological:     Mental Status: She is alert and oriented to person, place, and time.     BP 112/62 (BP Location: Right Arm, Patient Position: Sitting, Cuff Size: Large)   Pulse 72  Temp 97.9 F (36.6 C) (Oral)   Resp 13   Ht 5\' 3"  (1.6 m)   Wt 162 lb 3.2 oz (73.6 kg)   SpO2 96%   BMI 28.73 kg/m  Wt Readings from Last 3 Encounters:  05/20/20 162 lb 3.2 oz (73.6 kg)  11/13/19 160 lb 11.5 oz (72.9 kg)  11/13/19 160 lb 12.8 oz (72.9 kg)    Diabetic Foot Exam - Simple   No data filed     Lab Results  Component Value Date   WBC 8.1 05/20/2020   HGB 13.2 05/20/2020   HCT 38.4 05/20/2020   PLT 294 05/20/2020   GLUCOSE 87 05/20/2020   CHOL 151 05/20/2020   TRIG 200 (H) 05/20/2020   HDL 58 05/20/2020   LDLCALC 66 05/20/2020   ALT 31 (H) 05/20/2020   AST 25 05/20/2020   NA 136 05/20/2020   K 5.1 05/20/2020   CL 101 05/20/2020   CREATININE 0.84 05/20/2020   BUN 17 05/20/2020   CO2 27 05/20/2020   TSH 1.98 05/20/2020   HGBA1C 5.3 05/20/2020    Lab Results  Component Value Date   TSH 1.98 05/20/2020   Lab Results  Component Value Date   WBC 8.1 05/20/2020   HGB 13.2 05/20/2020   HCT 38.4 05/20/2020   MCV 94.3 05/20/2020   PLT 294 05/20/2020   Lab Results  Component Value Date   NA 136 05/20/2020   K 5.1 05/20/2020   CO2 27 05/20/2020   GLUCOSE 87 05/20/2020   BUN 17 05/20/2020   CREATININE 0.84 05/20/2020   BILITOT 0.7 05/20/2020   ALKPHOS 57 11/13/2019   AST 25 05/20/2020   ALT 31 (H) 05/20/2020   PROT 7.2 05/20/2020   ALBUMIN 4.6 11/13/2019   CALCIUM 10.3 05/20/2020   GFR 70.41 11/13/2019   Lab Results  Component Value Date   CHOL 151 05/20/2020   Lab Results  Component Value Date   HDL 58 05/20/2020   Lab Results  Component Value Date   LDLCALC 66 05/20/2020   Lab Results  Component Value Date   TRIG 200 (H) 05/20/2020   Lab Results  Component Value Date   CHOLHDL 2.6 05/20/2020   Lab Results  Component Value Date   HGBA1C  5.3 05/20/2020       Assessment & Plan:   Problem List Items Addressed This Visit    Osteoporosis (Chronic)    Encouraged to get adequate exercise, calcium and vitamin d intake      HTN (hypertension)    Well controlled, no changes to meds. Encouraged heart healthy diet such as the DASH diet and exercise as tolerated.       Relevant Orders   CBC (Completed)   Comprehensive metabolic panel (Completed)   TSH (Completed)   Hypothyroidism    On Levothyroxine, continue to monitor      Hyperglycemia    minimize simple carbs. Increase exercise as tolerated. Continue current meds      Relevant Orders   Hemoglobin A1c (Completed)   Lipid panel (Completed)   Annual physical exam    Patient encouraged to maintain heart healthy diet, regular exercise, adequate sleep. Consider daily probiotics. Take medications as prescribed. Labs ordered and reviewed. Colonoscopy 2016 due 2026. MGM due in next 6 months. High dose flu shot given      Vitamin D deficiency    Supplement and monitor      Relevant Orders   VITAMIN D 25 Hydroxy (Vit-D Deficiency, Fractures) (  Completed)    Other Visit Diagnoses    Influenza vaccine needed    -  Primary   Relevant Orders   Flu Vaccine QUAD High Dose(Fluad) (Completed)      I am having Kimbree L. Gambone maintain her cetirizine, atorvastatin, levothyroxine, lisinopril, and Vitamin D3.  No orders of the defined types were placed in this encounter.    Penni Homans, MD

## 2020-05-20 NOTE — Assessment & Plan Note (Signed)
On Levothyroxine, continue to monitor 

## 2020-05-20 NOTE — Assessment & Plan Note (Signed)
Encouraged to get adequate exercise, calcium and vitamin d intake 

## 2020-05-20 NOTE — Assessment & Plan Note (Signed)
Supplement and monitor 

## 2020-05-20 NOTE — Patient Instructions (Signed)

## 2020-05-21 LAB — LIPID PANEL
Cholesterol: 151 mg/dL (ref <200)
HDL: 58 mg/dL (ref >=50)
LDL Cholesterol (Calc): 66 mg/dL (calc)
Non-HDL Cholesterol (Calc): 93 mg/dL (calc) (ref <130)
Total CHOL/HDL Ratio: 2.6 (calc) (ref <5.0)
Triglycerides: 200 mg/dL — ABNORMAL HIGH (ref <150)

## 2020-05-21 LAB — CBC
HCT: 38.4 % (ref 35.0–45.0)
Hemoglobin: 13.2 g/dL (ref 11.7–15.5)
MCH: 32.4 pg (ref 27.0–33.0)
MCHC: 34.4 g/dL (ref 32.0–36.0)
MCV: 94.3 fL (ref 80.0–100.0)
MPV: 8.8 fL (ref 7.5–12.5)
Platelets: 294 10*3/uL (ref 140–400)
RBC: 4.07 10*6/uL (ref 3.80–5.10)
RDW: 12.2 % (ref 11.0–15.0)
WBC: 8.1 10*3/uL (ref 3.8–10.8)

## 2020-05-21 LAB — COMPREHENSIVE METABOLIC PANEL
AG Ratio: 1.8 (calc) (ref 1.0–2.5)
ALT: 31 U/L — ABNORMAL HIGH (ref 6–29)
AST: 25 U/L (ref 10–35)
Albumin: 4.6 g/dL (ref 3.6–5.1)
Alkaline phosphatase (APISO): 51 U/L (ref 37–153)
BUN: 17 mg/dL (ref 7–25)
CO2: 27 mmol/L (ref 20–32)
Calcium: 10.3 mg/dL (ref 8.6–10.4)
Chloride: 101 mmol/L (ref 98–110)
Creat: 0.84 mg/dL (ref 0.60–0.93)
Globulin: 2.6 g/dL (calc) (ref 1.9–3.7)
Glucose, Bld: 87 mg/dL (ref 65–99)
Potassium: 5.1 mmol/L (ref 3.5–5.3)
Sodium: 136 mmol/L (ref 135–146)
Total Bilirubin: 0.7 mg/dL (ref 0.2–1.2)
Total Protein: 7.2 g/dL (ref 6.1–8.1)

## 2020-05-21 LAB — HEMOGLOBIN A1C
Hgb A1c MFr Bld: 5.3 % of total Hgb (ref <5.7)
Mean Plasma Glucose: 105 (calc)
eAG (mmol/L): 5.8 (calc)

## 2020-05-21 LAB — TSH: TSH: 1.98 mIU/L (ref 0.40–4.50)

## 2020-05-21 LAB — VITAMIN D 25 HYDROXY (VIT D DEFICIENCY, FRACTURES): Vit D, 25-Hydroxy: 32 ng/mL (ref 30–100)

## 2020-05-21 NOTE — Assessment & Plan Note (Signed)
Patient encouraged to maintain heart healthy diet, regular exercise, adequate sleep. Consider daily probiotics. Take medications as prescribed. Labs ordered and reviewed. Colonoscopy 2016 due 2026. MGM due in next 6 months. High dose flu shot given

## 2020-05-28 ENCOUNTER — Other Ambulatory Visit (HOSPITAL_BASED_OUTPATIENT_CLINIC_OR_DEPARTMENT_OTHER): Payer: Self-pay | Admitting: Internal Medicine

## 2020-05-28 ENCOUNTER — Ambulatory Visit: Payer: Medicare HMO | Attending: Internal Medicine

## 2020-05-28 DIAGNOSIS — Z23 Encounter for immunization: Secondary | ICD-10-CM

## 2020-05-31 MED FILL — PFIZER-BIONTECH COVID-19 VA: 30 | 1 days supply | Qty: 0 | Fill #0

## 2020-07-09 ENCOUNTER — Other Ambulatory Visit: Payer: Self-pay | Admitting: Family Medicine

## 2020-10-02 ENCOUNTER — Ambulatory Visit (INDEPENDENT_AMBULATORY_CARE_PROVIDER_SITE_OTHER): Payer: Medicare HMO

## 2020-10-02 ENCOUNTER — Other Ambulatory Visit: Payer: Self-pay

## 2020-10-02 DIAGNOSIS — M810 Age-related osteoporosis without current pathological fracture: Secondary | ICD-10-CM | POA: Diagnosis not present

## 2020-10-02 MED ORDER — DENOSUMAB 60 MG/ML ~~LOC~~ SOSY
60.0000 mg | PREFILLED_SYRINGE | Freq: Once | SUBCUTANEOUS | Status: AC
  Administered 2020-10-02: 60 mg via SUBCUTANEOUS

## 2020-10-02 NOTE — Progress Notes (Addendum)
Pt is here today for prolia injection. Pt was given prolia injection in left arm subq. Pt tolerated well.   Pt is scheduled for 04/03/21 for next prolia injection

## 2020-12-03 ENCOUNTER — Ambulatory Visit (HOSPITAL_BASED_OUTPATIENT_CLINIC_OR_DEPARTMENT_OTHER)
Admission: RE | Admit: 2020-12-03 | Discharge: 2020-12-03 | Disposition: A | Discharge status: Home / Self Care | Payer: Medicare HMO | Source: Ambulatory Visit | Attending: Family Medicine | Admitting: Family Medicine

## 2020-12-03 ENCOUNTER — Encounter: Payer: Self-pay | Admitting: Family Medicine

## 2020-12-03 ENCOUNTER — Ambulatory Visit (INDEPENDENT_AMBULATORY_CARE_PROVIDER_SITE_OTHER): Payer: Medicare HMO | Admitting: Family Medicine

## 2020-12-03 ENCOUNTER — Other Ambulatory Visit: Payer: Self-pay

## 2020-12-03 ENCOUNTER — Encounter (HOSPITAL_BASED_OUTPATIENT_CLINIC_OR_DEPARTMENT_OTHER): Payer: Self-pay

## 2020-12-03 VITALS — BP 124/66 | HR 73 | Temp 98.3°F | Resp 16 | Ht 63.0 in | Wt 162.2 lb

## 2020-12-03 DIAGNOSIS — M810 Age-related osteoporosis without current pathological fracture: Secondary | ICD-10-CM | POA: Diagnosis not present

## 2020-12-03 DIAGNOSIS — E782 Mixed hyperlipidemia: Secondary | ICD-10-CM

## 2020-12-03 DIAGNOSIS — E039 Hypothyroidism, unspecified: Secondary | ICD-10-CM | POA: Diagnosis not present

## 2020-12-03 DIAGNOSIS — Z1239 Encounter for other screening for malignant neoplasm of breast: Secondary | ICD-10-CM

## 2020-12-03 DIAGNOSIS — I10 Essential (primary) hypertension: Secondary | ICD-10-CM | POA: Diagnosis not present

## 2020-12-03 DIAGNOSIS — Z1231 Encounter for screening mammogram for malignant neoplasm of breast: Secondary | ICD-10-CM | POA: Diagnosis not present

## 2020-12-03 DIAGNOSIS — E559 Vitamin D deficiency, unspecified: Secondary | ICD-10-CM

## 2020-12-03 DIAGNOSIS — R739 Hyperglycemia, unspecified: Secondary | ICD-10-CM

## 2020-12-03 LAB — COMPREHENSIVE METABOLIC PANEL
ALT: 28 U/L (ref 0–35)
AST: 24 U/L (ref 0–37)
Albumin: 4.7 g/dL (ref 3.5–5.2)
Alkaline Phosphatase: 42 U/L (ref 39–117)
BUN: 12 mg/dL (ref 6–23)
CO2: 28 mEq/L (ref 19–32)
Calcium: 9.8 mg/dL (ref 8.4–10.5)
Chloride: 102 mEq/L (ref 96–112)
Creatinine, Ser: 0.74 mg/dL (ref 0.40–1.20)
GFR: 80.19 mL/min (ref >60.00)
Glucose, Bld: 90 mg/dL (ref 70–99)
Potassium: 5.2 mEq/L — ABNORMAL HIGH (ref 3.5–5.1)
Sodium: 137 mEq/L (ref 135–145)
Total Bilirubin: 0.7 mg/dL (ref 0.2–1.2)
Total Protein: 7 g/dL (ref 6.0–8.3)

## 2020-12-03 LAB — TSH: TSH: 7.19 u[IU]/mL — ABNORMAL HIGH (ref 0.35–4.50)

## 2020-12-03 LAB — CBC WITH DIFFERENTIAL/PLATELET
Basophils Absolute: 0 10*3/uL (ref 0.0–0.1)
Basophils Relative: 0.9 % (ref 0.0–3.0)
Eosinophils Absolute: 0.2 10*3/uL (ref 0.0–0.7)
Eosinophils Relative: 3.9 % (ref 0.0–5.0)
HCT: 38.6 % (ref 36.0–46.0)
Hemoglobin: 13.7 g/dL (ref 12.0–15.0)
Lymphocytes Relative: 41.3 % (ref 12.0–46.0)
Lymphs Abs: 2.4 10*3/uL (ref 0.7–4.0)
MCHC: 35.4 g/dL (ref 30.0–36.0)
MCV: 93.6 fl (ref 78.0–100.0)
Monocytes Absolute: 0.6 10*3/uL (ref 0.1–1.0)
Monocytes Relative: 10.3 % (ref 3.0–12.0)
Neutro Abs: 2.5 10*3/uL (ref 1.4–7.7)
Neutrophils Relative %: 43.6 % (ref 43.0–77.0)
Platelets: 304 10*3/uL (ref 150.0–400.0)
RBC: 4.13 Mil/uL (ref 3.87–5.11)
RDW: 13.3 % (ref 11.5–15.5)
WBC: 5.8 10*3/uL (ref 4.0–10.5)

## 2020-12-03 LAB — LIPID PANEL
Cholesterol: 130 mg/dL (ref 0–200)
HDL: 55.2 mg/dL (ref >39.00)
LDL Cholesterol: 46 mg/dL (ref 0–99)
NonHDL: 74.69
Total CHOL/HDL Ratio: 2
Triglycerides: 141 mg/dL (ref 0.0–149.0)
VLDL: 28.2 mg/dL (ref 0.0–40.0)

## 2020-12-03 LAB — HEMOGLOBIN A1C: Hgb A1c MFr Bld: 5.3 % (ref 4.6–6.5)

## 2020-12-03 NOTE — Assessment & Plan Note (Signed)
Encouraged heart healthy diet, increase exercise, avoid trans fats, consider a krill oil cap daily 

## 2020-12-03 NOTE — Assessment & Plan Note (Signed)
On Levothyroxine, continue to monitor 

## 2020-12-03 NOTE — Progress Notes (Signed)
Patient ID: Michelle Jacobs, female    DOB: [birth year]  Age: 75 y.o. MRN: [id number]    Subjective:  Subjective  HPI Michelle Jacobs presents for presents for comprehensive physical exam today and follow up on management of chronic concerns. She states that she is doing well and reports no recent sicknesses or recent ER visits. She reports being on fosamax for 20 years, but switched to Prolia injections once every 6 months and she has had a total of 3 injections. She denies any sleeping issues, chest pain, SOB, fever, abdominal pain, cough, chills, sore throat, dysuria, urinary incontinence, back pain, HA, or N/VD.    Review of Systems  Constitutional: Negative for chills, fatigue and fever.  HENT: Negative for congestion, rhinorrhea, sinus pressure, sinus pain and sore throat.   Eyes: Negative for pain.  Respiratory: Negative for cough and shortness of breath.   Cardiovascular: Negative for chest pain, palpitations and leg swelling.  Gastrointestinal: Negative for abdominal pain, blood in stool, diarrhea, nausea and vomiting.  Genitourinary: Negative for decreased urine volume, flank pain, frequency, vaginal bleeding and vaginal discharge.  Musculoskeletal: Negative for back pain.  Neurological: Negative for headaches.    History Past Medical History:  Diagnosis Date  . Allergy   . Annual physical exam 10/06/2015  . GERD (gastroesophageal reflux disease)    mild  . History of chicken pox   . Hyperglycemia 05/20/2013  . Hyperlipidemia   . Hypertension   . Medicare annual wellness visit, subsequent 12/02/2011  . Personal history of colonic polyps 2009   Benign colon polyps  . Preventative health care 12/02/2011  . Verrucous skin lesion 11/04/2016    She has no past surgical history on file.   Her family history includes Breast cancer in her maternal grandmother; COPD in her paternal grandfather; Cancer in her brother, mother, paternal grandmother, and sister; Cancer (age of  onset: 72) in her maternal grandmother; Diabetes in her maternal aunt and maternal uncle; Hearing loss in her brother; Heart disease in her father, maternal grandmother, and maternal uncle; Hyperlipidemia in her brother; Hypertension in her brother, mother, and sister; Kidney disease in her maternal grandfather; Obesity in her maternal grandmother.She reports that she has never smoked. She has never used smokeless tobacco. She reports current alcohol use of about 10.0 standard drinks of alcohol per week. She reports that she does not use drugs.  Current Outpatient Medications on File Prior to Visit  Medication Sig Dispense Refill  . cetirizine (ZYRTEC) 10 MG tablet Take 10 mg by mouth daily.    . Cholecalciferol (VITAMIN D3) 125 MCG (5000 UT) CAPS Take by mouth. 1 cap 4 times a week.     No current facility-administered medications on file prior to visit.     Objective:  Objective  Physical Exam Constitutional:      General: She is not in acute distress.    Appearance: Normal appearance. She is not ill-appearing or toxic-appearing.  HENT:     Head: Normocephalic and atraumatic.     Right Ear: Tympanic membrane, ear canal and external ear normal.     Left Ear: Tympanic membrane, ear canal and external ear normal.     Nose: No congestion or rhinorrhea.  Eyes:     Extraocular Movements: Extraocular movements intact.     Pupils: Pupils are equal, round, and reactive to light.  Cardiovascular:     Rate and Rhythm: Normal rate and regular rhythm.     Pulses: Normal pulses.  Heart sounds: Normal heart sounds. No murmur heard.   Pulmonary:     Effort: Pulmonary effort is normal. No respiratory distress.     Breath sounds: Normal breath sounds. No wheezing, rhonchi or rales.  Abdominal:     General: Bowel sounds are normal.     Palpations: Abdomen is soft. There is no mass.     Tenderness: There is no abdominal tenderness. There is no guarding.     Hernia: No hernia is present.   Musculoskeletal:        General: Normal range of motion.     Cervical back: Normal range of motion and neck supple.  Skin:    General: Skin is warm and dry.  Neurological:     Mental Status: She is alert and oriented to person, place, and time.  Psychiatric:        Behavior: Behavior normal.    BP 124/66   Pulse 73   Temp 98.3 F (36.8 C)   Resp 16   Ht 5\' 3"  (1.6 m)   Wt 162 lb 3.2 oz (73.6 kg)   SpO2 95%   BMI 28.73 kg/m  Wt Readings from Last 3 Encounters:  12/03/20 162 lb 3.2 oz (73.6 kg)  05/20/20 162 lb 3.2 oz (73.6 kg)  11/13/19 160 lb 11.5 oz (72.9 kg)     Lab Results  Component Value Date   WBC 5.8 12/03/2020   HGB 13.7 12/03/2020   HCT 38.6 12/03/2020   PLT 304.0 12/03/2020   GLUCOSE 90 12/03/2020   CHOL 130 12/03/2020   TRIG 141.0 12/03/2020   HDL 55.20 12/03/2020   LDLCALC 46 12/03/2020   ALT 28 12/03/2020   AST 24 12/03/2020   NA 137 12/03/2020   K 5.2 No hemolysis seen (H) 12/03/2020   CL 102 12/03/2020   CREATININE 0.74 12/03/2020   BUN 12 12/03/2020   CO2 28 12/03/2020   TSH 7.19 (H) 12/03/2020   HGBA1C 5.3 12/03/2020    DG Bone Density  Result Date: 11/07/2018 EXAM: DUAL X-RAY ABSORPTIOMETRY (DXA) FOR BONE MINERAL DENSITY IMPRESSION: [place] A Michelle Jacobs Your patient Michelle Jacobs on 11/07/2018 using the [place] (analysis version: 16.SP2) manufactured by [employer]. The following summarizes the results of our evaluation. PATIENT: Name: Michelle, Jacobs Patient ID: [id number] Birth Date: 1946/08/24 Height: 60.5 in. Gender: Female Measured: 11/07/2018 Weight: 156.2 lbs. Indications: Advanced Age, Caucasian, Estrogen Deficiency, Height Loss, History of Osteoporosis, Hypothyroidism, Low Calcium Intake, Post Menopausal Fractures: Treatments: Fosamax(Alendronate), Levothyroxine, Multivitamin, Vitamin D ASSESSMENT: The BMD measured at AP Spine L2-L4 is 0.888 g/cm2 with a T-score of -2.6. This patient is considered  osteoporotic according to [facility]) criteria. L- 1 was excluded due to degenerative changes. The scan quality is good. Site Region Measured Date Measured Age WHO YA BMD Classification T-score AP Spine L2-L4 11/07/2018 71.4 Osteoporosis -2.6 0.888 g/cm2 AP Spine L2-L4 10/01/2016 69.3 Osteoporosis -2.8 0.868 g/cm2 DualFemur Total Mean 11/07/2018 71.4 Osteopenia -1.5 0.815 g/cm2 DualFemur Total Mean 10/01/2016 69.3 Osteopenia -1.7 0.798 g/cm2 Right Forearm Radius 33% 11/07/2018 71.4 Osteopenia -1.1 0.780 g/cm2 World Health Organization [facility]) criteria for post-menopausal, Caucasian Women: Normal       T-score at or above -1 SD Osteopenia   T-score between -1 and -2.5 SD Osteoporosis T-score at or below -2.5 SD RECOMMENDATION:1. All patients should optimize calcium and vitamin D intake. 2. Consider FDA-approved medical therapies in postmenopausal women and men aged 41 years and older, based  on the following: a. A hip or vertebral(clinical or morphometric) fracture. b. T-Score < -2.5 at the femoral neck or spine after appropriate evaluation to exclude secondary causes c. Low bone mass (T-score between -1.0 and -2.5 at the femoral neck or spine) and a 10 year probability of a hip fracture >3% or a 10 year probability of major osteoporosis-related fracture > 20% based on the [place]-adapted WHO algorithm d. Clinical judgement and/or patient preferences may indicate treatment for people with 10-year fracture probabilities above or below these levels FOLLOW-UP: Patients with diagnosis of osteoporosis or at high risk for fracture should have regular bone mineral density tests. For patients eligible for Medicare, routine testing is allowed once every 2 years. The testing frequency can be increased to one year for patients who have rapidly progressing disease, those who are receiving or discontinuing medical therapy to restore bone mass, or have additional risk factors. I have reviewed this report, anf agree  with the above findings. [facility] Radiology Electronically Signed   By: Lowella Grip III M.D.   On: 11/07/2018 09:38   MM 3D SCREEN BREAST BILATERAL  Result Date: 11/08/2018 CLINICAL DATA:  Screening. EXAM: DIGITAL SCREENING BILATERAL MAMMOGRAM WITH TOMO AND CAD COMPARISON:  Previous exam(s). ACR Breast Density Category d: The breast tissue is extremely dense, which lowers the sensitivity of mammography FINDINGS: There are no findings suspicious for malignancy. Images were processed with CAD. IMPRESSION: No mammographic evidence of malignancy. A result letter of this screening mammogram will be mailed directly to the patient. RECOMMENDATION: Screening mammogram in one year. (Code:SM-B-01Y) BI-RADS CATEGORY  1: Negative. Electronically Signed   By: Evangeline Dakin M.D.   On: 11/08/2018 08:55     Assessment & Plan:  Plan    No orders of the defined types were placed in this encounter.   Problem List Items Addressed This Visit    Osteoporosis - Primary (Chronic)    Encouraged to get adequate exercise, calcium and vitamin d intake. Tolerating Prolia      Relevant Orders   Vitamin D 1,25 dihydroxy (Completed)   DG Bone Density   HTN (hypertension)    Well controlled, no changes to meds. Encouraged heart healthy diet such as the DASH diet and exercise as tolerated.       Relevant Orders   CBC with Differential/Platelet (Completed)   Comprehensive metabolic panel (Completed)   TSH (Completed)   Hyperlipidemia    Encouraged heart healthy diet, increase exercise, avoid trans fats, consider a krill oil cap daily      Relevant Orders   Lipid panel (Completed)   Breast cancer screening   Relevant Orders   MM 3D SCREEN BREAST BILATERAL (Completed)   Hypothyroidism    On Levothyroxine, continue to monitor      Relevant Orders   TSH (Completed)   Hyperglycemia    hgba1c acceptable, minimize simple carbs. Increase exercise as tolerated.       Relevant Orders   Hemoglobin A1c  (Completed)   Comprehensive metabolic panel (Completed)   Vitamin D deficiency    Supplement and monitor      Relevant Orders   Vitamin D 1,25 dihydroxy (Completed)     Mammo: Last completed on 11/07/2018, results were negative, repeat every year. Dexa: Last completed on 11/07/2018, Osteoporosis was observed, repeat every 6 months. Colonoscopy: Last completed on 09/26/2014, results were , repeat every 10 years. Pap Smear: Last completed on 12/02/2011, results were , repeat every 3 years.   Follow-up: Return in about  6 months (around 06/04/2021) for annual exam.   I,David Hanna,acting as a scribe for Penni Homans, MD.,have documented all relevant documentation on the behalf of Penni Homans, MD,as directed by  Penni Homans, MD while in the presence of Penni Homans, MD.  I, Mosie Lukes, MD personally performed the services described in this documentation. All medical record entries made by the scribe were at my direction and in my presence. I have reviewed the chart and agree that the record reflects my personal performance and is accurate and complete

## 2020-12-03 NOTE — Assessment & Plan Note (Signed)
Supplement and monitor 

## 2020-12-03 NOTE — Patient Instructions (Signed)
Osteoporosis  Osteoporosis happens when the bones become thin and less dense than normal. Osteoporosis makes bones more brittle and fragile and more likely to break (fracture). Over time, osteoporosis can cause your bones to become so weak that they fracture after a minor fall. Bones in the hip, wrist, and spine are most likely to fracture due to osteoporosis. What are the causes? The exact cause of this condition is not known. What increases the risk? You are more likely to develop this condition if you:  Have family members with this condition.  Have poor nutrition.  Use the following: ? Steroid medicines, such as prednisone. ? Anti-seizure medicines. ? Nicotine or tobacco, such as cigarettes, e-cigarettes, and chewing tobacco.  Are female.  Are age 50 or older.  Are not physically active (are sedentary).  Are of European or Asian descent.  Have a small body frame. What are the signs or symptoms? A fracture might be the first sign of osteoporosis, especially if the fracture results from a fall or injury that usually would not cause a bone to break. Other signs and symptoms include:  Pain in the neck or low back.  Stooped posture.  Loss of height. How is this diagnosed? This condition may be diagnosed based on:  Your medical history.  A physical exam.  A bone mineral density test, also called a DXA or DEXA test (dual-energy X-ray absorptiometry test). This test uses X-rays to measure the amount of minerals in your bones. How is this treated? This condition may be treated by:  Making lifestyle changes, such as: ? Including foods with more calcium and vitamin D in your diet. ? Doing weight-bearing and muscle-strengthening exercises. ? Stopping tobacco use. ? Limiting alcohol intake.  Taking medicine to slow the process of bone loss or to increase bone density.  Taking daily supplements of calcium and vitamin D.  Taking hormone replacement medicines, such as  estrogen for women and testosterone for men.  Monitoring your levels of calcium and vitamin D. The goal of treatment is to strengthen your bones and lower your risk for a fracture. Follow these instructions at home: Eating and drinking Include calcium and vitamin D in your diet. Calcium is important for bone health, and vitamin D helps your body absorb calcium. Good sources of calcium and vitamin D include:  Certain fatty fish, such as salmon and tuna.  Products that have calcium and vitamin D added to them (are fortified), such as fortified cereals.  Egg yolks.  Cheese.  Liver.   Activity Do exercises as told by your health care provider. Ask your health care provider what exercises and activities are safe for you. You should do:  Exercises that make you work against gravity (weight-bearing exercises), such as tai chi, yoga, or walking.  Exercises to strengthen muscles, such as lifting weights. Lifestyle  Do not drink alcohol if: ? Your health care provider tells you not to drink. ? You are pregnant, may be pregnant, or are planning to become pregnant.  If you drink alcohol: ? Limit how much you use to:  0-1 drink a day for women.  0-2 drinks a day for men.  Know how much alcohol is in your drink. In the [place] one drink equals one 12 oz bottle of beer (355 mL), one 5 oz glass of wine (148 mL), or one 1 oz glass of hard liquor (44 mL).  Do not use any products that contain nicotine or tobacco, such as cigarettes, e-cigarettes, and chewing tobacco.   If you need help quitting, ask your health care provider. Preventing falls  Use devices to help you move around (mobility aids) as needed, such as canes, walkers, scooters, or crutches.  Keep rooms well-lit and clutter-free.  Remove tripping hazards from walkways, including cords and throw rugs.  Install grab bars in bathrooms and safety rails on stairs.  Use rubber mats in the bathroom and other areas that are often wet or  slippery.  Wear closed-toe shoes that fit well and support your feet. Wear shoes that have rubber soles or low heels.  Review your medicines with your health care provider. Some medicines can cause dizziness or changes in blood pressure, which can increase your risk of falling. General instructions  Take over-the-counter and prescription medicines only as told by your health care provider.  Keep all follow-up visits. This is important. Contact a health care provider if:  You have never been screened for osteoporosis and you are: ? A woman who is age 65 or older. ? A man who is age 70 or older. Get help right away if:  You fall or injure yourself. Summary  Osteoporosis is thinning and loss of density in your bones. This makes bones more brittle and fragile and more likely to break (fracture),even with minor falls.  The goal of treatment is to strengthen your bones and lower your risk for a fracture.  Include calcium and vitamin D in your diet. Calcium is important for bone health, and vitamin D helps your body absorb calcium.  Talk with your health care provider about screening for osteoporosis if you are a woman who is age 65 or older, or a man who is age 70 or older. This information is not intended to replace advice given to you by your health care provider. Make sure you discuss any questions you have with your health care provider. Document Revised: 02/01/2020 Document Reviewed: 02/01/2020 Elsevier Patient Education  2021 Elsevier Inc.  

## 2020-12-03 NOTE — Assessment & Plan Note (Signed)
Well controlled, no changes to meds. Encouraged heart healthy diet such as the DASH diet and exercise as tolerated.  °

## 2020-12-03 NOTE — Assessment & Plan Note (Signed)
hgba1c acceptable, minimize simple carbs. Increase exercise as tolerated.  

## 2020-12-04 ENCOUNTER — Other Ambulatory Visit: Payer: Self-pay

## 2020-12-04 DIAGNOSIS — E875 Hyperkalemia: Secondary | ICD-10-CM

## 2020-12-04 DIAGNOSIS — E039 Hypothyroidism, unspecified: Secondary | ICD-10-CM

## 2020-12-04 MED ORDER — LEVOTHYROXINE SODIUM 150 MCG PO TABS: 150.0000 ug | ORAL_TABLET | Freq: Every day | ORAL | 3 refills | Status: DC

## 2020-12-04 NOTE — Addendum Note (Signed)
Addended by: Randolm Idol A on: 12/04/2020 10:51 AM   Modules accepted: Orders

## 2020-12-05 ENCOUNTER — Telehealth: Payer: Self-pay | Admitting: Family Medicine

## 2020-12-05 ENCOUNTER — Other Ambulatory Visit: Payer: Self-pay

## 2020-12-05 ENCOUNTER — Other Ambulatory Visit: Payer: Self-pay | Admitting: Family Medicine

## 2020-12-05 DIAGNOSIS — I10 Essential (primary) hypertension: Secondary | ICD-10-CM

## 2020-12-05 MED ORDER — LISINOPRIL 10 MG PO TABS: 1.0000 | ORAL_TABLET | Freq: Every day | ORAL | 1 refills | Status: DC

## 2020-12-05 NOTE — Telephone Encounter (Signed)
Patient would like a  90 days supply Medication: lisinopril (ZESTRIL) 10 MG tablet [[id number]]   atorvastatin (LIPITOR) 10 MG tablet [[id number]]       Has the patient contacted their pharmacy?  (If no, request that the patient contact the pharmacy for the refill.) (If yes, when and what did the pharmacy advise?)     Preferred Pharmacy (with phone number or street name): [place], [place]  [address] [place] [address]  Phone:  [phone] Fax:  [phone]      Agent: Please be advised that RX refills may take up to 3 business days. We ask that you follow-up with your pharmacy.

## 2020-12-05 NOTE — Telephone Encounter (Signed)
Medication sent in. 

## 2020-12-05 NOTE — Telephone Encounter (Signed)
Pharmacy comment: Please verify directions for Synthroid 133mcg tablets. Is it 1 tablet every day or 1 tablet and 1/2 extra every day?  Please verify directions for Synthroid 167mcg tablets. Is it 1 tablet every day or 1 tablet and 1/2 extra every day?

## 2020-12-06 ENCOUNTER — Telehealth: Payer: Self-pay | Admitting: Family Medicine

## 2020-12-06 MED ORDER — ATORVASTATIN CALCIUM 10 MG PO TABS: 1.0000 | ORAL_TABLET | Freq: Every day | ORAL | 1 refills | Status: DC

## 2020-12-06 NOTE — Telephone Encounter (Signed)
Called patient to confirmed to see what she was needing and she stated that she needed her cholesterol medication sent in the atorvastatin.    Medication sent in.

## 2020-12-06 NOTE — Telephone Encounter (Signed)
Patient would like a 3 month supply   Medication:amoxicillin-clavulanate (AUGMENTIN) 875-125 MG per tablet [[id number]] ENDED     Has the patient contacted their pharmacy? no (If no, request that the patient contact the pharmacy for the refill.) (If yes, when and what did the pharmacy advise?)    Preferred Pharmacy (with phone number or street name):  [place], [place] Phone:  [phone]  Fax:  [phone]        Agent: Please be advised that RX refills may take up to 3 business days. We ask that you follow-up with your pharmacy.

## 2020-12-07 LAB — VITAMIN D 1,25 DIHYDROXY
Vitamin D 1, 25 (OH)2 Total: 53 pg/mL (ref 18–72)
Vitamin D2 1, 25 (OH)2: <8 pg/mL
Vitamin D3 1, 25 (OH)2: 53 pg/mL

## 2020-12-08 NOTE — Assessment & Plan Note (Signed)
Encouraged to get adequate exercise, calcium and vitamin d intake. Tolerating Prolia 

## 2020-12-09 ENCOUNTER — Encounter: Payer: Self-pay | Admitting: *Deleted

## 2020-12-16 ENCOUNTER — Ambulatory Visit: Payer: Medicare HMO

## 2020-12-18 ENCOUNTER — Other Ambulatory Visit (INDEPENDENT_AMBULATORY_CARE_PROVIDER_SITE_OTHER): Payer: Medicare HMO

## 2020-12-18 ENCOUNTER — Other Ambulatory Visit: Payer: Self-pay

## 2020-12-18 DIAGNOSIS — E875 Hyperkalemia: Secondary | ICD-10-CM

## 2020-12-18 LAB — COMPREHENSIVE METABOLIC PANEL
ALT: 23 U/L (ref 0–35)
AST: 20 U/L (ref 0–37)
Albumin: 4.3 g/dL (ref 3.5–5.2)
Alkaline Phosphatase: 41 U/L (ref 39–117)
BUN: 16 mg/dL (ref 6–23)
CO2: 27 mEq/L (ref 19–32)
Calcium: 9.7 mg/dL (ref 8.4–10.5)
Chloride: 103 mEq/L (ref 96–112)
Creatinine, Ser: 0.81 mg/dL (ref 0.40–1.20)
GFR: 71.93 mL/min (ref >60.00)
Glucose, Bld: 87 mg/dL (ref 70–99)
Potassium: 5 mEq/L (ref 3.5–5.1)
Sodium: 138 mEq/L (ref 135–145)
Total Bilirubin: 0.7 mg/dL (ref 0.2–1.2)
Total Protein: 6.8 g/dL (ref 6.0–8.3)

## 2020-12-20 ENCOUNTER — Ambulatory Visit: Payer: Medicare HMO

## 2020-12-20 ENCOUNTER — Ambulatory Visit: Payer: Medicare HMO | Attending: Internal Medicine

## 2020-12-20 DIAGNOSIS — Z23 Encounter for immunization: Secondary | ICD-10-CM

## 2020-12-20 NOTE — Progress Notes (Signed)
   Covid-19 Vaccination Clinic  Name:  PAIZLEE KINDER    MRN: [id number] DOB: 05/31/1946  12/20/2020  Ms. Leavey was observed post Covid-19 immunization for 15 minutes without incident. She was provided with Vaccine Information Sheet and instruction to access the V-Safe system.   Ms. Debell was instructed to call 911 with any severe reactions post vaccine: Marland Kitchen Difficulty breathing  . Swelling of face and throat  . A fast heartbeat  . A bad rash all over body  . Dizziness and weakness   Immunizations Administered    Name Date Dose VIS Date Route   PFIZER Comrnaty(Gray TOP) Covid-19 Vaccine 12/20/2020  8:53 AM 0.3 mL 08/08/2020 Intramuscular   Manufacturer: [place]   Lot: [id number]   NDC: [phone]

## 2020-12-23 ENCOUNTER — Other Ambulatory Visit (HOSPITAL_BASED_OUTPATIENT_CLINIC_OR_DEPARTMENT_OTHER): Payer: Self-pay

## 2020-12-23 MED ORDER — PFIZER-BIONT COVID-19 VAC-TRIS 30 MCG/0.3ML IM SUSP
INTRAMUSCULAR | 0 refills | Status: DC
  Filled 2020-12-23: qty 0.3, 1d supply, fill #0

## 2021-02-11 NOTE — Progress Notes (Signed)
Subjective:   Michelle Jacobs is a 75 y.o. female who presents for Medicare Annual (Subsequent) preventive examination.  I connected with Shalia today by telephone and verified that I am speaking with the correct person using two identifiers. Location patient: home Location provider: work Persons participating in the virtual visit: patient, Marine scientist.    I discussed the limitations, risks, security and privacy concerns of performing an evaluation and management service by telephone and the availability of in person appointments. I also discussed with the patient that there may be a patient responsible charge related to this service. The patient expressed understanding and verbally consented to this telephonic visit.    Interactive audio and video telecommunications were attempted between this provider and patient, however failed, due to patient having technical difficulties OR patient did not have access to video capability.  We continued and completed visit with audio only.  Some vital signs may be absent or patient reported.   Time Spent with patient on telephone encounter: 20 minutes   Review of Systems     Cardiac Risk Factors include: advanced age (>55men, >85 women);dyslipidemia;hypertension     Objective:    Today's Vitals   02/12/21 0856  Weight: 162 lb (73.5 kg)  Height: 5\' 3"  (1.6 m)   Body mass index is 28.7 kg/m.  Advanced Directives 02/12/2021 11/13/2019 11/11/2018 09/25/2016 09/23/2015  Does Patient Have a Medical Advance Directive? Yes Yes Yes No;Yes Yes  Type of Paramedic of [place];Living will [place];Living will [place];Living will [place];Living will [place];Living will  Does patient want to make changes to medical advance directive? - No - Patient declined Yes (MAU/Ambulatory/Procedural Areas - Information given) Yes (MAU/Ambulatory/Procedural Areas -  Information given) No - Patient declined  Copy of [place] in Chart? Yes - validated most recent copy scanned in chart (See row information) No - copy requested Yes - validated most recent copy scanned in chart (See row information) Yes No - copy requested  Would patient like information on creating a medical advance directive? - - - No - Patient declined -    Current Medications (verified) Outpatient Encounter Medications as of 02/12/2021  Medication Sig   atorvastatin (LIPITOR) 10 MG tablet Take 1 tablet (10 mg total) by mouth daily.   cetirizine (ZYRTEC) 10 MG tablet Take 10 mg by mouth daily.   Cholecalciferol (VITAMIN D3) 125 MCG (5000 UT) CAPS Take by mouth. 1 cap 4 times a week.   levothyroxine (SYNTHROID) 150 MCG tablet 1 tab po daily except Saturday take 1.5 tablets once daily   lisinopril (ZESTRIL) 10 MG tablet Take 1 tablet (10 mg total) by mouth daily.   [DISCONTINUED] COVID-19 mRNA Vac-TriS, Pfizer, (PFIZER-BIONT COVID-19 VAC-TRIS) SUSP injection Inject into the muscle.   No facility-administered encounter medications on file as of 02/12/2021.    Allergies (verified) Codeine, Iodine, Mercurochrome [merbromin], and Sulfa antibiotics   History: Past Medical History:  Diagnosis Date   Allergy    Annual physical exam 10/06/2015   GERD (gastroesophageal reflux disease)    mild   History of chicken pox    Hyperglycemia 05/20/2013   Hyperlipidemia    Hypertension    Medicare annual wellness visit, subsequent 12/02/2011   Personal history of colonic polyps 2009   Benign colon polyps   Preventative health care 12/02/2011   Verrucous skin lesion 11/04/2016   History reviewed. No pertinent surgical history. Family History  Problem  Relation Age of Onset   Cancer Mother        lung   Hypertension Mother    Heart disease Father        CHF   Diabetes Maternal Uncle        type II   Heart disease Maternal Uncle        s/p CABG   Cancer Maternal Grandmother 42        breast   Heart disease Maternal Grandmother        MI, CHF   Obesity Maternal Grandmother    Breast cancer Maternal Grandmother    Cancer Sister        uterine   Hypertension Sister    Hyperlipidemia Brother    Hypertension Brother    Cancer Brother        HPV associated oral cancer   Hearing loss Brother    Kidney disease Maternal Grandfather        kidney failure   Cancer Paternal Grandmother    COPD Paternal Grandfather    Diabetes Maternal Aunt        older, type 2   Social History   Socioeconomic History   Marital status: Married    Spouse name: Not on file   Number of children: 2   Years of education: Not on file   Highest education level: Not on file  Occupational History    Employer: [employer]  Tobacco Use   Smoking status: Never   Smokeless tobacco: Never  Substance and Sexual Activity   Alcohol use: Yes    Alcohol/week: 10.0 standard drinks    Types: 10 Glasses of wine per week   Drug use: No   Sexual activity: Not on file    Comment: lives with husband, no dietary restrictions  Other Topics Concern   Not on file  Social History Narrative   Caffeine use:  2-3 cups coffee daily   Regular exercise:  Walks daily         Social Determinants of Health   Financial Resource Strain: Low Risk    Difficulty of Paying Living Expenses: Not hard at all  Food Insecurity: No Food Insecurity   Worried About Charity fundraiser in the Last Year: Never true   Ran Out of Food in the Last Year: Never true  Transportation Needs: No Transportation Needs   Lack of Transportation (Medical): No   Lack of Transportation (Non-Medical): No  Physical Activity: Sufficiently Active   Days of Exercise per Week: 5 days   Minutes of Exercise per Session: 60 min  Stress: No Stress Concern Present   Feeling of Stress : Not at all  Social Connections: Moderately Isolated   Frequency of Communication with Friends and Family: More than three times a week    Frequency of Social Gatherings with Friends and Family: More than three times a week   Attends Religious Services: Never   Marine scientist or Organizations: No   Attends Music therapist: Never   Marital Status: Married    Tobacco Counseling Counseling given: Not Answered   Clinical Intake:  Pre-visit preparation completed: Yes  Pain : No/denies pain     Nutritional Status: BMI 25 -29 Overweight Nutritional Risks: None Diabetes: No  How often do you need to have someone help you when you read instructions, pamphlets, or other written materials from your doctor or pharmacy?: 1 - Never  Diabetic?No  Interpreter Needed?: No  Information entered  by :: Caroleen Hamman LPN   Activities of Daily Living In your present state of health, do you have any difficulty performing the following activities: 02/12/2021 12/03/2020  Hearing? N N  Vision? N N  Difficulty concentrating or making decisions? N N  Walking or climbing stairs? N N  Dressing or bathing? N N  Doing errands, shopping? N N  Preparing Food and eating ? N -  Using the Toilet? N -  In the past six months, have you accidently leaked urine? N -  Do you have problems with loss of bowel control? N -  Managing your Medications? N -  Managing your Finances? N -  Housekeeping or managing your Housekeeping? N -  Some recent data might be hidden    Patient Care Team: Mosie Lukes, MD as PCP - General (Family Medicine)  Indicate any recent Medical Services you may have received from other than [facility] providers in the past year (date may be approximate).     Assessment:   This is a routine wellness examination for [facility]  Hearing/Vision screen Hearing Screening - Comments:: No issues Vision Screening - Comments:: Reading glasses Last eye exam-2020-[facility]  Dietary issues and exercise activities discussed: Current Exercise Habits: Home exercise routine, Type of exercise: walking;strength  training/weights, Time (Minutes): 60, Frequency (Times/Week): 6, Weekly Exercise (Minutes/Week): 360, Intensity: Mild, Exercise limited by: None identified   Goals Addressed             This Visit's Progress    Continue to eat healthy and exercise   On track      Depression Screen PHQ 2/9 Scores 02/12/2021 12/03/2020 11/13/2019 05/12/2019 11/11/2018 09/25/2016 11/14/2015  PHQ - 2 Score 0 0 0 0 0 0 0  PHQ- 9 Score - 0 - 0 - - -  Exception Documentation - - - - - - Patient refusal    Fall Risk Fall Risk  02/12/2021 12/03/2020 11/13/2019 05/12/2019 11/11/2018  Falls in the past year? 0 0 0 0 0  Number falls in past yr: 0 0 0 0 -  Comment - - - - -  Injury with Fall? 0 0 0 0 -  Risk for fall due to : - - - - -  Follow up Falls prevention discussed - Education provided;Falls prevention discussed - -    FALL RISK PREVENTION PERTAINING TO THE HOME:  Any stairs in or around the home? Yes  If so, are there any without handrails? No  Home free of loose throw rugs in walkways, pet beds, electrical cords, etc? Yes  Adequate lighting in your home to reduce risk of falls? Yes   ASSISTIVE DEVICES UTILIZED TO PREVENT FALLS:  Life alert? No  Use of a cane, walker or w/c? No  Grab bars in the bathroom? Yes  Shower chair or bench in shower? No  Elevated toilet seat or a handicapped toilet? No   TIMED UP AND GO:  Was the test performed? No . Phone visit   Cognitive Function:Normal cognitive status assessed by this Nurse Health Advisor. No abnormalities found.   MMSE - Mini Mental State Exam 09/25/2016  Orientation to time 5  Orientation to Place 5  Registration 3  Attention/ Calculation 5  Recall 3  Language- name 2 objects 2  Language- repeat 1  Language- follow 3 step command 3  Language- read & follow direction 1  Write a sentence 1  Copy design 1  Total score 30  Immunizations Immunization History  Administered Date(s) Administered   Fluad Quad(high Dose 65+) 05/24/2019,  05/20/2020   Influenza Split 05/30/2012   Influenza, High Dose Seasonal PF 07/20/2014, 09/25/2016, 05/25/2017, 06/03/2018   Influenza,inj,Quad PF,6+ Mos 05/15/2013, 09/23/2015   PFIZER Comirnaty(Gray Top)Covid-19 Tri-Sucrose Vaccine 12/20/2020   PFIZER(Purple Top)SARS-COV-2 Vaccination 10/09/2019, 11/03/2019, 05/28/2020   Pneumococcal Conjugate-13 07/30/2014   Pneumococcal Polysaccharide-23 09/23/2015   Tdap 09/03/2011   Zoster Recombinat (Shingrix) 05/24/2019, 08/01/2019   Zoster, Live 03/28/2013    TDAP status: Up to date  Flu Vaccine status: Up to date  Pneumococcal vaccine status: Up to date  Covid-19 vaccine status: Completed vaccines  Qualifies for Shingles Vaccine? No   Zostavax completed Yes   Shingrix Completed?: Yes  Screening Tests Health Maintenance  Topic Date Due   INFLUENZA VACCINE  03/31/2021   TETANUS/TDAP  09/02/2021   MAMMOGRAM  12/04/2022   COLONOSCOPY (Pts 45-77yrs Insurance coverage will need to be confirmed)  09/26/2024   DEXA SCAN  Completed   COVID-19 Vaccine  Completed   Hepatitis C Screening  Completed   PNA vac Low Risk Adult  Completed   Zoster Vaccines- Shingrix  Completed   HPV VACCINES  Aged Out    Health Maintenance  There are no preventive care reminders to display for this patient.  Colorectal cancer screening: Type of screening: Colonoscopy. Completed 09/26/2014. Repeat every 5 years Patient states she was told to repeat at age 13.  Mammogram status: Completed Bilateral 12/03/2020. Repeat every year  Bone Density status: Scheduled for 05/06/2021  Lung Cancer Screening: (Low Dose CT Chest recommended if Age 10-80 years, 30 pack-year currently smoking OR have quit w/in 15years.) does not qualify.    Additional Screening:  Hepatitis C Screening: Completed 09/23/2015  Vision Screening: Recommended annual ophthalmology exams for early detection of glaucoma and other disorders of the eye. Is the patient up to date with their annual eye  exam?  No  Who is the provider or what is the name of the office in which the patient attends annual eye exams? [facility] Patient states she will see the eye doctor again next year.  Dental Screening: Recommended annual dental exams for proper oral hygiene  Community Resource Referral / Chronic Care Management: CRR required this visit?  No   CCM required this visit?  No      Plan:     I have personally reviewed and noted the following in the patient's chart:   Medical and social history Use of alcohol, tobacco or illicit drugs  Current medications and supplements including opioid prescriptions.  Functional ability and status Nutritional status Physical activity Advanced directives List of other physicians Hospitalizations, surgeries, and ER visits in previous 12 months Vitals Screenings to include cognitive, depression, and falls Referrals and appointments  In addition, I have reviewed and discussed with patient certain preventive protocols, quality metrics, and best practice recommendations. A written personalized care plan for preventive services as well as general preventive health recommendations were provided to patient.   Due to this being a telephonic visit, the after visit summary with patients personalized plan was offered to patient via mail or my-chart. Patient declined at this time.   Marta Antu, LPN   [id number]  Nurse Health Advisor  Nurse Notes: None

## 2021-02-12 ENCOUNTER — Ambulatory Visit (INDEPENDENT_AMBULATORY_CARE_PROVIDER_SITE_OTHER): Payer: Medicare HMO

## 2021-02-12 VITALS — Ht 63.0 in | Wt 162.0 lb

## 2021-02-12 DIAGNOSIS — Z0000 Encounter for general adult medical examination without abnormal findings: Secondary | ICD-10-CM

## 2021-02-12 NOTE — Patient Instructions (Signed)
Michelle Jacobs , Thank you for taking time to complete your Medicare Wellness Visit. I appreciate your ongoing commitment to your health goals. Please review the following plan we discussed and let me know if I can assist you in the future.   Screening recommendations/referrals: Colonoscopy: Completed 09/25/2014-Per our conversation, you were told to repeat at age 75. Mammogram: Completed 12/03/2020-Due 12/03/2021 Bone Density: Scheduled for 05/06/2021 Recommended yearly ophthalmology/optometry visit for glaucoma screening and checkup Recommended yearly dental visit for hygiene and checkup  Vaccinations: Influenza vaccine: Up to date Pneumococcal vaccine: Up to date Tdap vaccine: Up to date-Due 09/02/2021 Shingles vaccine: Completed vaccines   Covid-19:Up to date  Advanced directives: Copy in chart  Conditions/risks identified: See problem list  Next appointment: Follow up in one year for your annual wellness visit    Preventive Care 30 Years and Older, Female Preventive care refers to lifestyle choices and visits with your health care provider that can promote health and wellness. What does preventive care include? A yearly physical exam. This is also called an annual well check. Dental exams once or twice a year. Routine eye exams. Ask your health care provider how often you should have your eyes checked. Personal lifestyle choices, including: Daily care of your teeth and gums. Regular physical activity. Eating a healthy diet. Avoiding tobacco and drug use. Limiting alcohol use. Practicing safe sex. Taking low-dose aspirin every day. Taking vitamin and mineral supplements as recommended by your health care provider. What happens during an annual well check? The services and screenings done by your health care provider during your annual well check will depend on your age, overall health, lifestyle risk factors, and family history of disease. Counseling  Your health care provider may  ask you questions about your: Alcohol use. Tobacco use. Drug use. Emotional well-being. Home and relationship well-being. Sexual activity. Eating habits. History of falls. Memory and ability to understand (cognition). Work and work Statistician. Reproductive health. Screening  You may have the following tests or measurements: Height, weight, and BMI. Blood pressure. Lipid and cholesterol levels. These may be checked every 5 years, or more frequently if you are over 64 years old. Skin check. Lung cancer screening. You may have this screening every year starting at age 53 if you have a 30-pack-year history of smoking and currently smoke or have quit within the past 15 years. Fecal occult blood test (FOBT) of the stool. You may have this test every year starting at age 67. Flexible sigmoidoscopy or colonoscopy. You may have a sigmoidoscopy every 5 years or a colonoscopy every 10 years starting at age 64. Hepatitis C blood test. Hepatitis B blood test. Sexually transmitted disease (STD) testing. Diabetes screening. This is done by checking your blood sugar (glucose) after you have not eaten for a while (fasting). You may have this done every 1-3 years. Bone density scan. This is done to screen for osteoporosis. You may have this done starting at age 33. Mammogram. This may be done every 1-2 years. Talk to your health care provider about how often you should have regular mammograms. Talk with your health care provider about your test results, treatment options, and if necessary, the need for more tests. Vaccines  Your health care provider may recommend certain vaccines, such as: Influenza vaccine. This is recommended every year. Tetanus, diphtheria, and acellular pertussis (Tdap, Td) vaccine. You may need a Td booster every 10 years. Zoster vaccine. You may need this after age 52. Pneumococcal 13-valent conjugate (PCV13) vaccine. One dose  is recommended after age 70. Pneumococcal  polysaccharide (PPSV23) vaccine. One dose is recommended after age 75. Talk to your health care provider about which screenings and vaccines you need and how often you need them. This information is not intended to replace advice given to you by your health care provider. Make sure you discuss any questions you have with your health care provider. Document Released: 09/13/2015 Document Revised: 05/06/2016 Document Reviewed: 06/18/2015 Elsevier Interactive Patient Education  2017 [place] Prevention in the Home Falls can cause injuries. They can happen to people of all ages. There are many things you can do to make your home safe and to help prevent falls. What can I do on the outside of my home? Regularly fix the edges of walkways and driveways and fix any cracks. Remove anything that might make you trip as you walk through a door, such as a raised step or threshold. Trim any bushes or trees on the path to your home. Use bright outdoor lighting. Clear any walking paths of anything that might make someone trip, such as rocks or tools. Regularly check to see if handrails are loose or broken. Make sure that both sides of any steps have handrails. Any raised decks and porches should have guardrails on the edges. Have any leaves, snow, or ice cleared regularly. Use sand or salt on walking paths during winter. Clean up any spills in your garage right away. This includes oil or grease spills. What can I do in the bathroom? Use night lights. Install grab bars by the toilet and in the tub and shower. Do not use towel bars as grab bars. Use non-skid mats or decals in the tub or shower. If you need to sit down in the shower, use a plastic, non-slip stool. Keep the floor dry. Clean up any water that spills on the floor as soon as it happens. Remove soap buildup in the tub or shower regularly. Attach bath mats securely with double-sided non-slip rug tape. Do not have throw rugs and other  things on the floor that can make you trip. What can I do in the bedroom? Use night lights. Make sure that you have a light by your bed that is easy to reach. Do not use any sheets or blankets that are too big for your bed. They should not hang down onto the floor. Have a firm chair that has side arms. You can use this for support while you get dressed. Do not have throw rugs and other things on the floor that can make you trip. What can I do in the kitchen? Clean up any spills right away. Avoid walking on wet floors. Keep items that you use a lot in easy-to-reach places. If you need to reach something above you, use a strong step stool that has a grab bar. Keep electrical cords out of the way. Do not use floor polish or wax that makes floors slippery. If you must use wax, use non-skid floor wax. Do not have throw rugs and other things on the floor that can make you trip. What can I do with my stairs? Do not leave any items on the stairs. Make sure that there are handrails on both sides of the stairs and use them. Fix handrails that are broken or loose. Make sure that handrails are as long as the stairways. Check any carpeting to make sure that it is firmly attached to the stairs. Fix any carpet that is loose or worn. Avoid  having throw rugs at the top or bottom of the stairs. If you do have throw rugs, attach them to the floor with carpet tape. Make sure that you have a light switch at the top of the stairs and the bottom of the stairs. If you do not have them, ask someone to add them for you. What else can I do to help prevent falls? Wear shoes that: Do not have high heels. Have rubber bottoms. Are comfortable and fit you well. Are closed at the toe. Do not wear sandals. If you use a stepladder: Make sure that it is fully opened. Do not climb a closed stepladder. Make sure that both sides of the stepladder are locked into place. Ask someone to hold it for you, if possible. Clearly  mark and make sure that you can see: Any grab bars or handrails. First and last steps. Where the edge of each step is. Use tools that help you move around (mobility aids) if they are needed. These include: Canes. Walkers. Scooters. Crutches. Turn on the lights when you go into a dark area. Replace any light bulbs as soon as they burn out. Set up your furniture so you have a clear path. Avoid moving your furniture around. If any of your floors are uneven, fix them. If there are any pets around you, be aware of where they are. Review your medicines with your doctor. Some medicines can make you feel dizzy. This can increase your chance of falling. Ask your doctor what other things that you can do to help prevent falls. This information is not intended to replace advice given to you by your health care provider. Make sure you discuss any questions you have with your health care provider. Document Released: 06/13/2009 Document Revised: 01/23/2016 Document Reviewed: 09/21/2014 Elsevier Interactive Patient Education  2017 [employer].

## 2021-03-06 ENCOUNTER — Other Ambulatory Visit: Payer: Self-pay

## 2021-03-06 ENCOUNTER — Other Ambulatory Visit (INDEPENDENT_AMBULATORY_CARE_PROVIDER_SITE_OTHER): Payer: Medicare HMO

## 2021-03-06 DIAGNOSIS — E039 Hypothyroidism, unspecified: Secondary | ICD-10-CM

## 2021-03-06 LAB — TSH: TSH: 5 u[IU]/mL (ref 0.35–5.50)

## 2021-03-20 ENCOUNTER — Other Ambulatory Visit: Payer: Self-pay | Admitting: Family Medicine

## 2021-03-20 DIAGNOSIS — I10 Essential (primary) hypertension: Secondary | ICD-10-CM

## 2021-04-03 ENCOUNTER — Other Ambulatory Visit: Payer: Self-pay

## 2021-04-03 ENCOUNTER — Ambulatory Visit (INDEPENDENT_AMBULATORY_CARE_PROVIDER_SITE_OTHER): Payer: Medicare HMO

## 2021-04-03 DIAGNOSIS — M810 Age-related osteoporosis without current pathological fracture: Secondary | ICD-10-CM

## 2021-04-03 DIAGNOSIS — Z578 Occupational exposure to other risk factors: Secondary | ICD-10-CM

## 2021-04-03 MED ORDER — DENOSUMAB 60 MG/ML ~~LOC~~ SOSY
60.0000 mg | PREFILLED_SYRINGE | Freq: Once | SUBCUTANEOUS | Status: AC
  Administered 2021-04-03: 60 mg via SUBCUTANEOUS

## 2021-04-03 NOTE — Addendum Note (Signed)
Addended by: Gerilyn Nestle on: 04/03/2021 10:54 AM   Modules accepted: Orders

## 2021-04-03 NOTE — Progress Notes (Signed)
Michelle Jacobs is a 75 y.o. female presents to the office today for prolia injections, per physician's orders. Original order: 01/29/2017 denosumab (med), 60 mg/1 mL (dose),  SQ (route) was administered left upper arm (location) today. Patient tolerated injection. Patient due for follow up labs/provider appt: No. Date due: 05/2021 appt made Yes Patient next injection due: 6 months (10/05/2020), appt made No  [name]

## 2021-04-04 ENCOUNTER — Other Ambulatory Visit: Payer: Self-pay

## 2021-05-02 ENCOUNTER — Other Ambulatory Visit (HOSPITAL_BASED_OUTPATIENT_CLINIC_OR_DEPARTMENT_OTHER): Payer: Self-pay

## 2021-05-06 ENCOUNTER — Ambulatory Visit (HOSPITAL_BASED_OUTPATIENT_CLINIC_OR_DEPARTMENT_OTHER)
Admission: RE | Admit: 2021-05-06 | Discharge: 2021-05-06 | Disposition: A | Discharge status: Home / Self Care | Payer: Medicare HMO | Source: Ambulatory Visit | Attending: Family Medicine | Admitting: Family Medicine

## 2021-05-06 ENCOUNTER — Other Ambulatory Visit: Payer: Self-pay

## 2021-05-06 DIAGNOSIS — Z780 Asymptomatic menopausal state: Secondary | ICD-10-CM | POA: Diagnosis not present

## 2021-05-06 DIAGNOSIS — M8589 Other specified disorders of bone density and structure, multiple sites: Secondary | ICD-10-CM | POA: Diagnosis not present

## 2021-05-06 DIAGNOSIS — M810 Age-related osteoporosis without current pathological fracture: Secondary | ICD-10-CM | POA: Insufficient documentation

## 2021-06-02 ENCOUNTER — Other Ambulatory Visit: Payer: Self-pay | Admitting: Family Medicine

## 2021-06-20 ENCOUNTER — Ambulatory Visit: Payer: Medicare HMO | Attending: Internal Medicine

## 2021-06-20 DIAGNOSIS — Z23 Encounter for immunization: Secondary | ICD-10-CM

## 2021-06-20 NOTE — Progress Notes (Signed)
   Covid-19 Vaccination Clinic  Name:  Michelle Jacobs    MRN: [id number] DOB: 02/06/1946  06/20/2021  [name] was observed post Covid-19 immunization for 15 minutes without incident. She was provided with Vaccine Information Sheet and instruction to access the V-Safe system.   [name] was instructed to call 911 with any severe reactions post vaccine: Difficulty breathing  Swelling of face and throat  A fast heartbeat  A bad rash all over body  Dizziness and weakness   Immunizations Administered     Name Date Dose VIS Date Route   Pfizer Covid-19 Vaccine Bivalent Booster 06/20/2021  9:33 AM 0.3 mL 04/30/2021 Intramuscular   Manufacturer: [place]   Lot: [id number]   [place]: [phone]

## 2021-07-14 ENCOUNTER — Other Ambulatory Visit: Payer: Self-pay

## 2021-07-15 ENCOUNTER — Ambulatory Visit: Payer: Self-pay

## 2021-07-15 ENCOUNTER — Encounter: Payer: Self-pay | Admitting: Family Medicine

## 2021-07-15 ENCOUNTER — Ambulatory Visit (INDEPENDENT_AMBULATORY_CARE_PROVIDER_SITE_OTHER): Payer: Medicare HMO | Admitting: Family Medicine

## 2021-07-15 ENCOUNTER — Ambulatory Visit (HOSPITAL_BASED_OUTPATIENT_CLINIC_OR_DEPARTMENT_OTHER)
Admission: RE | Admit: 2021-07-15 | Discharge: 2021-07-15 | Disposition: A | Discharge status: Home / Self Care | Payer: Medicare HMO | Source: Ambulatory Visit | Attending: Family Medicine | Admitting: Family Medicine

## 2021-07-15 ENCOUNTER — Ambulatory Visit: Payer: Medicare HMO | Admitting: Family Medicine

## 2021-07-15 VITALS — BP 140/72 | Ht 62.0 in | Wt 162.0 lb

## 2021-07-15 VITALS — BP 124/62 | HR 72 | Temp 98.0°F | Resp 16 | Ht 62.0 in | Wt 162.2 lb

## 2021-07-15 DIAGNOSIS — Z0000 Encounter for general adult medical examination without abnormal findings: Secondary | ICD-10-CM

## 2021-07-15 DIAGNOSIS — R739 Hyperglycemia, unspecified: Secondary | ICD-10-CM

## 2021-07-15 DIAGNOSIS — I10 Essential (primary) hypertension: Secondary | ICD-10-CM

## 2021-07-15 DIAGNOSIS — M8580 Other specified disorders of bone density and structure, unspecified site: Secondary | ICD-10-CM | POA: Diagnosis not present

## 2021-07-15 DIAGNOSIS — E782 Mixed hyperlipidemia: Secondary | ICD-10-CM

## 2021-07-15 DIAGNOSIS — Z23 Encounter for immunization: Secondary | ICD-10-CM

## 2021-07-15 DIAGNOSIS — E039 Hypothyroidism, unspecified: Secondary | ICD-10-CM | POA: Diagnosis not present

## 2021-07-15 DIAGNOSIS — E559 Vitamin D deficiency, unspecified: Secondary | ICD-10-CM

## 2021-07-15 DIAGNOSIS — M25511 Pain in right shoulder: Secondary | ICD-10-CM | POA: Diagnosis not present

## 2021-07-15 DIAGNOSIS — M7541 Impingement syndrome of right shoulder: Secondary | ICD-10-CM

## 2021-07-15 MED ORDER — MELOXICAM 7.5 MG PO TABS: 7.5000 mg | ORAL_TABLET | Freq: Two times a day (BID) | ORAL | 1 refills | Status: DC | PRN

## 2021-07-15 NOTE — Progress Notes (Signed)
Michelle Jacobs - 75 y.o. female MRN [id number]  Date of birth: Nov 06, 1945  SUBJECTIVE:  Including CC & ROS.  No chief complaint on file.   Michelle Jacobs is a 75 y.o. female that is presenting with right shoulder pain.  The pain is been ongoing for few months.  It is intermittent in nature and inconsistent.  She notices it with certain movements.  No history of surgery.  She is active and does workout on a regular basis.  Independent review of the right shoulder x-ray from 11/15 shows no acute changes.   Review of Systems See HPI   HISTORY: Past Medical, Surgical, Social, and Family History Reviewed & Updated per EMR.   Pertinent Historical Findings include:  Past Medical History:  Diagnosis Date   Allergy    Annual physical exam 10/06/2015   GERD (gastroesophageal reflux disease)    mild   History of chicken pox    Hyperglycemia 05/20/2013   Hyperlipidemia    Hypertension    Medicare annual wellness visit, subsequent 12/02/2011   Personal history of colonic polyps 2009   Benign colon polyps   Preventative health care 12/02/2011   Verrucous skin lesion 11/04/2016    History reviewed. No pertinent surgical history.  Family History  Problem Relation Age of Onset   Cancer Mother        lung   Hypertension Mother    Heart disease Father        CHF   Diabetes Maternal Uncle        type II   Heart disease Maternal Uncle        s/p CABG   Cancer Maternal Grandmother 42       breast   Heart disease Maternal Grandmother        MI, CHF   Obesity Maternal Grandmother    Breast cancer Maternal Grandmother    Cancer Sister        uterine   Hypertension Sister    Hyperlipidemia Brother    Hypertension Brother    Cancer Brother        HPV associated oral cancer   Hearing loss Brother    Kidney disease Maternal Grandfather        kidney failure   Cancer Paternal Grandmother    COPD Paternal Grandfather    Diabetes Maternal Aunt        older, type 2    Social  History   Socioeconomic History   Marital status: Married    Spouse name: Not on file   Number of children: 2   Years of education: Not on file   Highest education level: Not on file  Occupational History    Employer: [employer]  Tobacco Use   Smoking status: Never   Smokeless tobacco: Never  Substance and Sexual Activity   Alcohol use: Yes    Alcohol/week: 10.0 standard drinks    Types: 10 Glasses of wine per week   Drug use: No   Sexual activity: Not on file    Comment: lives with husband, no dietary restrictions  Other Topics Concern   Not on file  Social History Narrative   Caffeine use:  2-3 cups coffee daily   Regular exercise:  Walks daily         Social Determinants of Health   Financial Resource Strain: Low Risk    Difficulty of Paying Living Expenses: Not hard at all  Food Insecurity: No Food Insecurity   Worried About  Running Out of Food in the Last Year: Never true   Ran Out of Food in the Last Year: Never true  Transportation Needs: No Transportation Needs   Lack of Transportation (Medical): No   Lack of Transportation (Non-Medical): No  Physical Activity: Sufficiently Active   Days of Exercise per Week: 5 days   Minutes of Exercise per Session: 60 min  Stress: No Stress Concern Present   Feeling of Stress : Not at all  Social Connections: Moderately Isolated   Frequency of Communication with Friends and Family: More than three times a week   Frequency of Social Gatherings with Friends and Family: More than three times a week   Attends Religious Services: Never   Marine scientist or Organizations: No   Attends Music therapist: Never   Marital Status: Married  Human resources officer Violence: Not At Risk   Fear of Current or Ex-Partner: No   Emotionally Abused: No   Physically Abused: No   Sexually Abused: No     PHYSICAL EXAM:  VS: BP 140/72 (BP Location: Left Arm, Patient Position: Sitting)   Ht 5\' 2"  (1.575 m)    Wt 162 lb (73.5 kg)   BMI 29.63 kg/m  Physical Exam Gen: NAD, alert, cooperative with exam, well-appearing   Limited ultrasound: Right shoulder:  Normal-appearing biceps tendon in long and short axis.  There is an anechoic change near the origin of the biceps tendon. Normal-appearing subscapularis. Normal-appearing supraspinatus but does not demonstrate dynamic impingement. No change in the posterior glenohumeral joint line  Summary: Symptoms consistent with impingement  Ultrasound and interpretation by Clearance Coots, MD   ASSESSMENT & PLAN:   Subacromial impingement of right shoulder Symptoms seem more consistent with impingement noted on exam.  She does have a bursitis anteriorly that could be contributing.  Rotator cuff exam is reassuring. -Counseled on home exercise therapy and supportive care. -Meloxicam. -Could consider physical therapy or injection.

## 2021-07-15 NOTE — Assessment & Plan Note (Signed)
Patient encouraged to maintain heart healthy diet, regular exercise, adequate sleep. Consider daily probiotics. Take medications as prescribed. Labs ordered and reviewed. Given flu shot, other shots UTD Last colonoscopy 2016 told to repeat in 5 years has not proceeded. Encouraged to do so. No pap since 2013 but no h/o abnormalities. Declines referral to GYN but agrees to pap at next visit. MGM done this year. Repeat every 1-2 years.

## 2021-07-15 NOTE — Assessment & Plan Note (Signed)
On Levothyroxine, continue to monitor 

## 2021-07-15 NOTE — Patient Instructions (Signed)
Nice to meet you Please try the exercises  Please try the mobic for 5 days straight and then as needed  Please use ice as needed  Please send me a message in [place] with any questions or updates.  Please see me back in 4 weeks.   --Dr. Raeford Razor

## 2021-07-15 NOTE — Assessment & Plan Note (Signed)
Encourage heart healthy diet such as MIND or DASH diet, increase exercise, avoid trans fats, simple carbohydrates and processed foods, consider a krill or fish or flaxseed oil cap daily. Tolerating Atorvastatin 

## 2021-07-15 NOTE — Assessment & Plan Note (Signed)
Has had anterior pain with internal rotation off and on for a couple of months referred to sports med and xray ordered today

## 2021-07-15 NOTE — Assessment & Plan Note (Signed)
Well controlled, no changes to meds. Encouraged heart healthy diet such as the DASH diet and exercise as tolerated.  °

## 2021-07-15 NOTE — Patient Instructions (Addendum)
Bone density shows osteopenia, which is thinner than normal but not as bad as osteoporosis. Recommend calcium intake of 1200 to 1500 mg daily, divided into roughly 3 doses. Best source is the diet and a single dairy serving is about 500 mg, a supplement of calcium citrate once or twice daily to balance diet is fine if not getting enough in diet. Also need Vitamin D 2000 IU caps, 1 cap daily if not already taking vitamin D. Also recommend weight baring exercise on hips and upper body to keep bones strong   If you get hurt or injured call us to schedule for Tdap shot  Preventive Care 65 Years and Older, Female Preventive care refers to lifestyle choices and visits with your health care provider that can promote health and wellness. Preventive care visits are also called wellness exams. What can I expect for my preventive care visit? Counseling Your health care provider may ask you questions about your: Medical history, including: Past medical problems. Family medical history. Pregnancy and menstrual history. History of falls. Current health, including: Memory and ability to understand (cognition). Emotional well-being. Home life and relationship well-being. Sexual activity and sexual health. Lifestyle, including: Alcohol, nicotine or tobacco, and drug use. Access to firearms. Diet, exercise, and sleep habits. Work and work Statistician. Sunscreen use. Safety issues such as seatbelt and bike helmet use. Physical exam Your health care provider will check your: Height and weight. These may be used to calculate your BMI (body mass index). BMI is a measurement that tells if you are at a healthy weight. Waist circumference. This measures the distance around your waistline. This measurement also tells if you are at a healthy weight and may help predict your risk of certain diseases, such as type 2 diabetes and high blood pressure. Heart rate and blood pressure. Body temperature. Skin for  abnormal spots. What immunizations do I need? Vaccines are usually given at various ages, according to a schedule. Your health care provider will recommend vaccines for you based on your age, medical history, and lifestyle or other factors, such as travel or where you work. What tests do I need? Screening Your health care provider may recommend screening tests for certain conditions. This may include: Lipid and cholesterol levels. Hepatitis C test. Hepatitis B test. HIV (human immunodeficiency virus) test. STI (sexually transmitted infection) testing, if you are at risk. Lung cancer screening. Colorectal cancer screening. Diabetes screening. This is done by checking your blood sugar (glucose) after you have not eaten for a while (fasting). Mammogram. Talk with your health care provider about how often you should have regular mammograms. BRCA-related cancer screening. This may be done if you have a family history of breast, ovarian, tubal, or peritoneal cancers. Bone density scan. This is done to screen for osteoporosis. Talk with your health care provider about your test results, treatment options, and if necessary, the need for more tests. Follow these instructions at home: Eating and drinking  Eat a diet that includes fresh fruits and vegetables, whole grains, lean protein, and low-fat dairy products. Limit your intake of foods with high amounts of sugar, saturated fats, and salt. Take vitamin and mineral supplements as recommended by your health care provider. Do not drink alcohol if your health care provider tells you not to drink. If you drink alcohol: Limit how much you have to 0-1 drink a day. Know how much alcohol is in your drink. In the [place] one drink equals one 12 oz bottle of beer (355 mL), one  5 oz glass of wine (148 mL), or one 1 oz glass of hard liquor (44 mL). Lifestyle Brush your teeth every morning and night with fluoride toothpaste. Floss one time each day. Exercise  for at least 30 minutes 5 or more days each week. Do not use any products that contain nicotine or tobacco. These products include cigarettes, chewing tobacco, and vaping devices, such as e-cigarettes. If you need help quitting, ask your health care provider. Do not use drugs. If you are sexually active, practice safe sex. Use a condom or other form of protection in order to prevent STIs. Take aspirin only as told by your health care provider. Make sure that you understand how much to take and what form to take. Work with your health care provider to find out whether it is safe and beneficial for you to take aspirin daily. Ask your health care provider if you need to take a cholesterol-lowering medicine (statin). Find healthy ways to manage stress, such as: Meditation, yoga, or listening to music. Journaling. Talking to a trusted person. Spending time with friends and family. Minimize exposure to UV radiation to reduce your risk of skin cancer. Safety Always wear your seat belt while driving or riding in a vehicle. Do not drive: If you have been drinking alcohol. Do not ride with someone who has been drinking. When you are tired or distracted. While texting. If you have been using any mind-altering substances or drugs. Wear a helmet and other protective equipment during sports activities. If you have firearms in your house, make sure you follow all gun safety procedures. What's next? Visit your health care provider once a year for an annual wellness visit. Ask your health care provider how often you should have your eyes and teeth checked. Stay up to date on all vaccines. This information is not intended to replace advice given to you by your health care provider. Make sure you discuss any questions you have with your health care provider. Document Revised: 02/12/2021 Document Reviewed: 02/12/2021 Elsevier Patient Education  [place].

## 2021-07-15 NOTE — Progress Notes (Signed)
Patient ID: MARTA BOUIE, female    DOB: 08/04/1946  Age: 75 y.o. MRN: [id number]    Subjective:   Chief Complaint  Patient presents with   Annual Exam   Subjective  HPI KALAYLA SHADDEN presents for office visit today for comprehensive physical exam today and follow up on management of chronic concerns.   Right shoulder pain/soreness:She states that it results in limited ROM of right arm. Onset was approx 2 months ago and symptoms are waxing and waning. She denies having any recent falls and at the moment she is experiencing any other symptoms. Denies CP/palp/SOB/HA/congestion/fevers/GI or GU c/o. Taking meds as prescribed.  She has about 1-3 glasses of wine a night. Endorses taking Vit D 4000 IU 4x weekly and multivitamins.   Review of Systems  Constitutional:  Negative for chills, fatigue and fever.  HENT:  Negative for congestion, rhinorrhea, sinus pressure, sinus pain, sore throat and trouble swallowing.   Eyes:  Negative for pain.  Respiratory:  Negative for cough and shortness of breath.   Cardiovascular:  Negative for chest pain, palpitations and leg swelling.  Gastrointestinal:  Negative for abdominal pain, blood in stool, diarrhea, nausea and vomiting.  Genitourinary:  Negative for decreased urine volume, flank pain, frequency, vaginal bleeding and vaginal discharge.  Musculoskeletal:  Negative for back pain.  Neurological:  Negative for headaches.   History Past Medical History:  Diagnosis Date   Allergy    Annual physical exam 10/06/2015   GERD (gastroesophageal reflux disease)    mild   History of chicken pox    Hyperglycemia 05/20/2013   Hyperlipidemia    Hypertension    Medicare annual wellness visit, subsequent 12/02/2011   Personal history of colonic polyps 2009   Benign colon polyps   Preventative health care 12/02/2011   Verrucous skin lesion 11/04/2016    She has no past surgical history on file.   Her family history includes Breast cancer in her  maternal grandmother; COPD in her paternal grandfather; Cancer in her brother, mother, paternal grandmother, and sister; Cancer (age of onset: 11) in her maternal grandmother; Diabetes in her maternal aunt and maternal uncle; Hearing loss in her brother; Heart disease in her father, maternal grandmother, and maternal uncle; Hyperlipidemia in her brother; Hypertension in her brother, mother, and sister; Kidney disease in her maternal grandfather; Obesity in her maternal grandmother.She reports that she has never smoked. She has never used smokeless tobacco. She reports current alcohol use of about 10.0 standard drinks per week. She reports that she does not use drugs.  Current Outpatient Medications on File Prior to Visit  Medication Sig Dispense Refill   atorvastatin (LIPITOR) 10 MG tablet TAKE 1 TABLET DAILY 90 tablet 1   cetirizine (ZYRTEC) 10 MG tablet Take 10 mg by mouth daily.     Cholecalciferol (VITAMIN D3) 125 MCG (5000 UT) CAPS Take by mouth. 1 cap 4 times a week.     levothyroxine (SYNTHROID) 150 MCG tablet TAKE 1 TABLET DAILY EXCEPT SATURDAY TAKE 1 AND 1/2      TABLETS ONCE DAILY 96 tablet 1   lisinopril (ZESTRIL) 10 MG tablet Take 1 tablet (10 mg total) by mouth daily. 90 tablet 1   Multiple Vitamin (MULTIVITAMIN) tablet Take 1 tablet by mouth daily.     Omega-3 Fatty Acids (FISH OIL) 1000 MG CAPS Take by mouth.     No current facility-administered medications on file prior to visit.     Objective:  Objective  Physical Exam Constitutional:  General: She is not in acute distress.    Appearance: Normal appearance. She is not ill-appearing or toxic-appearing.  HENT:     Head: Normocephalic and atraumatic.     Right Ear: Tympanic membrane, ear canal and external ear normal.     Left Ear: Tympanic membrane, ear canal and external ear normal.     Nose: No congestion or rhinorrhea.     Mouth/Throat:     Lips: No lesions.     Mouth: Mucous membranes are dry. No injury or  lacerations.     Tongue: No lesions.  Eyes:     Extraocular Movements: Extraocular movements intact.     Pupils: Pupils are equal, round, and reactive to light.  Cardiovascular:     Rate and Rhythm: Normal rate and regular rhythm.     Pulses: Normal pulses.     Heart sounds: Normal heart sounds. No murmur heard. Pulmonary:     Effort: Pulmonary effort is normal. No respiratory distress.     Breath sounds: Normal breath sounds. No wheezing, rhonchi or rales.  Abdominal:     General: Bowel sounds are normal.     Palpations: Abdomen is soft. There is no mass.     Tenderness: There is no abdominal tenderness. There is no guarding.     Hernia: No hernia is present.  Musculoskeletal:        General: Normal range of motion.     Cervical back: Normal range of motion and neck supple.  Skin:    General: Skin is warm and dry.  Neurological:     Mental Status: She is alert and oriented to person, place, and time.  Psychiatric:        Behavior: Behavior normal.   BP 124/62   Pulse 72   Temp 98 F (36.7 C)   Resp 16   Ht 5\' 2"  (1.575 m)   Wt 162 lb 3.2 oz (73.6 kg)   SpO2 96%   BMI 29.67 kg/m  Wt Readings from Last 3 Encounters:  07/15/21 162 lb (73.5 kg)  07/15/21 162 lb 3.2 oz (73.6 kg)  02/12/21 162 lb (73.5 kg)     Lab Results  Component Value Date   WBC 5.8 12/03/2020   HGB 13.7 12/03/2020   HCT 38.6 12/03/2020   PLT 304.0 12/03/2020   GLUCOSE 87 12/18/2020   CHOL 130 12/03/2020   TRIG 141.0 12/03/2020   HDL 55.20 12/03/2020   LDLCALC 46 12/03/2020   ALT 23 12/18/2020   AST 20 12/18/2020   NA 138 12/18/2020   K 5.0 12/18/2020   CL 103 12/18/2020   CREATININE 0.81 12/18/2020   BUN 16 12/18/2020   CO2 27 12/18/2020   TSH 5.00 03/06/2021   HGBA1C 5.3 12/03/2020    DG Bone Density  Result Date: 05/06/2021 EXAM: DUAL X-RAY ABSORPTIOMETRY (DXA) FOR BONE MINERAL DENSITY IMPRESSION: Grayson A Deondrea Aguado Your patient Tyrisha Benninger completed a BMD test on 05/06/2021  using the [facility] (analysis version: 16.SP2) manufactured by [employer]. The following summarizes the results of our evaluation. [place] PATIENT: Name: Ananda, Caya Patient ID: [id number] Birth Date: 01/19/1946 Height: 63.0 in. Gender: Female Measured: 05/06/2021 Weight: 162.0 lbs. Indications: Advanced Age, Caucasian, Estrogen Deficiency, Height Loss, History of Osteoporosis, Hypothyroidism, Low Calcium Intake, Post Menopausal Fractures: Treatments: Fosamax(Alendronate), Levothyroxine, Multivitamin, Vitamin D ASSESSMENT: The BMD measured at AP Spine L2-L4 is 0.926 g/cm2 with a T-score of -2.3. This patient is considered osteopenic according to [facility] (  WHO) criteria. L- 1 was excluded due to degenerative changes. Compared with the prior study on, 11/07/2018 the BMD of the total mean shows a statistically significant increase. The scan quality is good. The patinet will not het a FRAX because she is on BBM. Site Region Measured Date Measured Age WHO YA BMD Classification T-score AP Spine L2-L4 05/06/2022 73.9 Osteopenia -2.3 0.926 g/cm2 AP Spine L2-L4 11/07/2018 71.4 Osteoporosis -2.6 0.888 g/cm2 AP Spine L2-L4 10/01/2016 69.3 Osteoporosis -2.8 0.868 g/cm2 DualFemur Total Mean 05/06/2021 73.9 Osteopenia -1.3 0.842 g/cm2 DualFemur Total Mean 11/07/2018 71.4 Osteopenia -1.5 0.815 g/cm2 DualFemur Total Mean 10/01/2016 69.3 Osteopenia -1.7 0.798 g/cm2 Right Forearm Radius 33% 05/06/2021 73.9 Normal -1.0 0.787 g/cm2 Right Forearm Radius 33% 11/07/2018 71.4 Osteopenia -1.1 0.780 g/cm2 World Health Organization [facility]) criteria for post-menopausal, Caucasian Women: Normal       T-score at or above -1 SD Osteopenia   T-score between -1 and -2.5 SD Osteoporosis T-score at or below -2.5 SD RECOMMENDATION: 1. All patients should optimize calcium and vitamin D intake. 2. Consider FDA-approved medical therapies in postmenopausal women and men aged 109 years and older, based on the following: a.  A hip or vertebral(clinical or morphometric) fracture. b. T-Score < -2.5 at the femoral neck or spine after appropriate evaluation to exclude secondary causes c. Low bone mass (T-score between -1.0 and -2.5 at the femoral neck or spine) and a 10 year probability of a hip fracture >3% or a 10 year probability of major osteoporosis-related fracture > 20% based on the [place]-adapted WHO algorithm d. Clinical judgement and/or patient preferences may indicate treatment for people with 10-year fracture probabilities above or below these levels FOLLOW-UP: Patients with diagnosis of osteoporosis or at high risk for fracture should have regular bone mineral density tests. For patients eligible for Medicare, routine testing is allowed once every 2 years. The testing frequency can be increased to one year for patients who have rapidly progressing disease, those who are receiving or discontinuing medical therapy to restore bone mass, or have additional risk factors. I have reviewed this report, and agree with the above findings. [facility] Electronically Signed   By: Kerby Moors M.D.   On: 05/06/2021 21:40     Assessment & Plan:  Plan    No orders of the defined types were placed in this encounter.   Problem List Items Addressed This Visit     HTN (hypertension)    Well controlled, no changes to meds. Encouraged heart healthy diet such as the DASH diet and exercise as tolerated.       Hyperlipidemia    Encourage heart healthy diet such as MIND or DASH diet, increase exercise, avoid trans fats, simple carbohydrates and processed foods, consider a krill or fish or flaxseed oil cap daily. Tolerating Atorvastatin      Relevant Orders   CBC with Differential/Platelet   Comprehensive metabolic panel   Lipid panel   Hypothyroidism    On Levothyroxine, continue to monitor      Relevant Orders   TSH   Hyperglycemia - Primary    hgba1c acceptable, minimize simple carbs. Increase exercise as  tolerated      Relevant Orders   Hemoglobin A1c   Annual physical exam    Patient encouraged to maintain heart healthy diet, regular exercise, adequate sleep. Consider daily probiotics. Take medications as prescribed. Labs ordered and reviewed. Given flu shot, other shots UTD Last colonoscopy 2016 told to repeat in 5 years has not proceeded. Encouraged to  do so. No pap since 2013 but no h/o abnormalities. Declines referral to GYN but agrees to pap at next visit. MGM done this year. Repeat every 1-2 years.      Vitamin D deficiency    Supplement and monitor      Osteopenia    Bone density shows osteopenia, which is thinner than normal but not as bad as osteoporosis. Recommend calcium intake of 1200 to 1500 mg daily, divided into roughly 3 doses. Best source is the diet and a single dairy serving is about 500 mg, a supplement of calcium citrate once or twice daily to balance diet is fine if not getting enough in diet. Also need Vitamin D 2000 IU caps, 1 cap daily if not already taking vitamin D. Also recommend weight baring exercise on hips and upper body to keep bones strong      Right anterior shoulder pain    Has had anterior pain with internal rotation off and on for a couple of months referred to sports med and xray ordered today      Relevant Orders   DG Shoulder Right   Ambulatory referral to Sports Medicine   Other Visit Diagnoses     Essential hypertension       Relevant Orders   CBC with Differential/Platelet   Comprehensive metabolic panel   Lipid panel   Need for influenza vaccination       Relevant Orders   Flu Vaccine QUAD High Dose(Fluad) (Completed)       Follow-up: Return in about 6 months (around 01/12/2022) for f/u visit with gyn.  I, Suezanne Jacquet, acting as a scribe for Penni Homans, MD, have documented all relevent documentation on behalf of Penni Homans, MD, as directed by Penni Homans, MD while in the presence of Penni Homans, MD. DO:07/15/21.  I, Mosie Lukes, MD personally performed the services described in this documentation. All medical record entries made by the scribe were at my direction and in my presence. I have reviewed the chart and agree that the record reflects my personal performance and is accurate and complete

## 2021-07-15 NOTE — Assessment & Plan Note (Signed)
hgba1c acceptable, minimize simple carbs. Increase exercise as tolerated.  

## 2021-07-15 NOTE — Assessment & Plan Note (Signed)

## 2021-07-15 NOTE — Assessment & Plan Note (Signed)
Symptoms seem more consistent with impingement noted on exam.  She does have a bursitis anteriorly that could be contributing.  Rotator cuff exam is reassuring. -Counseled on home exercise therapy and supportive care. -Meloxicam. -Could consider physical therapy or injection.

## 2021-07-15 NOTE — Assessment & Plan Note (Signed)
Supplement and monitor 

## 2021-07-16 LAB — COMPREHENSIVE METABOLIC PANEL
ALT: 24 U/L (ref 0–35)
AST: 22 U/L (ref 0–37)
Albumin: 4.7 g/dL (ref 3.5–5.2)
Alkaline Phosphatase: 54 U/L (ref 39–117)
BUN: 19 mg/dL (ref 6–23)
CO2: 28 mEq/L (ref 19–32)
Calcium: 10.3 mg/dL (ref 8.4–10.5)
Chloride: 102 mEq/L (ref 96–112)
Creatinine, Ser: 0.78 mg/dL (ref 0.40–1.20)
GFR: 74.95 mL/min (ref >60.00)
Glucose, Bld: 94 mg/dL (ref 70–99)
Potassium: 5 mEq/L (ref 3.5–5.1)
Sodium: 138 mEq/L (ref 135–145)
Total Bilirubin: 0.5 mg/dL (ref 0.2–1.2)
Total Protein: 7.1 g/dL (ref 6.0–8.3)

## 2021-07-16 LAB — CBC WITH DIFFERENTIAL/PLATELET
Basophils Absolute: 0.1 10*3/uL (ref 0.0–0.1)
Basophils Relative: 1.2 % (ref 0.0–3.0)
Eosinophils Absolute: 0.1 10*3/uL (ref 0.0–0.7)
Eosinophils Relative: 2 % (ref 0.0–5.0)
HCT: 38.6 % (ref 36.0–46.0)
Hemoglobin: 13 g/dL (ref 12.0–15.0)
Lymphocytes Relative: 30.7 % (ref 12.0–46.0)
Lymphs Abs: 2.2 10*3/uL (ref 0.7–4.0)
MCHC: 33.6 g/dL (ref 30.0–36.0)
MCV: 94.4 fl (ref 78.0–100.0)
Monocytes Absolute: 0.7 10*3/uL (ref 0.1–1.0)
Monocytes Relative: 9.6 % (ref 3.0–12.0)
Neutro Abs: 4 10*3/uL (ref 1.4–7.7)
Neutrophils Relative %: 56.5 % (ref 43.0–77.0)
Platelets: 328 10*3/uL (ref 150.0–400.0)
RBC: 4.08 Mil/uL (ref 3.87–5.11)
RDW: 12.9 % (ref 11.5–15.5)
WBC: 7.1 10*3/uL (ref 4.0–10.5)

## 2021-07-16 LAB — HEMOGLOBIN A1C: Hgb A1c MFr Bld: 5.2 % (ref 4.6–6.5)

## 2021-07-16 LAB — LIPID PANEL
Cholesterol: 153 mg/dL (ref 0–200)
HDL: 59.9 mg/dL (ref >39.00)
NonHDL: 93.12
Total CHOL/HDL Ratio: 3
Triglycerides: 226 mg/dL — ABNORMAL HIGH (ref 0.0–149.0)
VLDL: 45.2 mg/dL — ABNORMAL HIGH (ref 0.0–40.0)

## 2021-07-16 LAB — LDL CHOLESTEROL, DIRECT: Direct LDL: 67 mg/dL

## 2021-07-16 LAB — TSH: TSH: 0.37 u[IU]/mL (ref 0.35–5.50)

## 2021-07-18 ENCOUNTER — Other Ambulatory Visit (HOSPITAL_BASED_OUTPATIENT_CLINIC_OR_DEPARTMENT_OTHER): Payer: Self-pay

## 2021-07-18 MED ORDER — PFIZER COVID-19 VAC BIVALENT 30 MCG/0.3ML IM SUSP
INTRAMUSCULAR | 0 refills | Status: DC
  Filled 2021-07-18: qty 0.3, 1d supply, fill #0

## 2021-08-13 ENCOUNTER — Other Ambulatory Visit: Payer: Self-pay | Admitting: Family Medicine

## 2021-08-13 DIAGNOSIS — I10 Essential (primary) hypertension: Secondary | ICD-10-CM

## 2021-08-14 ENCOUNTER — Ambulatory Visit: Payer: Medicare HMO | Admitting: Family Medicine

## 2021-08-14 ENCOUNTER — Encounter: Payer: Self-pay | Admitting: Family Medicine

## 2021-08-14 DIAGNOSIS — M7541 Impingement syndrome of right shoulder: Secondary | ICD-10-CM | POA: Diagnosis not present

## 2021-08-14 NOTE — Assessment & Plan Note (Signed)
Has been doing well with the home exercises. -Counseled on home exercise therapy and supportive care. -Could consider physical therapy.

## 2021-08-14 NOTE — Progress Notes (Signed)
Michelle Jacobs - 75 y.o. female MRN [id number]  Date of birth: 1946-04-07  SUBJECTIVE:  Including CC & ROS.  No chief complaint on file.   Michelle Jacobs is a 75 y.o. female that is following up for her right shoulder pain.  She has been doing the home exercises and reports no pain today.   Review of Systems See HPI   HISTORY: Past Medical, Surgical, Social, and Family History Reviewed & Updated per EMR.   Pertinent Historical Findings include:  Past Medical History:  Diagnosis Date   Allergy    Annual physical exam 10/06/2015   GERD (gastroesophageal reflux disease)    mild   History of chicken pox    Hyperglycemia 05/20/2013   Hyperlipidemia    Hypertension    Medicare annual wellness visit, subsequent 12/02/2011   Personal history of colonic polyps 2009   Benign colon polyps   Preventative health care 12/02/2011   Verrucous skin lesion 11/04/2016    History reviewed. No pertinent surgical history.  Family History  Problem Relation Age of Onset   Cancer Mother        lung   Hypertension Mother    Heart disease Father        CHF   Diabetes Maternal Uncle        type II   Heart disease Maternal Uncle        s/p CABG   Cancer Maternal Grandmother 42       breast   Heart disease Maternal Grandmother        MI, CHF   Obesity Maternal Grandmother    Breast cancer Maternal Grandmother    Cancer Sister        uterine   Hypertension Sister    Hyperlipidemia Brother    Hypertension Brother    Cancer Brother        HPV associated oral cancer   Hearing loss Brother    Kidney disease Maternal Grandfather        kidney failure   Cancer Paternal Grandmother    COPD Paternal Grandfather    Diabetes Maternal Aunt        older, type 2    Social History   Socioeconomic History   Marital status: Married    Spouse name: Not on file   Number of children: 2   Years of education: Not on file   Highest education level: Not on file  Occupational History     Employer: [employer]  Tobacco Use   Smoking status: Never   Smokeless tobacco: Never  Substance and Sexual Activity   Alcohol use: Yes    Alcohol/week: 10.0 standard drinks    Types: 10 Glasses of wine per week   Drug use: No   Sexual activity: Not on file    Comment: lives with husband, no dietary restrictions  Other Topics Concern   Not on file  Social History Narrative   Caffeine use:  2-3 cups coffee daily   Regular exercise:  Walks daily         Social Determinants of Health   Financial Resource Strain: Low Risk    Difficulty of Paying Living Expenses: Not hard at all  Food Insecurity: No Food Insecurity   Worried About Charity fundraiser in the Last Year: Never true   Ran Out of Food in the Last Year: Never true  Transportation Needs: No Transportation Needs   Lack of Transportation (Medical): No   Lack of  Transportation (Non-Medical): No  Physical Activity: Sufficiently Active   Days of Exercise per Week: 5 days   Minutes of Exercise per Session: 60 min  Stress: No Stress Concern Present   Feeling of Stress : Not at all  Social Connections: Moderately Isolated   Frequency of Communication with Friends and Family: More than three times a week   Frequency of Social Gatherings with Friends and Family: More than three times a week   Attends Religious Services: Never   Marine scientist or Organizations: No   Attends Music therapist: Never   Marital Status: Married  Human resources officer Violence: Not At Risk   Fear of Current or Ex-Partner: No   Emotionally Abused: No   Physically Abused: No   Sexually Abused: No     PHYSICAL EXAM:  VS: BP 140/80 (BP Location: Left Arm, Patient Position: Sitting)    Ht 5\' 2"  (1.575 m)    Wt 162 lb (73.5 kg)    BMI 29.63 kg/m  Physical Exam Gen: NAD, alert, cooperative with exam, well-appearing    ASSESSMENT & PLAN:   Subacromial impingement of right shoulder Has been doing well with the home  exercises. -Counseled on home exercise therapy and supportive care. -Could consider physical therapy.

## 2021-09-02 ENCOUNTER — Telehealth: Payer: Self-pay

## 2021-09-02 NOTE — Telephone Encounter (Signed)
Prolia VOB initiated via [email or web address]  Last OV:  Next OV:  Last Prolia inj: 04/03/21 Next Prolia inj DUE: 10/05/21

## 2021-09-12 NOTE — Telephone Encounter (Signed)
Please contact pt to update insurance so benefits for Prolia can be checked, thanks!

## 2021-09-16 NOTE — Telephone Encounter (Signed)
Called patient left message to return call to office to update insurance information for prolia

## 2021-09-17 NOTE — Telephone Encounter (Signed)
Spoke with pt and verified health insurance is still [employer].

## 2021-09-18 NOTE — Telephone Encounter (Signed)
VOB resubmitted for Prolia.

## 2021-09-22 ENCOUNTER — Telehealth: Payer: Self-pay | Admitting: Family Medicine

## 2021-09-22 NOTE — Telephone Encounter (Signed)
[place] contacted ov regarding ICD.10 code for prolia. [phone]. Please advise.

## 2021-09-23 NOTE — Telephone Encounter (Signed)
[place], Corshema N 21 hours ago (10:48 AM)   CB [place] contacted ov regarding ICD.10 code for prolia. [phone]. Please advise.

## 2021-09-29 NOTE — Telephone Encounter (Addendum)
PA# [id number]

## 2021-09-29 NOTE — Telephone Encounter (Signed)
Prior auth required for PROLIA  PA PROCESS DETAILS: Precertification is required. Call [phone] or complete the Precertification form available at [email or web address] 

## 2021-10-01 NOTE — Telephone Encounter (Signed)
PA approved from 09/29/2021 to 09/29/2022 authorization number: [id number]

## 2021-10-01 NOTE — Telephone Encounter (Signed)
Pt ready for scheduling on or after 10/05/21  Out-of-pocket cost due at time of visit: $301  Primary: Aetna Medicare Prolia co-insurance: 20% (approximately $276) Admin fee co-insurance: 20% (approximately $25)  Secondary: n/a Prolia co-insurance:  Admin fee co-insurance:   Deductible: does not apply  Prior Auth: APPROVED PA# [id number] Valid: 09/29/21/-09/29/22  ** This summary of benefits is an estimation of the patient's out-of-pocket cost. Exact cost may vary based on individual plan coverage.

## 2021-10-06 ENCOUNTER — Encounter: Payer: Self-pay | Admitting: Family Medicine

## 2021-10-07 NOTE — Telephone Encounter (Signed)
Left vm to return call.    

## 2021-10-10 ENCOUNTER — Ambulatory Visit: Payer: Medicare HMO

## 2021-10-10 DIAGNOSIS — M810 Age-related osteoporosis without current pathological fracture: Secondary | ICD-10-CM

## 2021-10-10 MED ORDER — DENOSUMAB 60 MG/ML ~~LOC~~ SOSY
60.0000 mg | PREFILLED_SYRINGE | Freq: Once | SUBCUTANEOUS | Status: AC
  Administered 2021-10-10: 60 mg via SUBCUTANEOUS

## 2021-10-10 NOTE — Progress Notes (Signed)
Michelle Jacobs is a 76 y.o. female presents to the office today for prolia injections, per physician's orders. Original order: 01/29/2017 denosumab (med), 60 mg/1 mL (dose),  SQ (route) was administered left upper arm (location) today. Patient tolerated injection. Patient due for follow up labs/provider appt: No. Date due: 03/2022 appt made Yes Patient next injection due: 6 months 04/10/22

## 2021-10-21 NOTE — Telephone Encounter (Signed)
Last Prolia inj 10/10/21 Next Prolia inj due 04/10/22

## 2021-10-24 ENCOUNTER — Other Ambulatory Visit: Payer: Self-pay | Admitting: Family Medicine

## 2022-01-13 ENCOUNTER — Other Ambulatory Visit: Payer: Self-pay | Admitting: Family Medicine

## 2022-01-13 DIAGNOSIS — I10 Essential (primary) hypertension: Secondary | ICD-10-CM

## 2022-01-15 ENCOUNTER — Ambulatory Visit: Payer: Medicare HMO | Admitting: Family Medicine

## 2022-02-17 ENCOUNTER — Telehealth: Payer: Self-pay | Admitting: Family Medicine

## 2022-02-17 NOTE — Telephone Encounter (Signed)
Left message for patient to call back and schedule Medicare Annual Wellness Visit (AWV).   Please offer to do virtually or by telephone.  Left office number and my jabber #[phone].  Last AWV:02/12/2021  Please schedule at anytime with Nurse Health Advisor.   

## 2022-02-28 NOTE — Telephone Encounter (Signed)
Prolia VOB initiated via [email or web address]  Last Prolia inj 10/10/21 Next Prolia inj due 04/10/22   Prior Auth: APPROVED PA# [id number] Valid: 09/29/21/-09/29/22

## 2022-03-22 NOTE — Telephone Encounter (Signed)
Pt ready for scheduling on or after 04/10/22   Out-of-pocket cost due at time of visit: $301   Primary: Aetna Medicare Adv Prolia co-insurance: 20% (approximately $276) Admin fee co-insurance: 20% (approximately $25)   Secondary: n/a Prolia co-insurance:  Admin fee co-insurance:    Deductible: does not apply   Prior Auth: APPROVED PA# [id number] Valid: 09/29/21/-09/29/22   ** This summary of benefits is an estimation of the patient's out-of-pocket cost. Exact cost may vary based on individual plan coverage.

## 2022-03-26 NOTE — Telephone Encounter (Signed)
Pt aware and is scheduled

## 2022-04-10 ENCOUNTER — Ambulatory Visit (INDEPENDENT_AMBULATORY_CARE_PROVIDER_SITE_OTHER): Payer: Medicare HMO

## 2022-04-10 DIAGNOSIS — M810 Age-related osteoporosis without current pathological fracture: Secondary | ICD-10-CM | POA: Diagnosis not present

## 2022-04-10 MED ORDER — DENOSUMAB 60 MG/ML ~~LOC~~ SOSY
60.0000 mg | PREFILLED_SYRINGE | Freq: Once | SUBCUTANEOUS | Status: AC
  Administered 2022-04-10: 60 mg via SUBCUTANEOUS

## 2022-04-10 NOTE — Progress Notes (Signed)
Michelle Jacobs is a 76 y.o. female presents to the office today for prolia injections, per physician's orders. Original order: 01/29/2017 denosumab (med), 60 mg/1 mL (dose),  SQ (route) was administered left upper arm (location) today. Patient tolerated injection. Patient due for follow up labs/provider appt: No.  appt made Yes Patient next injection due: 6 months 10/13/22

## 2022-04-23 NOTE — Telephone Encounter (Signed)
Last Prolia inj 04/10/22 Next Prolia inj due 10/12/22

## 2022-05-12 ENCOUNTER — Ambulatory Visit: Payer: Medicare HMO | Admitting: Family Medicine

## 2022-05-12 ENCOUNTER — Ambulatory Visit (INDEPENDENT_AMBULATORY_CARE_PROVIDER_SITE_OTHER): Payer: Medicare HMO | Admitting: Family Medicine

## 2022-05-12 ENCOUNTER — Encounter: Payer: Self-pay | Admitting: Family Medicine

## 2022-05-12 VITALS — BP 124/70 | HR 62 | Temp 98.1°F | Resp 18 | Ht 62.0 in | Wt 159.8 lb

## 2022-05-12 DIAGNOSIS — E782 Mixed hyperlipidemia: Secondary | ICD-10-CM | POA: Diagnosis not present

## 2022-05-12 DIAGNOSIS — Z23 Encounter for immunization: Secondary | ICD-10-CM

## 2022-05-12 DIAGNOSIS — I10 Essential (primary) hypertension: Secondary | ICD-10-CM

## 2022-05-12 DIAGNOSIS — E039 Hypothyroidism, unspecified: Secondary | ICD-10-CM | POA: Diagnosis not present

## 2022-05-12 DIAGNOSIS — D229 Melanocytic nevi, unspecified: Secondary | ICD-10-CM | POA: Diagnosis not present

## 2022-05-12 MED ORDER — LISINOPRIL 10 MG PO TABS: 10.0000 mg | ORAL_TABLET | Freq: Every day | ORAL | 1 refills | Status: DC

## 2022-05-12 MED ORDER — LEVOTHYROXINE SODIUM 150 MCG PO TABS: ORAL_TABLET | ORAL | 1 refills | Status: DC

## 2022-05-12 MED ORDER — ATORVASTATIN CALCIUM 10 MG PO TABS: 10.0000 mg | ORAL_TABLET | Freq: Every day | ORAL | 1 refills | Status: DC

## 2022-05-12 NOTE — Assessment & Plan Note (Signed)
Check labs 

## 2022-05-12 NOTE — Assessment & Plan Note (Signed)
Well controlled, no changes to meds. Encouraged heart healthy diet such as the DASH diet and exercise as tolerated.  °

## 2022-05-12 NOTE — Assessment & Plan Note (Signed)
Encourage heart healthy diet such as MIND or DASH diet, increase exercise, avoid trans fats, simple carbohydrates and processed foods, consider a krill or fish or flaxseed oil cap daily.  °

## 2022-05-12 NOTE — Patient Instructions (Signed)

## 2022-05-12 NOTE — Progress Notes (Signed)
Subjective:   By signing my name below, I, [name], attest that this documentation has been prepared under the direction and in the presence of [name], DO  05/12/2022    Patient ID: Michelle Jacobs, female    DOB: 1946-08-12, 76 y.o.   MRN: [id number]  Chief Complaint  Patient presents with   Hypothyroidism   Hypertension   Hyperlipidemia   Follow-up    Hypertension Pertinent negatives include no blurred vision, chest pain, headaches, malaise/fatigue, palpitations or shortness of breath.  Hyperlipidemia Pertinent negatives include no chest pain or shortness of breath.   Patient is in today for a office visit.   She reports having a dark crusty spot on her back. She notes it recently developed the darker color change. She noticed it while on the beach recently. She has seen a dermatologist in the past when it was just a crusty spot.  She is requesting a refill on 10 mg lisinopril, 10 mg Lipitor, 150 mcg levothyroxine. She is no longer taking 7.5 mg meloxicam.  Her blood pressure is doing well during this visit. She continues taking 10 mg lisinopril daily PO and reports no new issues while taking it. She denies any swelling in her ankles.  BP Readings from Last 3 Encounters:  05/12/22 124/70  08/14/21 140/80  07/15/21 140/72   Pulse Readings from Last 3 Encounters:  05/12/22 62  07/15/21 72  12/03/20 73   She is due for a tetanus vaccine. She is willing to receive it at her pharmacy.   Past Medical History:  Diagnosis Date   Allergy    Annual physical exam 10/06/2015   GERD (gastroesophageal reflux disease)    mild   History of chicken pox    Hyperglycemia 05/20/2013   Hyperlipidemia    Hypertension    Medicare annual wellness visit, subsequent 12/02/2011   Personal history of colonic polyps 2009   Benign colon polyps   Preventative health care 12/02/2011   Verrucous skin lesion 11/04/2016    History reviewed. No pertinent surgical  history.  Family History  Problem Relation Age of Onset   Cancer Mother        lung   Hypertension Mother    Heart disease Father        CHF   Diabetes Maternal Uncle        type II   Heart disease Maternal Uncle        s/p CABG   Cancer Maternal Grandmother 42       breast   Heart disease Maternal Grandmother        MI, CHF   Obesity Maternal Grandmother    Breast cancer Maternal Grandmother    Cancer Sister        uterine   Hypertension Sister    Hyperlipidemia Brother    Hypertension Brother    Cancer Brother        HPV associated oral cancer   Hearing loss Brother    Kidney disease Maternal Grandfather        kidney failure   Cancer Paternal Grandmother    COPD Paternal Grandfather    Diabetes Maternal Aunt        older, type 2    Social History   Socioeconomic History   Marital status: Married    Spouse name: Not on file   Number of children: 2   Years of education: Not on file   Highest education level: Not on file  Occupational History    Employer: [employer]  Tobacco Use   Smoking status: Never   Smokeless tobacco: Never  Substance and Sexual Activity   Alcohol use: Yes    Alcohol/week: 10.0 standard drinks of alcohol    Types: 10 Glasses of wine per week   Drug use: No   Sexual activity: Not on file    Comment: lives with husband, no dietary restrictions  Other Topics Concern   Not on file  Social History Narrative   Caffeine use:  2-3 cups coffee daily   Regular exercise:  Walks daily         Social Determinants of Health   Financial Resource Strain: Low Risk  (02/12/2021)   Overall Financial Resource Strain (CARDIA)    Difficulty of Paying Living Expenses: Not hard at all  Food Insecurity: No Food Insecurity (02/12/2021)   Hunger Vital Sign    Worried About Running Out of Food in the Last Year: Never true    Ran Out of Food in the Last Year: Never true  Transportation Needs: No Transportation Needs (02/12/2021)   PRAPARE -  Hydrologist (Medical): No    Lack of Transportation (Non-Medical): No  Physical Activity: Sufficiently Active (02/12/2021)   Exercise Vital Sign    Days of Exercise per Week: 5 days    Minutes of Exercise per Session: 60 min  Stress: No Stress Concern Present (02/12/2021)   [place]    Feeling of Stress : Not at all  Social Connections: Moderately Isolated (02/12/2021)   Social Connection and Isolation Panel [NHANES]    Frequency of Communication with Friends and Family: More than three times a week    Frequency of Social Gatherings with Friends and Family: More than three times a week    Attends Religious Services: Never    Marine scientist or Organizations: No    Attends Archivist Meetings: Never    Marital Status: Married  Human resources officer Violence: Not At Risk (02/12/2021)   Humiliation, Afraid, Rape, and Kick questionnaire    Fear of Current or Ex-Partner: No    Emotionally Abused: No    Physically Abused: No    Sexually Abused: No    Outpatient Medications Prior to Visit  Medication Sig Dispense Refill   cetirizine (ZYRTEC) 10 MG tablet Take 10 mg by mouth daily.     Cholecalciferol (VITAMIN D3) 125 MCG (5000 UT) CAPS Take by mouth. 1 cap 4 times a week.     meloxicam (MOBIC) 7.5 MG tablet Take 1 tablet (7.5 mg total) by mouth 2 (two) times daily as needed. 45 tablet 1   Multiple Vitamin (MULTIVITAMIN) tablet Take 1 tablet by mouth daily.     Omega-3 Fatty Acids (FISH OIL) 1000 MG CAPS Take by mouth.     atorvastatin (LIPITOR) 10 MG tablet TAKE 1 TABLET DAILY 90 tablet 1   levothyroxine (SYNTHROID) 150 MCG tablet TAKE 1 TABLET DAILY EXCEPT SATURDAY TAKE 1 AND 1/2      TABLETS ONCE DAILY 96 tablet 1   lisinopril (ZESTRIL) 10 MG tablet TAKE 1 TABLET DAILY 90 tablet 1   COVID-19 mRNA bivalent vaccine, Pfizer, (PFIZER COVID-19 VAC BIVALENT) injection Inject into the  muscle. (Patient not taking: Reported on 05/12/2022) 0.3 mL 0   No facility-administered medications prior to visit.    Allergies  Allergen Reactions   Codeine Nausea Only   Iodine Rash  Mercurochrome [Merbromin] Rash   Sulfa Antibiotics Rash    Review of Systems  Constitutional:  Negative for fever and malaise/fatigue.  HENT:  Negative for congestion.   Eyes:  Negative for blurred vision.  Respiratory:  Negative for shortness of breath.   Cardiovascular:  Negative for chest pain, palpitations and leg swelling.  Gastrointestinal:  Negative for abdominal pain, blood in stool and nausea.  Genitourinary:  Negative for dysuria and frequency.  Musculoskeletal:  Negative for falls.  Skin:  Negative for rash.       (+)dark crusty spot on back  Neurological:  Negative for dizziness, loss of consciousness and headaches.  Endo/Heme/Allergies:  Negative for environmental allergies.  Psychiatric/Behavioral:  Negative for depression. The patient is not nervous/anxious.        Objective:    Physical Exam Vitals and nursing note reviewed.  Constitutional:      General: She is not in acute distress.    Appearance: Normal appearance. She is not ill-appearing.  HENT:     Head: Normocephalic and atraumatic.     Right Ear: External ear normal.     Left Ear: External ear normal.  Eyes:     Extraocular Movements: Extraocular movements intact.     Pupils: Pupils are equal, round, and reactive to light.  Cardiovascular:     Rate and Rhythm: Normal rate and regular rhythm.     Heart sounds: Normal heart sounds. No murmur heard.    No gallop.  Pulmonary:     Effort: Pulmonary effort is normal. No respiratory distress.     Breath sounds: Normal breath sounds. No wheezing or rales.  Skin:    General: Skin is warm and dry.     Comments: Lesion behind the knee dark brown, raised, 0.5 cm, rough. Irregular, dark rough, large circular lesion around bra line   Neurological:     Mental Status:  She is alert and oriented to person, place, and time.  Psychiatric:        Judgment: Judgment normal.     BP 124/70 (BP Location: Left Arm, Patient Position: Sitting, Cuff Size: Large)   Pulse 62   Temp 98.1 F (36.7 C) (Oral)   Resp 18   Ht '5\' 2"'$  (1.575 m)   Wt 159 lb 12.8 oz (72.5 kg)   SpO2 96%   BMI 29.23 kg/m  Wt Readings from Last 3 Encounters:  05/12/22 159 lb 12.8 oz (72.5 kg)  08/14/21 162 lb (73.5 kg)  07/15/21 162 lb (73.5 kg)    Diabetic Foot Exam - Simple   No data filed    Lab Results  Component Value Date   WBC 7.1 07/15/2021   HGB 13.0 07/15/2021   HCT 38.6 07/15/2021   PLT 328.0 07/15/2021   GLUCOSE 94 07/15/2021   CHOL 153 07/15/2021   TRIG 226.0 (H) 07/15/2021   HDL 59.90 07/15/2021   LDLDIRECT 67.0 07/15/2021   LDLCALC 46 12/03/2020   ALT 24 07/15/2021   AST 22 07/15/2021   NA 138 07/15/2021   K 5.0 07/15/2021   CL 102 07/15/2021   CREATININE 0.78 07/15/2021   BUN 19 07/15/2021   CO2 28 07/15/2021   TSH 0.37 07/15/2021   HGBA1C 5.2 07/15/2021    Lab Results  Component Value Date   TSH 0.37 07/15/2021   Lab Results  Component Value Date   WBC 7.1 07/15/2021   HGB 13.0 07/15/2021   HCT 38.6 07/15/2021   MCV 94.4 07/15/2021   PLT 328.0  07/15/2021   Lab Results  Component Value Date   NA 138 07/15/2021   K 5.0 07/15/2021   CO2 28 07/15/2021   GLUCOSE 94 07/15/2021   BUN 19 07/15/2021   CREATININE 0.78 07/15/2021   BILITOT 0.5 07/15/2021   ALKPHOS 54 07/15/2021   AST 22 07/15/2021   ALT 24 07/15/2021   PROT 7.1 07/15/2021   ALBUMIN 4.7 07/15/2021   CALCIUM 10.3 07/15/2021   GFR 74.95 07/15/2021   Lab Results  Component Value Date   CHOL 153 07/15/2021   Lab Results  Component Value Date   HDL 59.90 07/15/2021   Lab Results  Component Value Date   LDLCALC 46 12/03/2020   Lab Results  Component Value Date   TRIG 226.0 (H) 07/15/2021   Lab Results  Component Value Date   CHOLHDL 3 07/15/2021   Lab Results   Component Value Date   HGBA1C 5.2 07/15/2021       Assessment & Plan:   Problem List Items Addressed This Visit       Unprioritized   Hypothyroidism    Check labs        Relevant Medications   levothyroxine (SYNTHROID) 150 MCG tablet   Other Relevant Orders   CBC with Differential/Platelet   Comprehensive metabolic panel   Lipid panel   TSH   Hyperlipidemia    Encourage heart healthy diet such as MIND or DASH diet, increase exercise, avoid trans fats, simple carbohydrates and processed foods, consider a krill or fish or flaxseed oil cap daily.       Relevant Medications   atorvastatin (LIPITOR) 10 MG tablet   lisinopril (ZESTRIL) 10 MG tablet   Other Relevant Orders   CBC with Differential/Platelet   Comprehensive metabolic panel   Lipid panel   TSH   HTN (hypertension)    Well controlled, no changes to meds. Encouraged heart healthy diet such as the DASH diet and exercise as tolerated.       Relevant Medications   atorvastatin (LIPITOR) 10 MG tablet   lisinopril (ZESTRIL) 10 MG tablet   Other Visit Diagnoses     Essential hypertension    -  Primary   Relevant Medications   atorvastatin (LIPITOR) 10 MG tablet   lisinopril (ZESTRIL) 10 MG tablet   Other Relevant Orders   CBC with Differential/Platelet   Comprehensive metabolic panel   Lipid panel   TSH   Suspicious nevus       Relevant Orders   Ambulatory referral to Dermatology   Need for influenza vaccination       Relevant Orders   Flu Vaccine QUAD High Dose(Fluad) (Completed)        Meds ordered this encounter  Medications   atorvastatin (LIPITOR) 10 MG tablet    Sig: Take 1 tablet (10 mg total) by mouth daily.    Dispense:  90 tablet    Refill:  1   levothyroxine (SYNTHROID) 150 MCG tablet    Sig: 1 po qd    Dispense:  96 tablet    Refill:  1   lisinopril (ZESTRIL) 10 MG tablet    Sig: Take 1 tablet (10 mg total) by mouth daily.    Dispense:  90 tablet    Refill:  1    I, [name], DO, personally preformed the services described in this documentation.  All medical record entries made by the scribe were at my direction and in my presence.  I have reviewed the  chart and discharge instructions (if applicable) and agree that the record reflects my personal performance and is accurate and complete. 05/12/2022   I,[name],acting as a Education administrator for [employer], DO.,have documented all relevant documentation on the behalf of [name], DO,as directed by  [name], DO while in the presence of [name], DO.   [name], DO

## 2022-05-13 LAB — CBC WITH DIFFERENTIAL/PLATELET
Basophils Absolute: 0.1 10*3/uL (ref 0.0–0.1)
Basophils Relative: 1.1 % (ref 0.0–3.0)
Eosinophils Absolute: 0.2 10*3/uL (ref 0.0–0.7)
Eosinophils Relative: 2.8 % (ref 0.0–5.0)
HCT: 37.9 % (ref 36.0–46.0)
Hemoglobin: 13.1 g/dL (ref 12.0–15.0)
Lymphocytes Relative: 38.7 % (ref 12.0–46.0)
Lymphs Abs: 3 10*3/uL (ref 0.7–4.0)
MCHC: 34.6 g/dL (ref 30.0–36.0)
MCV: 93.9 fl (ref 78.0–100.0)
Monocytes Absolute: 0.8 10*3/uL (ref 0.1–1.0)
Monocytes Relative: 11.1 % (ref 3.0–12.0)
Neutro Abs: 3.5 10*3/uL (ref 1.4–7.7)
Neutrophils Relative %: 46.3 % (ref 43.0–77.0)
Platelets: 309 10*3/uL (ref 150.0–400.0)
RBC: 4.04 Mil/uL (ref 3.87–5.11)
RDW: 12.4 % (ref 11.5–15.5)
WBC: 7.6 10*3/uL (ref 4.0–10.5)

## 2022-05-13 LAB — COMPREHENSIVE METABOLIC PANEL
ALT: 35 U/L (ref 0–35)
AST: 31 U/L (ref 0–37)
Albumin: 4.5 g/dL (ref 3.5–5.2)
Alkaline Phosphatase: 62 U/L (ref 39–117)
BUN: 18 mg/dL (ref 6–23)
CO2: 27 mEq/L (ref 19–32)
Calcium: 10.4 mg/dL (ref 8.4–10.5)
Chloride: 102 mEq/L (ref 96–112)
Creatinine, Ser: 0.88 mg/dL (ref 0.40–1.20)
GFR: 64.48 mL/min (ref >60.00)
Glucose, Bld: 87 mg/dL (ref 70–99)
Potassium: 5 mEq/L (ref 3.5–5.1)
Sodium: 138 mEq/L (ref 135–145)
Total Bilirubin: 0.4 mg/dL (ref 0.2–1.2)
Total Protein: 7.1 g/dL (ref 6.0–8.3)

## 2022-05-13 LAB — LIPID PANEL
Cholesterol: 130 mg/dL (ref 0–200)
HDL: 61.8 mg/dL (ref >39.00)
LDL Cholesterol: 50 mg/dL (ref 0–99)
NonHDL: 68.24
Total CHOL/HDL Ratio: 2
Triglycerides: 89 mg/dL (ref 0.0–149.0)
VLDL: 17.8 mg/dL (ref 0.0–40.0)

## 2022-05-13 LAB — TSH: TSH: 0.32 u[IU]/mL — ABNORMAL LOW (ref 0.35–5.50)

## 2022-05-19 DIAGNOSIS — D1801 Hemangioma of skin and subcutaneous tissue: Secondary | ICD-10-CM | POA: Diagnosis not present

## 2022-05-19 DIAGNOSIS — L821 Other seborrheic keratosis: Secondary | ICD-10-CM | POA: Diagnosis not present

## 2022-05-19 DIAGNOSIS — D225 Melanocytic nevi of trunk: Secondary | ICD-10-CM | POA: Diagnosis not present

## 2022-05-19 DIAGNOSIS — L80 Vitiligo: Secondary | ICD-10-CM | POA: Diagnosis not present

## 2022-05-20 ENCOUNTER — Other Ambulatory Visit: Payer: Self-pay

## 2022-05-20 MED ORDER — LEVOTHYROXINE SODIUM 137 MCG PO TABS: 137.0000 ug | ORAL_TABLET | Freq: Every day | ORAL | 1 refills | Status: DC

## 2022-05-21 ENCOUNTER — Other Ambulatory Visit: Payer: Self-pay | Admitting: Family Medicine

## 2022-06-23 ENCOUNTER — Other Ambulatory Visit (HOSPITAL_BASED_OUTPATIENT_CLINIC_OR_DEPARTMENT_OTHER): Payer: Self-pay

## 2022-06-23 MED ORDER — COMIRNATY 30 MCG/0.3ML IM SUSY
PREFILLED_SYRINGE | INTRAMUSCULAR | 0 refills | Status: DC
  Filled 2022-06-23: qty 0.3, 1d supply, fill #0

## 2022-09-03 ENCOUNTER — Other Ambulatory Visit (HOSPITAL_BASED_OUTPATIENT_CLINIC_OR_DEPARTMENT_OTHER): Payer: Self-pay

## 2022-09-03 MED ORDER — AREXVY 120 MCG/0.5ML IM SUSR
INTRAMUSCULAR | 0 refills | Status: DC
  Filled 2022-09-03: qty 1, 1d supply, fill #0

## 2022-09-11 NOTE — Telephone Encounter (Signed)
Prolia VOB initiated via [email or web address]  Last Prolia inj 04/10/22 Next Prolia inj due 10/12/22

## 2022-10-01 NOTE — Telephone Encounter (Signed)
Prior auth required for PROLIA  PA PROCESS DETAILS: Precertification is required. Call [phone] or complete the Precertification form available at [email or web address] 

## 2022-10-07 ENCOUNTER — Telehealth: Payer: Medicare HMO | Admitting: *Deleted

## 2022-10-07 NOTE — Telephone Encounter (Signed)
Pt has been scheduled on 10/13/22.    Looks like benefits was started but not completed.  Can you please check on this?  Please see phone note from 09/02/21.

## 2022-10-12 ENCOUNTER — Other Ambulatory Visit (HOSPITAL_COMMUNITY): Payer: Self-pay

## 2022-10-12 NOTE — Telephone Encounter (Signed)
  Tried to submit PA for Prolia through [employer], came back that patient does not have medical coverage under the plan.

## 2022-10-12 NOTE — Telephone Encounter (Signed)
Pharmacy Patient Advocate Encounter  Insurance verification completed.    The patient is insured through [employer] test claims for: Prolia 19m.  Pharmacy benefit copay: $100.00

## 2022-10-13 ENCOUNTER — Other Ambulatory Visit: Payer: Self-pay

## 2022-10-13 ENCOUNTER — Other Ambulatory Visit: Payer: Self-pay | Admitting: *Deleted

## 2022-10-13 ENCOUNTER — Other Ambulatory Visit (HOSPITAL_COMMUNITY): Payer: Self-pay

## 2022-10-13 ENCOUNTER — Ambulatory Visit: Payer: Medicare HMO

## 2022-10-13 MED ORDER — DENOSUMAB 60 MG/ML ~~LOC~~ SOSY
PREFILLED_SYRINGE | SUBCUTANEOUS | 0 refills | Status: DC
  Filled 2022-10-13: qty 1, fill #0
  Filled 2022-10-13: qty 1, 180d supply, fill #0

## 2022-10-13 NOTE — Telephone Encounter (Signed)
Pt rescheduled for 10/20/22.  She will get from pharmacy.

## 2022-10-14 ENCOUNTER — Other Ambulatory Visit: Payer: Self-pay

## 2022-10-15 ENCOUNTER — Other Ambulatory Visit (HOSPITAL_COMMUNITY): Payer: Self-pay

## 2022-10-15 NOTE — Telephone Encounter (Signed)
Patient Advocate Encounter  Prior Authorization for Prolia 31m has been approved.    PA# [id number] dates: 10/13/22 through 10/14/2023

## 2022-10-19 NOTE — Telephone Encounter (Signed)
Prior Authorization initiated for [facility] via [employer] Case ID: [id number], [id number]

## 2022-10-19 NOTE — Telephone Encounter (Signed)
APPROVED PA# [id number] Valid: 10/13/22-10/14/23

## 2022-10-20 ENCOUNTER — Telehealth: Payer: Self-pay | Admitting: *Deleted

## 2022-10-20 ENCOUNTER — Ambulatory Visit (INDEPENDENT_AMBULATORY_CARE_PROVIDER_SITE_OTHER): Payer: Medicare HMO | Admitting: *Deleted

## 2022-10-20 DIAGNOSIS — M810 Age-related osteoporosis without current pathological fracture: Secondary | ICD-10-CM | POA: Diagnosis not present

## 2022-10-20 MED ORDER — DENOSUMAB 60 MG/ML ~~LOC~~ SOSY
60.0000 mg | PREFILLED_SYRINGE | Freq: Once | SUBCUTANEOUS | Status: AC
  Administered 2022-10-20: 60 mg via SUBCUTANEOUS

## 2022-10-20 NOTE — Telephone Encounter (Signed)
Prolia given today. 

## 2022-10-20 NOTE — Progress Notes (Signed)
Patient here for prolia injection per physicians orders.  Injection given left subq and patient tolerated well.  Patient wanted to go ahead and schedule next appointment.  Appointment made but note put in to make sure benefits was ran.

## 2022-10-27 NOTE — Telephone Encounter (Signed)
Forwarding to Rx Prior Auth Team 

## 2022-11-09 NOTE — Assessment & Plan Note (Signed)
Encouraged to get adequate exercise, calcium and vitamin d intake 

## 2022-11-09 NOTE — Assessment & Plan Note (Signed)
Encourage heart healthy diet such as MIND or DASH diet, increase exercise, avoid trans fats, simple carbohydrates and processed foods, consider a krill or fish or flaxseed oil cap daily.  °

## 2022-11-09 NOTE — Assessment & Plan Note (Signed)
On Levothyroxine, continue to monitor 

## 2022-11-09 NOTE — Assessment & Plan Note (Signed)
hgba1c acceptable, minimize simple carbs. Increase exercise as tolerated.  

## 2022-11-09 NOTE — Assessment & Plan Note (Signed)
Well controlled, no changes to meds. Encouraged heart healthy diet such as the DASH diet and exercise as tolerated.  °

## 2022-11-09 NOTE — Assessment & Plan Note (Signed)
Supplement and monitor 

## 2022-11-10 ENCOUNTER — Ambulatory Visit (INDEPENDENT_AMBULATORY_CARE_PROVIDER_SITE_OTHER): Payer: Medicare HMO | Admitting: Family Medicine

## 2022-11-10 ENCOUNTER — Encounter: Payer: Self-pay | Admitting: Family Medicine

## 2022-11-10 VITALS — BP 126/68 | HR 65 | Temp 97.5°F | Resp 16 | Ht 62.0 in | Wt 166.2 lb

## 2022-11-10 DIAGNOSIS — E039 Hypothyroidism, unspecified: Secondary | ICD-10-CM

## 2022-11-10 DIAGNOSIS — E782 Mixed hyperlipidemia: Secondary | ICD-10-CM

## 2022-11-10 DIAGNOSIS — M8580 Other specified disorders of bone density and structure, unspecified site: Secondary | ICD-10-CM

## 2022-11-10 DIAGNOSIS — I10 Essential (primary) hypertension: Secondary | ICD-10-CM | POA: Diagnosis not present

## 2022-11-10 DIAGNOSIS — E559 Vitamin D deficiency, unspecified: Secondary | ICD-10-CM | POA: Diagnosis not present

## 2022-11-10 DIAGNOSIS — R739 Hyperglycemia, unspecified: Secondary | ICD-10-CM | POA: Diagnosis not present

## 2022-11-10 DIAGNOSIS — F4321 Adjustment disorder with depressed mood: Secondary | ICD-10-CM | POA: Insufficient documentation

## 2022-11-10 DIAGNOSIS — R69 Illness, unspecified: Secondary | ICD-10-CM | POA: Diagnosis not present

## 2022-11-10 DIAGNOSIS — F4320 Adjustment disorder, unspecified: Secondary | ICD-10-CM

## 2022-11-10 LAB — LIPID PANEL
Cholesterol: 147 mg/dL (ref 0–200)
HDL: 63 mg/dL (ref >39.00)
LDL Cholesterol: 50 mg/dL (ref 0–99)
NonHDL: 84.36
Total CHOL/HDL Ratio: 2
Triglycerides: 171 mg/dL — ABNORMAL HIGH (ref 0.0–149.0)
VLDL: 34.2 mg/dL (ref 0.0–40.0)

## 2022-11-10 LAB — CBC WITH DIFFERENTIAL/PLATELET
Basophils Absolute: 0.1 10*3/uL (ref 0.0–0.1)
Basophils Relative: 0.7 % (ref 0.0–3.0)
Eosinophils Absolute: 0.2 10*3/uL (ref 0.0–0.7)
Eosinophils Relative: 2.2 % (ref 0.0–5.0)
HCT: 41.2 % (ref 36.0–46.0)
Hemoglobin: 14 g/dL (ref 12.0–15.0)
Lymphocytes Relative: 32.2 % (ref 12.0–46.0)
Lymphs Abs: 2.8 10*3/uL (ref 0.7–4.0)
MCHC: 34.1 g/dL (ref 30.0–36.0)
MCV: 94.5 fl (ref 78.0–100.0)
Monocytes Absolute: 0.9 10*3/uL (ref 0.1–1.0)
Monocytes Relative: 9.9 % (ref 3.0–12.0)
Neutro Abs: 4.7 10*3/uL (ref 1.4–7.7)
Neutrophils Relative %: 55 % (ref 43.0–77.0)
Platelets: 328 10*3/uL (ref 150.0–400.0)
RBC: 4.36 Mil/uL (ref 3.87–5.11)
RDW: 13.3 % (ref 11.5–15.5)
WBC: 8.6 10*3/uL (ref 4.0–10.5)

## 2022-11-10 LAB — COMPREHENSIVE METABOLIC PANEL
ALT: 26 U/L (ref 0–35)
AST: 24 U/L (ref 0–37)
Albumin: 4.5 g/dL (ref 3.5–5.2)
Alkaline Phosphatase: 56 U/L (ref 39–117)
BUN: 23 mg/dL (ref 6–23)
CO2: 27 mEq/L (ref 19–32)
Calcium: 10.4 mg/dL (ref 8.4–10.5)
Chloride: 101 mEq/L (ref 96–112)
Creatinine, Ser: 0.85 mg/dL (ref 0.40–1.20)
GFR: 66.99 mL/min (ref >60.00)
Glucose, Bld: 88 mg/dL (ref 70–99)
Potassium: 4.9 mEq/L (ref 3.5–5.1)
Sodium: 136 mEq/L (ref 135–145)
Total Bilirubin: 0.7 mg/dL (ref 0.2–1.2)
Total Protein: 7.1 g/dL (ref 6.0–8.3)

## 2022-11-10 LAB — HEMOGLOBIN A1C: Hgb A1c MFr Bld: 5.4 % (ref 4.6–6.5)

## 2022-11-10 LAB — TSH: TSH: 4.98 u[IU]/mL (ref 0.35–5.50)

## 2022-11-10 LAB — VITAMIN D 25 HYDROXY (VIT D DEFICIENCY, FRACTURES): VITD: 24.55 ng/mL — ABNORMAL LOW (ref 30.00–100.00)

## 2022-11-10 NOTE — Progress Notes (Signed)
Subjective:   By signing my name below, I, Kellie Simmering, attest that this documentation has been prepared under the direction and in the presence of Mosie Lukes, MD., 11/10/2022.   Patient ID: Michelle Jacobs, female    DOB: November 25, 1945, 77 y.o.   MRN: [id number]  Chief Complaint  Patient presents with   Follow-up    Follow up   HPI Patient is in today for an office visit. She denies CP/palpitations/SOB/HA/congestion/ fever/chills/GI or GU symptoms.  Immunizations Patient is expecting a newborn grandchild in 06/2023 and is interested in receiving a tetanus immunization. She is also wondering if she needs to be tested for measles.  Supplements She has stopped taking multivitamins due to concern about regulations but continues taking fish oil 1000 mg capsules.   Past Medical History:  Diagnosis Date   Allergy    Annual physical exam 10/06/2015   GERD (gastroesophageal reflux disease)    mild   History of chicken pox    Hyperglycemia 05/20/2013   Hyperlipidemia    Hypertension    Medicare annual wellness visit, subsequent 12/02/2011   Personal history of colonic polyps 2009   Benign colon polyps   Preventative health care 12/02/2011   Verrucous skin lesion 11/04/2016    No past surgical history on file.  Family History  Problem Relation Age of Onset   Cancer Mother        lung   Hypertension Mother    Heart disease Father        CHF   Cancer Sister        uterine   Hypertension Sister    Cancer Brother        HPV associated oral cancer, lung cancer   Hyperlipidemia Brother    Hypertension Brother    Hearing loss Brother    Diabetes Maternal Aunt        older, type 2   Diabetes Maternal Uncle        type II   Heart disease Maternal Uncle        s/p CABG   Cancer Maternal Grandmother 42       breast   Heart disease Maternal Grandmother        MI, CHF   Obesity Maternal Grandmother    Breast cancer Maternal Grandmother    Kidney disease Maternal  Grandfather        kidney failure   Cancer Paternal Grandmother    COPD Paternal Grandfather    Cancer Cousin        oral HPV related cancer    Social History   Socioeconomic History   Marital status: Married    Spouse name: Not on file   Number of children: 2   Years of education: Not on file   Highest education level: Not on file  Occupational History    Employer: [employer]  Tobacco Use   Smoking status: Never   Smokeless tobacco: Never  Substance and Sexual Activity   Alcohol use: Yes    Alcohol/week: 10.0 standard drinks of alcohol    Types: 10 Glasses of wine per week   Drug use: No   Sexual activity: Not on file    Comment: lives with husband, no dietary restrictions  Other Topics Concern   Not on file  Social History Narrative   Caffeine use:  2-3 cups coffee daily   Regular exercise:  Walks daily         Social Determinants of Health  Financial Resource Strain: Low Risk  (02/12/2021)   Overall Financial Resource Strain (CARDIA)    Difficulty of Paying Living Expenses: Not hard at all  Food Insecurity: No Food Insecurity (02/12/2021)   Hunger Vital Sign    Worried About Running Out of Food in the Last Year: Never true    Ran Out of Food in the Last Year: Never true  Transportation Needs: No Transportation Needs (02/12/2021)   PRAPARE - Hydrologist (Medical): No    Lack of Transportation (Non-Medical): No  Physical Activity: Sufficiently Active (02/12/2021)   Exercise Vital Sign    Days of Exercise per Week: 5 days    Minutes of Exercise per Session: 60 min  Stress: No Stress Concern Present (02/12/2021)   [place]    Feeling of Stress : Not at all  Social Connections: Moderately Isolated (02/12/2021)   Social Connection and Isolation Panel [NHANES]    Frequency of Communication with Friends and Family: More than three times a week    Frequency of  Social Gatherings with Friends and Family: More than three times a week    Attends Religious Services: Never    Marine scientist or Organizations: No    Attends Archivist Meetings: Never    Marital Status: Married  Human resources officer Violence: Not At Risk (02/12/2021)   Humiliation, Afraid, Rape, and Kick questionnaire    Fear of Current or Ex-Partner: No    Emotionally Abused: No    Physically Abused: No    Sexually Abused: No    Outpatient Medications Prior to Visit  Medication Sig Dispense Refill   atorvastatin (LIPITOR) 10 MG tablet Take 1 tablet (10 mg total) by mouth daily. 90 tablet 1   cetirizine (ZYRTEC) 10 MG tablet Take 10 mg by mouth daily.     Cholecalciferol (VITAMIN D3) 125 MCG (5000 UT) CAPS Take by mouth. 1 cap 4 times a week.     COVID-19 mRNA vaccine 2023-2024 (COMIRNATY) syringe Inject into the muscle. 0.3 mL 0   denosumab (PROLIA) 60 MG/ML SOSY injection Inject at MD office. Pt will get at office.  (Appt is 10/20/22) 1 mL 0   levothyroxine (SYNTHROID) 137 MCG tablet Take 1 tablet (137 mcg total) by mouth daily before breakfast. 90 tablet 0   lisinopril (ZESTRIL) 10 MG tablet Take 1 tablet (10 mg total) by mouth daily. 90 tablet 1   meloxicam (MOBIC) 7.5 MG tablet Take 1 tablet (7.5 mg total) by mouth 2 (two) times daily as needed. 45 tablet 1   Multiple Vitamin (MULTIVITAMIN) tablet Take 1 tablet by mouth daily.     Omega-3 Fatty Acids (FISH OIL) 1000 MG CAPS Take by mouth.     RSV vaccine recomb adjuvanted (AREXVY) 120 MCG/0.5ML injection Inject into the muscle. 1 each 0   No facility-administered medications prior to visit.    Allergies  Allergen Reactions   Codeine Nausea Only   Iodine Rash   Mercurochrome [Merbromin] Rash   Sulfa Antibiotics Rash    Review of Systems  Constitutional:  Negative for chills and fever.  HENT:  Negative for congestion.   Respiratory:  Negative for shortness of breath.   Cardiovascular:  Negative for chest  pain and palpitations.  Gastrointestinal:  Negative for abdominal pain, blood in stool, constipation, diarrhea, nausea and vomiting.  Genitourinary:  Negative for dysuria, frequency, hematuria and urgency.  Skin:  Neurological:  Negative for headaches.       Objective:    Physical Exam Constitutional:      General: She is not in acute distress.    Appearance: Normal appearance. She is normal weight. She is not ill-appearing.  HENT:     Head: Normocephalic and atraumatic.     Right Ear: External ear normal.     Left Ear: External ear normal.     Nose: Nose normal.     Mouth/Throat:     Mouth: Mucous membranes are moist.     Pharynx: Oropharynx is clear.  Eyes:     General:        Right eye: No discharge.        Left eye: No discharge.     Extraocular Movements: Extraocular movements intact.     Conjunctiva/sclera: Conjunctivae normal.     Pupils: Pupils are equal, round, and reactive to light.  Cardiovascular:     Rate and Rhythm: Normal rate and regular rhythm.     Pulses: Normal pulses.     Heart sounds: Normal heart sounds. No murmur heard.    No gallop.  Pulmonary:     Effort: Pulmonary effort is normal. No respiratory distress.     Breath sounds: Normal breath sounds. No wheezing or rales.  Abdominal:     General: Bowel sounds are normal.     Palpations: Abdomen is soft.     Tenderness: There is no abdominal tenderness. There is no guarding.  Musculoskeletal:        General: Normal range of motion.     Cervical back: Normal range of motion.     Right lower leg: No edema.     Left lower leg: No edema.  Skin:    General: Skin is warm and dry.  Neurological:     Mental Status: She is alert and oriented to person, place, and time.  Psychiatric:        Mood and Affect: Mood normal.        Behavior: Behavior normal.        Judgment: Judgment normal.     BP 126/68 (BP Location: Right Arm, Patient Position: Sitting, Cuff Size: Normal)   Pulse 65   Temp  (!) 97.5 F (36.4 C) (Oral)   Resp 16   Ht '5\' 2"'$  (1.575 m)   Wt 166 lb 3.2 oz (75.4 kg)   SpO2 95%   BMI 30.40 kg/m  Wt Readings from Last 3 Encounters:  11/10/22 166 lb 3.2 oz (75.4 kg)  05/12/22 159 lb 12.8 oz (72.5 kg)  08/14/21 162 lb (73.5 kg)    Diabetic Foot Exam - Simple   No data filed    Lab Results  Component Value Date   WBC 7.6 05/12/2022   HGB 13.1 05/12/2022   HCT 37.9 05/12/2022   PLT 309.0 05/12/2022   GLUCOSE 87 05/12/2022   CHOL 130 05/12/2022   TRIG 89.0 05/12/2022   HDL 61.80 05/12/2022   LDLDIRECT 67.0 07/15/2021   LDLCALC 50 05/12/2022   ALT 35 05/12/2022   AST 31 05/12/2022   NA 138 05/12/2022   K 5.0 05/12/2022   CL 102 05/12/2022   CREATININE 0.88 05/12/2022   BUN 18 05/12/2022   CO2 27 05/12/2022   TSH 0.32 (L) 05/12/2022   HGBA1C 5.2 07/15/2021    Lab Results  Component Value Date   TSH 0.32 (L) 05/12/2022   Lab Results  Component Value Date   WBC 7.6  05/12/2022   HGB 13.1 05/12/2022   HCT 37.9 05/12/2022   MCV 93.9 05/12/2022   PLT 309.0 05/12/2022   Lab Results  Component Value Date   NA 138 05/12/2022   K 5.0 05/12/2022   CO2 27 05/12/2022   GLUCOSE 87 05/12/2022   BUN 18 05/12/2022   CREATININE 0.88 05/12/2022   BILITOT 0.4 05/12/2022   ALKPHOS 62 05/12/2022   AST 31 05/12/2022   ALT 35 05/12/2022   PROT 7.1 05/12/2022   ALBUMIN 4.5 05/12/2022   CALCIUM 10.4 05/12/2022   GFR 64.48 05/12/2022   Lab Results  Component Value Date   CHOL 130 05/12/2022   Lab Results  Component Value Date   HDL 61.80 05/12/2022   Lab Results  Component Value Date   LDLCALC 50 05/12/2022   Lab Results  Component Value Date   TRIG 89.0 05/12/2022   Lab Results  Component Value Date   CHOLHDL 2 05/12/2022   Lab Results  Component Value Date   HGBA1C 5.2 07/15/2021      Assessment & Plan:  Healthy Lifestyle: Encouraged 6-8 hours of sleep, heart healthy diet, 60-80 oz of non-alcohol/non-caffeinated fluids, and  minimum of 4000 steps daily.  Immunizations: Reviewed immunization history and encouraged Tdap at pharmacy.   Labs: Routine blood work ordered.  Refills: All current medications refilled for 90 day supply.   Supplements: Encouraged patient to continue taking fish oil and resume taking multivitamins. 1200-1500 mg calcium daily.  Problem List Items Addressed This Visit     Grief reaction    Recently lost a brother and cousin and is notably sad as a result. She should report if symptoms become debilitating and/or persistent      HTN (hypertension) - Primary    Well controlled, no changes to meds. Encouraged heart healthy diet such as the DASH diet and exercise as tolerated.        Relevant Orders   CBC with Differential/Platelet   Comprehensive metabolic panel   TSH   Hyperglycemia    hgba1c acceptable, minimize simple carbs. Increase exercise as tolerated.        Relevant Orders   Hemoglobin A1c   Hyperlipidemia    Encourage heart healthy diet such as MIND or DASH diet, increase exercise, avoid trans fats, simple carbohydrates and processed foods, consider a krill or fish or flaxseed oil cap daily.        Relevant Orders   Lipid panel   Hypothyroidism    On Levothyroxine, continue to monitor       Osteopenia    Encouraged to get adequate exercise, calcium and vitamin d intake       Vitamin D deficiency    Supplement and monitor      Relevant Orders   Vitamin D (25 hydroxy)   No orders of the defined types were placed in this encounter.  I, Penni Homans, MD, personally preformed the services described in this documentation.  All medical record entries made by the scribe were at my direction and in my presence.  I have reviewed the chart and discharge instructions (if applicable) and agree that the record reflects my personal performance and is accurate and complete. 11/10/2022  I,Mohammed Iqbal,acting as a scribe for Penni Homans, MD.,have documented all relevant  documentation on the behalf of Penni Homans, MD,as directed by  Penni Homans, MD while in the presence of Penni Homans, MD.  Penni Homans, MD

## 2022-11-10 NOTE — Patient Instructions (Addendum)
Tdap for tetanus  Consider NOW multivitamins and fish oil  Recommend calcium intake of 1200 to 1500 mg daily, divided into roughly 3 doses. Best source is the diet and a single dairy serving is about 500 mg, a supplement of calcium citrate once or twice daily to balance diet is fine if not getting enough in diet. Also need Vitamin D 2000 IU caps, 1 cap daily if not already taking vitamin D. Also recommend weight baring exercise on hips and upper body to keep bones strong

## 2022-11-10 NOTE — Assessment & Plan Note (Signed)
Recently lost a brother and cousin and is notably sad as a result. She should report if symptoms become debilitating and/or persistent

## 2022-11-11 ENCOUNTER — Other Ambulatory Visit: Payer: Self-pay

## 2022-11-11 DIAGNOSIS — I10 Essential (primary) hypertension: Secondary | ICD-10-CM

## 2022-11-11 MED ORDER — ONE-DAILY MULTI VITAMINS PO TABS: 1.0000 | ORAL_TABLET | Freq: Every day | ORAL | 3 refills | Status: DC

## 2022-11-11 MED ORDER — MELOXICAM 7.5 MG PO TABS: 7.5000 mg | ORAL_TABLET | Freq: Two times a day (BID) | ORAL | 1 refills | Status: DC | PRN

## 2022-11-11 MED ORDER — LISINOPRIL 10 MG PO TABS: 10.0000 mg | ORAL_TABLET | Freq: Every day | ORAL | 1 refills | Status: DC

## 2022-11-11 MED ORDER — VITAMIN D (ERGOCALCIFEROL) 1.25 MG (50000 UNIT) PO CAPS: 50000.0000 [IU] | ORAL_CAPSULE | ORAL | 4 refills | Status: DC

## 2022-11-11 MED ORDER — LEVOTHYROXINE SODIUM 137 MCG PO TABS: 137.0000 ug | ORAL_TABLET | Freq: Every day | ORAL | 0 refills | Status: DC

## 2022-11-25 ENCOUNTER — Other Ambulatory Visit: Payer: Self-pay

## 2022-11-25 ENCOUNTER — Telehealth: Payer: Self-pay | Admitting: Family Medicine

## 2022-11-25 NOTE — Telephone Encounter (Signed)
Called pt spoke her about medication,  Pt Stated she not taking multi -vitamin and not taking Meloxicam.

## 2022-11-25 NOTE — Telephone Encounter (Signed)
Pt called stating there were a couple of medications that she was unsure as to why she was taking them and would like a call back to go over this info.

## 2022-12-17 ENCOUNTER — Encounter: Payer: Self-pay | Admitting: *Deleted

## 2022-12-17 ENCOUNTER — Other Ambulatory Visit: Payer: Self-pay | Admitting: Family Medicine

## 2022-12-17 DIAGNOSIS — E782 Mixed hyperlipidemia: Secondary | ICD-10-CM

## 2023-03-23 ENCOUNTER — Other Ambulatory Visit: Payer: Self-pay | Admitting: Family Medicine

## 2023-03-23 ENCOUNTER — Other Ambulatory Visit (HOSPITAL_COMMUNITY): Payer: Self-pay

## 2023-03-23 MED ORDER — PROLIA 60 MG/ML ~~LOC~~ SOSY
PREFILLED_SYRINGE | SUBCUTANEOUS | 0 refills | Status: DC
  Filled 2023-03-23: qty 1, 180d supply, fill #0

## 2023-03-27 ENCOUNTER — Other Ambulatory Visit: Payer: Self-pay | Admitting: Family Medicine

## 2023-03-27 DIAGNOSIS — I10 Essential (primary) hypertension: Secondary | ICD-10-CM

## 2023-03-29 ENCOUNTER — Other Ambulatory Visit (HOSPITAL_COMMUNITY): Payer: Self-pay

## 2023-04-15 ENCOUNTER — Other Ambulatory Visit (HOSPITAL_COMMUNITY): Payer: Self-pay

## 2023-04-27 ENCOUNTER — Telehealth: Payer: Self-pay

## 2023-04-27 ENCOUNTER — Ambulatory Visit (INDEPENDENT_AMBULATORY_CARE_PROVIDER_SITE_OTHER): Payer: Medicare HMO

## 2023-04-27 DIAGNOSIS — M810 Age-related osteoporosis without current pathological fracture: Secondary | ICD-10-CM | POA: Diagnosis not present

## 2023-04-27 MED ORDER — DENOSUMAB 60 MG/ML ~~LOC~~ SOSY
60.0000 mg | PREFILLED_SYRINGE | Freq: Once | SUBCUTANEOUS | Status: AC
  Administered 2023-04-27: 60 mg via SUBCUTANEOUS

## 2023-04-27 NOTE — Progress Notes (Signed)
Michelle Jacobs is a 77 y.o. female presents to the office today for Prolia Injection, per physician's orders. Prolia 60 mg SQ , was administered L arm today. Patient tolerated injection. Patient next injection due: 6 months, appt made: No- will schedule in 5 months after benifits are ran again Initial injection: no Patient supplied: yes  Creft, Melton Alar L

## 2023-04-27 NOTE — Telephone Encounter (Signed)
Prolia administered today

## 2023-05-22 ENCOUNTER — Encounter (HOSPITAL_COMMUNITY): Payer: Self-pay

## 2023-05-25 ENCOUNTER — Other Ambulatory Visit (HOSPITAL_BASED_OUTPATIENT_CLINIC_OR_DEPARTMENT_OTHER): Payer: Self-pay

## 2023-05-25 MED ORDER — INFLUENZA VAC A&B SURF ANT ADJ 0.5 ML IM SUSY
0.5000 mL | PREFILLED_SYRINGE | Freq: Once | INTRAMUSCULAR | 0 refills | Status: AC
  Filled 2023-05-25: qty 0.5, 1d supply, fill #0

## 2023-06-14 ENCOUNTER — Other Ambulatory Visit: Payer: Self-pay | Admitting: Family Medicine

## 2023-06-14 DIAGNOSIS — E782 Mixed hyperlipidemia: Secondary | ICD-10-CM

## 2023-07-06 ENCOUNTER — Other Ambulatory Visit (HOSPITAL_BASED_OUTPATIENT_CLINIC_OR_DEPARTMENT_OTHER): Payer: Self-pay

## 2023-07-06 MED ORDER — COVID-19 MRNA VAC-TRIS(PFIZER) 30 MCG/0.3ML IM SUSY
0.3000 mL | PREFILLED_SYRINGE | Freq: Once | INTRAMUSCULAR | 0 refills | Status: AC
  Filled 2023-07-06: qty 0.3, 1d supply, fill #0

## 2023-07-27 ENCOUNTER — Encounter: Payer: Medicare HMO | Admitting: Family Medicine

## 2023-08-16 ENCOUNTER — Ambulatory Visit (INDEPENDENT_AMBULATORY_CARE_PROVIDER_SITE_OTHER): Payer: Medicare HMO | Admitting: Family Medicine

## 2023-08-16 ENCOUNTER — Encounter: Payer: Self-pay | Admitting: Family Medicine

## 2023-08-16 VITALS — BP 131/61 | HR 78 | Ht 62.0 in | Wt 167.0 lb

## 2023-08-16 DIAGNOSIS — I10 Essential (primary) hypertension: Secondary | ICD-10-CM | POA: Diagnosis not present

## 2023-08-16 DIAGNOSIS — R739 Hyperglycemia, unspecified: Secondary | ICD-10-CM | POA: Diagnosis not present

## 2023-08-16 DIAGNOSIS — E559 Vitamin D deficiency, unspecified: Secondary | ICD-10-CM | POA: Diagnosis not present

## 2023-08-16 DIAGNOSIS — E039 Hypothyroidism, unspecified: Secondary | ICD-10-CM | POA: Diagnosis not present

## 2023-08-16 DIAGNOSIS — Z0000 Encounter for general adult medical examination without abnormal findings: Secondary | ICD-10-CM | POA: Diagnosis not present

## 2023-08-16 LAB — CBC WITH DIFFERENTIAL/PLATELET
Basophils Absolute: 0.1 10*3/uL (ref 0.0–0.1)
Basophils Relative: 0.8 % (ref 0.0–3.0)
Eosinophils Absolute: 0.2 10*3/uL (ref 0.0–0.7)
Eosinophils Relative: 2.9 % (ref 0.0–5.0)
HCT: 39.5 % (ref 36.0–46.0)
Hemoglobin: 13.7 g/dL (ref 12.0–15.0)
Lymphocytes Relative: 33.3 % (ref 12.0–46.0)
Lymphs Abs: 2.3 10*3/uL (ref 0.7–4.0)
MCHC: 34.5 g/dL (ref 30.0–36.0)
MCV: 95.2 fL (ref 78.0–100.0)
Monocytes Absolute: 0.8 10*3/uL (ref 0.1–1.0)
Monocytes Relative: 11.3 % (ref 3.0–12.0)
Neutro Abs: 3.6 10*3/uL (ref 1.4–7.7)
Neutrophils Relative %: 51.7 % (ref 43.0–77.0)
Platelets: 341 10*3/uL (ref 150.0–400.0)
RBC: 4.15 Mil/uL (ref 3.87–5.11)
RDW: 12.6 % (ref 11.5–15.5)
WBC: 6.9 10*3/uL (ref 4.0–10.5)

## 2023-08-16 LAB — COMPREHENSIVE METABOLIC PANEL
ALT: 31 U/L (ref 0–35)
AST: 23 U/L (ref 0–37)
Albumin: 4.6 g/dL (ref 3.5–5.2)
Alkaline Phosphatase: 52 U/L (ref 39–117)
BUN: 17 mg/dL (ref 6–23)
CO2: 28 meq/L (ref 19–32)
Calcium: 9.7 mg/dL (ref 8.4–10.5)
Chloride: 102 meq/L (ref 96–112)
Creatinine, Ser: 0.85 mg/dL (ref 0.40–1.20)
GFR: 66.63 mL/min (ref >60.00)
Glucose, Bld: 98 mg/dL (ref 70–99)
Potassium: 5 meq/L (ref 3.5–5.1)
Sodium: 137 meq/L (ref 135–145)
Total Bilirubin: 0.7 mg/dL (ref 0.2–1.2)
Total Protein: 6.9 g/dL (ref 6.0–8.3)

## 2023-08-16 LAB — LIPID PANEL
Cholesterol: 145 mg/dL (ref 0–200)
HDL: 56.4 mg/dL (ref >39.00)
LDL Cholesterol: 58 mg/dL (ref 0–99)
NonHDL: 88.17
Total CHOL/HDL Ratio: 3
Triglycerides: 149 mg/dL (ref 0.0–149.0)
VLDL: 29.8 mg/dL (ref 0.0–40.0)

## 2023-08-16 LAB — HEMOGLOBIN A1C: Hgb A1c MFr Bld: 5.5 % (ref 4.6–6.5)

## 2023-08-16 LAB — VITAMIN D 25 HYDROXY (VIT D DEFICIENCY, FRACTURES): VITD: 29.96 ng/mL — ABNORMAL LOW (ref 30.00–100.00)

## 2023-08-16 LAB — TSH: TSH: 1.26 u[IU]/mL (ref 0.35–5.50)

## 2023-08-16 NOTE — Progress Notes (Signed)
Complete physical exam  Patient: Michelle Jacobs   DOB: 03/28/1946   77 y.o. Female  MRN: [id number]  Subjective:    Chief Complaint  Patient presents with   Annual Exam    Michelle Jacobs is a 77 y.o. female who presents today for a complete physical exam. She reports consuming a general diet. The patient does not participate in regular exercise at present. She generally feels well. She reports sleeping well. She does not have additional problems to discuss today.   Currently lives with: husband Acute concerns or interim problems since last visit: no  Vision concerns: no concerns, due for follow-up with eye doctor Dental concerns: no concerns, regular dental care   ETOH use: rare Nicotine use: no Recreational drugs/illegal substances: no        Most recent fall risk assessment:    08/16/2023    9:55 AM  Fall Risk   Falls in the past year? 0  Number falls in past yr: 0  Injury with Fall? 0  Risk for fall due to : No Fall Risks  Follow up Falls evaluation completed     Most recent depression screenings:    08/16/2023    9:55 AM 11/10/2022   11:13 AM  PHQ 2/9 Scores  PHQ - 2 Score 0 0  PHQ- 9 Score  0         Patient Care Team: Bradd Canary, MD as PCP - General (Family Medicine)   Outpatient Medications Prior to Visit  Medication Sig   atorvastatin (LIPITOR) 10 MG tablet TAKE 1 TABLET DAILY   Cholecalciferol (VITAMIN D3) 125 MCG (5000 UT) CAPS Take by mouth. 1 cap 4 times a week.   denosumab (PROLIA) 60 MG/ML SOSY injection Inject at MD office.  Pt appt is 04/27/23   levocetirizine (XYZAL) 5 MG tablet Take 5 mg by mouth every evening.   levothyroxine (SYNTHROID) 137 MCG tablet TAKE 1 TABLET DAILY BEFORE BREAKFAST (DOSE DECREASED  TO 137 MCG 05/12/22)   lisinopril (ZESTRIL) 10 MG tablet TAKE 1 TABLET DAILY   Omega-3 Fatty Acids (FISH OIL) 1000 MG CAPS Take by mouth.   Vitamin D, Ergocalciferol, (DRISDOL) 1.25 MG (50000 UNIT) CAPS capsule  Take 1 capsule (50,000 Units total) by mouth every 7 (seven) days.   [DISCONTINUED] cetirizine (ZYRTEC) 10 MG tablet Take 10 mg by mouth daily.   [DISCONTINUED] COVID-19 mRNA vaccine 2023-2024 (COMIRNATY) syringe Inject into the muscle.   [DISCONTINUED] RSV vaccine recomb adjuvanted (AREXVY) 120 MCG/0.5ML injection Inject into the muscle.   No facility-administered medications prior to visit.    ROS All review of systems negative except what is listed in the HPI        Objective:     BP 131/61   Pulse 78   Ht 5\' 2"  (1.575 m)   Wt 167 lb (75.8 kg)   SpO2 97%   BMI 30.54 kg/m    Physical Exam Vitals reviewed.  Constitutional:      General: She is not in acute distress.    Appearance: Normal appearance. She is not ill-appearing.  HENT:     Head: Normocephalic and atraumatic.     Right Ear: Tympanic membrane normal.     Left Ear: Tympanic membrane normal.     Nose: Nose normal.     Mouth/Throat:     Mouth: Mucous membranes are moist.     Pharynx: Oropharynx is clear.  Eyes:     Extraocular Movements: Extraocular movements intact.  Conjunctiva/sclera: Conjunctivae normal.     Pupils: Pupils are equal, round, and reactive to light.  Cardiovascular:     Rate and Rhythm: Normal rate and regular rhythm.     Pulses: Normal pulses.     Heart sounds: Normal heart sounds.  Pulmonary:     Effort: Pulmonary effort is normal.     Breath sounds: Normal breath sounds.  Abdominal:     General: Abdomen is flat. Bowel sounds are normal. There is no distension.     Palpations: Abdomen is soft. There is no mass.     Tenderness: There is no abdominal tenderness. There is no right CVA tenderness, left CVA tenderness, guarding or rebound.  Genitourinary:    Comments: Deferred exam Musculoskeletal:        General: Normal range of motion.     Cervical back: Normal range of motion and neck supple. No tenderness.     Right lower leg: No edema.     Left lower leg: No edema.   Lymphadenopathy:     Cervical: No cervical adenopathy.  Skin:    General: Skin is warm and dry.     Capillary Refill: Capillary refill takes less than 2 seconds.  Neurological:     General: No focal deficit present.     Mental Status: She is alert and oriented to person, place, and time. Mental status is at baseline.  Psychiatric:        Mood and Affect: Mood normal.        Behavior: Behavior normal.        Thought Content: Thought content normal.        Judgment: Judgment normal.      No results found for any visits on 08/16/23.     Assessment & Plan:    Routine Health Maintenance and Physical Exam Discussed health promotion and safety including diet and exercise recommendations, dental health, and injury prevention. Tobacco cessation if applicable. Seat belts, sunscreen, smoke detectors, etc.    Immunization History  Administered Date(s) Administered   Fluad Quad(high Dose 65+) 05/24/2019, 05/20/2020, 07/15/2021, 05/12/2022   Fluad Trivalent(High Dose 65+) 05/25/2023   Influenza Split 05/30/2012   Influenza, High Dose Seasonal PF 07/20/2014, 09/25/2016, 05/25/2017, 06/03/2018   Influenza,inj,Quad PF,6+ Mos 05/15/2013, 09/23/2015   PFIZER Comirnaty(Gray Top)Covid-19 Tri-Sucrose Vaccine 12/20/2020   PFIZER(Purple Top)SARS-COV-2 Vaccination 10/09/2019, 11/03/2019, 05/28/2020   Pfizer Covid-19 Vaccine Bivalent Booster 78yrs & up 06/20/2021   Pfizer(Comirnaty)Fall Seasonal Vaccine 12 years and older 06/23/2022, 07/06/2023   Pneumococcal Conjugate-13 07/30/2014   Pneumococcal Polysaccharide-23 09/23/2015   Respiratory Syncytial Virus Vaccine,Recomb Aduvanted(Arexvy) 09/03/2022   Tdap 09/03/2011   Zoster Recombinant(Shingrix) 05/24/2019, 08/01/2019   Zoster, Live 03/28/2013    Health Maintenance  Topic Date Due   Medicare Annual Wellness (AWV)  02/12/2022   DTaP/Tdap/Td (2 - Td or Tdap) 08/15/2024 (Originally 09/02/2021)   Pneumonia Vaccine 67+ Years old  Completed    INFLUENZA VACCINE  Completed   DEXA SCAN  Completed   COVID-19 Vaccine  Completed   Hepatitis C Screening  Completed   Zoster Vaccines- Shingrix  Completed   HPV VACCINES  Aged Out   Colonoscopy  Discontinued        Problem List Items Addressed This Visit       Active Problems   HTN (hypertension)   Relevant Orders   Comprehensive metabolic panel   Hypothyroidism   Relevant Orders   TSH   Hyperglycemia   Relevant Orders   Hemoglobin A1c   Annual physical  exam - Primary   Relevant Orders   Comprehensive metabolic panel   TSH   VITAMIN D 25 Hydroxy (Vit-D Deficiency, Fractures)   CBC with Differential/Platelet   Hemoglobin A1c   Lipid panel   Vitamin D deficiency   Relevant Orders   VITAMIN D 25 Hydroxy (Vit-D Deficiency, Fractures)   Return in about 6 months (around 02/14/2024) for routine follow-up.     Clayborne Dana, NP

## 2023-09-23 ENCOUNTER — Other Ambulatory Visit: Payer: Self-pay | Admitting: Family Medicine

## 2023-09-23 ENCOUNTER — Other Ambulatory Visit: Payer: Self-pay | Admitting: *Deleted

## 2023-09-23 DIAGNOSIS — M810 Age-related osteoporosis without current pathological fracture: Secondary | ICD-10-CM

## 2023-09-23 DIAGNOSIS — I10 Essential (primary) hypertension: Secondary | ICD-10-CM

## 2023-09-23 MED ORDER — DENOSUMAB 60 MG/ML ~~LOC~~ SOSY
60.0000 mg | PREFILLED_SYRINGE | Freq: Once | SUBCUTANEOUS | Status: AC
  Administered 2023-10-28: 60 mg via SUBCUTANEOUS

## 2023-09-27 NOTE — Telephone Encounter (Signed)
Prolia was done 04/27/23 Next is due 10/28/23 CAM placed. Please check PA/Cost.

## 2023-09-30 ENCOUNTER — Other Ambulatory Visit (HOSPITAL_COMMUNITY): Payer: Self-pay

## 2023-10-01 ENCOUNTER — Other Ambulatory Visit: Payer: Self-pay | Admitting: Family Medicine

## 2023-10-01 ENCOUNTER — Other Ambulatory Visit: Payer: Self-pay | Admitting: Pharmacy Technician

## 2023-10-01 ENCOUNTER — Other Ambulatory Visit (HOSPITAL_COMMUNITY): Payer: Self-pay

## 2023-10-01 ENCOUNTER — Telehealth: Payer: Self-pay

## 2023-10-01 ENCOUNTER — Other Ambulatory Visit: Payer: Self-pay

## 2023-10-01 MED ORDER — PROLIA 60 MG/ML ~~LOC~~ SOSY
60.0000 mg | PREFILLED_SYRINGE | SUBCUTANEOUS | 0 refills | Status: AC
  Filled 2023-10-01: qty 1, 180d supply, fill #0

## 2023-10-01 NOTE — Telephone Encounter (Signed)
Prolia VOB initiated via [email or web address]  Next Prolia inj DUE: 10/25/23

## 2023-10-01 NOTE — Progress Notes (Signed)
Specialty Pharmacy Refill Coordination Note  Michelle Jacobs is a 78 y.o. female contacted today regarding refills of specialty medication(s) Denosumab (PROLIA)   Patient requested Courier to Provider Office   Delivery date: 10/21/23   Verified address: [facility] [address]  [address]   Medication will be filled on 10/20/23.  RR sent to MD

## 2023-10-04 NOTE — Telephone Encounter (Signed)
 Prolia BIV in separate encounter.

## 2023-10-04 NOTE — Telephone Encounter (Signed)
 Marland Kitchen

## 2023-10-04 NOTE — Telephone Encounter (Signed)
Pharmacy Patient Advocate Encounter   Received notification from  [facility] Portal that prior authorization for PROLIA is required/requested.   Insurance verification completed.   The patient is insured through [employer] .   Per test claim: PA required; PA submitted to above mentioned insurance via [employer]/confirmation #/EOC [id number] Status is pending

## 2023-10-05 DIAGNOSIS — H524 Presbyopia: Secondary | ICD-10-CM | POA: Diagnosis not present

## 2023-10-05 DIAGNOSIS — H52223 Regular astigmatism, bilateral: Secondary | ICD-10-CM | POA: Diagnosis not present

## 2023-10-05 DIAGNOSIS — H5203 Hypermetropia, bilateral: Secondary | ICD-10-CM | POA: Diagnosis not present

## 2023-10-05 NOTE — Telephone Encounter (Signed)
 Pharmacy had called pt for refill on 10/01/23 and advised her to contact office.  She called and I advised her that we were awaiting on us  to run her benefits.  She got upset and wanted us  to send in med to pharmacy and make an appointment.  Appointment was made and rx sent into pharmacy.  Advised pt that we are unable to give her a cost and the cost may be different than the last time.

## 2023-10-08 NOTE — Telephone Encounter (Signed)
 Pharmacy Patient Advocate Encounter  Received notification from [employer] that Prior Authorization for Prolia  has been APPROVED from 10/15/23 to 10/14/24   PA #/Case ID/Reference #: [id number]  Approval letter indexed to media tab

## 2023-10-11 ENCOUNTER — Other Ambulatory Visit (HOSPITAL_COMMUNITY): Payer: Self-pay

## 2023-10-12 ENCOUNTER — Other Ambulatory Visit: Payer: Self-pay

## 2023-10-14 ENCOUNTER — Other Ambulatory Visit (HOSPITAL_COMMUNITY): Payer: Self-pay

## 2023-10-14 NOTE — Telephone Encounter (Signed)
Pt ready for scheduling for PROLIA on or after : 10/25/23  Out-of-pocket cost due at time of visit: $357  Number of injection/visits approved: 2  Primary: [employer] Prolia co-insurance: 20% Admin fee co-insurance: 20%  Secondary: --- Prolia co-insurance:  Admin fee co-insurance:   Medical Benefit Details: Date Benefits were checked: 10/01/23 Deductible: NO/ Coinsurance: 20%/ Admin Fee: 20%  Prior Auth: APPROVED PA# [id number]  Expiration Date: 10/15/23-10/14/24  # of doses approved: 2  Pharmacy benefit: Copay $645.79 If patient wants fill through the pharmacy benefit please send prescription to: [employer], and include estimated need by date in rx notes. Pharmacy will ship medication directly to the office.  Patient NOT eligible for Prolia Copay Card. Copay Card can make patient's cost as little as $25. Link to apply: [email or web address]://www.amgensupportplus.[email or web address]/copay  ** This summary of benefits is an estimation of the patient's out-of-pocket cost. Exact cost may very based on individual plan coverage.

## 2023-10-15 ENCOUNTER — Other Ambulatory Visit (HOSPITAL_COMMUNITY): Payer: Self-pay

## 2023-10-15 NOTE — Telephone Encounter (Addendum)
   Patient's pharmacy copay is more expensive than her estimated buy and bill (practice supplied) costs. Is patient aware?

## 2023-10-19 ENCOUNTER — Other Ambulatory Visit: Payer: Self-pay

## 2023-10-19 NOTE — Telephone Encounter (Signed)
 Patient will get from office for $357.  Pt has been scheduled already.

## 2023-10-20 ENCOUNTER — Other Ambulatory Visit: Payer: Self-pay

## 2023-10-20 NOTE — Progress Notes (Signed)
 Specialty medication Prolia is more affordable via the buy and bill option rather than through patients pharmacy benefit, patient will be disenrolled from program

## 2023-10-28 ENCOUNTER — Ambulatory Visit (INDEPENDENT_AMBULATORY_CARE_PROVIDER_SITE_OTHER): Payer: Medicare HMO

## 2023-10-28 DIAGNOSIS — M810 Age-related osteoporosis without current pathological fracture: Secondary | ICD-10-CM | POA: Diagnosis not present

## 2023-10-28 MED ORDER — DENOSUMAB 60 MG/ML ~~LOC~~ SOSY
60.0000 mg | PREFILLED_SYRINGE | SUBCUTANEOUS | Status: AC
  Administered 2024-04-27: 60 mg via SUBCUTANEOUS

## 2023-10-28 NOTE — Progress Notes (Signed)
 Michelle Jacobs is a 78 y.o. female presents to the office today for Prolia injection, per physician's orders. Prolia 60 mg SQ , was administered L arm today. Patient tolerated injection. Patient next injection due: Next is due 04/26/24 appt made: No- will schedule in 5 months after benifits are ran again Initial injection: no Patient supplied: no  CAM placed for next injection.   Creft, Feliberto Harts

## 2023-11-17 ENCOUNTER — Other Ambulatory Visit: Payer: Self-pay | Admitting: Family Medicine

## 2023-11-17 DIAGNOSIS — E782 Mixed hyperlipidemia: Secondary | ICD-10-CM

## 2024-02-20 NOTE — Assessment & Plan Note (Signed)
 Encourage heart healthy diet such as MIND or DASH diet, increase exercise, avoid trans fats, simple carbohydrates and processed foods, consider a krill or fish or flaxseed oil cap daily.

## 2024-02-20 NOTE — Assessment & Plan Note (Signed)
 On Levothyroxine, continue to monitor

## 2024-02-20 NOTE — Assessment & Plan Note (Signed)
 Well controlled, no changes to meds. Encouraged heart healthy diet such as the DASH diet and exercise as tolerated.

## 2024-02-20 NOTE — Assessment & Plan Note (Signed)
 hgba1c acceptable, minimize simple carbs. Increase exercise as tolerated.

## 2024-02-20 NOTE — Assessment & Plan Note (Signed)
 Supplement and monitor

## 2024-02-24 ENCOUNTER — Encounter: Payer: Self-pay | Admitting: Family Medicine

## 2024-02-24 ENCOUNTER — Ambulatory Visit (INDEPENDENT_AMBULATORY_CARE_PROVIDER_SITE_OTHER): Payer: Medicare HMO | Admitting: Family Medicine

## 2024-02-24 VITALS — BP 132/82 | HR 62 | Resp 16 | Ht 62.0 in | Wt 162.8 lb

## 2024-02-24 DIAGNOSIS — E559 Vitamin D deficiency, unspecified: Secondary | ICD-10-CM | POA: Diagnosis not present

## 2024-02-24 DIAGNOSIS — I10 Essential (primary) hypertension: Secondary | ICD-10-CM

## 2024-02-24 DIAGNOSIS — R739 Hyperglycemia, unspecified: Secondary | ICD-10-CM

## 2024-02-24 DIAGNOSIS — E782 Mixed hyperlipidemia: Secondary | ICD-10-CM

## 2024-02-24 DIAGNOSIS — E039 Hypothyroidism, unspecified: Secondary | ICD-10-CM

## 2024-02-24 NOTE — Progress Notes (Signed)
 Subjective:    Patient ID: Michelle Jacobs, female    DOB: [birth year], 78 y.o.   MRN: [id number]  Chief Complaint  Patient presents with   Medical Management of Chronic Issues    Patient presents today for a 6 month follow-up.   Quality Metric Gaps    AWV    HPI Discussed the use of AI scribe software for clinical note transcription with the patient, who gave verbal consent to proceed.  History of Present Illness Michelle Jacobs is a 78 year old female who presents for routine follow-up and blood work review.  She has been taking 5000 IU of vitamin D  daily for years, but her levels were slightly low at 29 ng/mL in December, just below the normal range of 30 ng/mL.  She experiences stiffness in her right fourth finger, which began in the winter. The stiffness has improved somewhat with warmer weather. She has resumed crocheting and using stress balls to keep her hands active, which she believes helps. She experiences stiffness and locking, particularly in the morning, which improves as the day progresses.  She takes a multivitamin that is high in B vitamins and limits vitamin A content. She also takes a lot of B12 within the multivitamin.  No recent emergency room visits, no abdominal issues, and no new respiratory problems. 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 She has a grandson who is twelve and very active in sports, and she enjoys attending his events. She also has grandchildren in [place], whom she visits when possible. She recently returned from a family beach trip and plans another in August. She enjoys crocheting and has a history of being a Runner, broadcasting/film/video.    Past Medical History:  Diagnosis Date   Allergy    Annual physical exam 10/06/2015   GERD (gastroesophageal reflux disease)    mild   History of chicken pox    Hyperglycemia 05/20/2013   Hyperlipidemia    Hypertension     Medicare annual wellness visit, subsequent 12/02/2011   Personal history of colonic polyps 2009   Benign colon polyps   Preventative health care 12/02/2011   Verrucous skin lesion 11/04/2016    History reviewed. No pertinent surgical history.  Family History  Problem Relation Age of Onset   Cancer Mother        lung   Hypertension Mother    Heart disease Father        CHF   Cancer Sister        uterine   Hypertension Sister    Cancer Brother        HPV associated oral cancer, lung cancer   Hyperlipidemia Brother    Hypertension Brother    Hearing loss Brother    Diabetes Maternal Aunt        older, type 2   Diabetes Maternal Uncle        type II   Heart disease Maternal Uncle        s/p CABG   Cancer Maternal Grandmother 42       breast   Heart disease Maternal Grandmother        MI, CHF   Obesity Maternal Grandmother    Breast cancer Maternal Grandmother    Kidney disease Maternal Grandfather        kidney failure   Cancer Paternal Grandmother    COPD Paternal Grandfather    Cancer Cousin  oral HPV related cancer    Social History   Socioeconomic History   Marital status: Married    Spouse name: Not on file   Number of children: 2   Years of education: Not on file   Highest education level: Not on file  Occupational History    Employer: [employer]  Tobacco Use   Smoking status: Never   Smokeless tobacco: Never  Substance and Sexual Activity   Alcohol use: Yes    Alcohol/week: 10.0 standard drinks of alcohol    Types: 10 Glasses of wine per week   Drug use: No   Sexual activity: Not on file    Comment: lives with husband, no dietary restrictions  Other Topics Concern   Not on file  Social History Narrative   Caffeine use:  2-3 cups coffee daily   Regular exercise:  Walks daily         Social Drivers of Health   Financial Resource Strain: Low Risk  (02/12/2021)   Overall Financial Resource Strain (CARDIA)    Difficulty of Paying  Living Expenses: Not hard at all  Food Insecurity: No Food Insecurity (02/12/2021)   Hunger Vital Sign    Worried About Running Out of Food in the Last Year: Never true    Ran Out of Food in the Last Year: Never true  Transportation Needs: No Transportation Needs (02/12/2021)   PRAPARE - Administrator, Civil Service (Medical): No    Lack of Transportation (Non-Medical): No  Physical Activity: Sufficiently Active (02/12/2021)   Exercise Vital Sign    Days of Exercise per Week: 5 days    Minutes of Exercise per Session: 60 min  Stress: No Stress Concern Present (02/12/2021)   [employer] of Occupational Health - Occupational Stress Questionnaire    Feeling of Stress : Not at all  Social Connections: Moderately Isolated (02/12/2021)   Social Connection and Isolation Panel    Frequency of Communication with Friends and Family: More than three times a week    Frequency of Social Gatherings with Friends and Family: More than three times a week    Attends Religious Services: Never    Database administrator or Organizations: No    Attends Banker Meetings: Never    Marital Status: Married  Catering manager Violence: Not At Risk (02/12/2021)   Humiliation, Afraid, Rape, and Kick questionnaire    Fear of Current or Ex-Partner: No    Emotionally Abused: No    Physically Abused: No    Sexually Abused: No    Outpatient Medications Prior to Visit  Medication Sig Dispense Refill   atorvastatin  (LIPITOR) 10 MG tablet TAKE 1 TABLET DAILY 90 tablet 1   Cholecalciferol (VITAMIN D3) 125 MCG (5000 UT) CAPS Take by mouth. 1 cap 4 times a week.     denosumab  (PROLIA ) 60 MG/ML SOSY injection Inject 60 mg into the skin every 6 (six) months. Inject at MD office.  Pt appt is 10/28/23. Dx code: M81.0 1 mL 0   levocetirizine (XYZAL) 5 MG tablet Take 5 mg by mouth every evening.     levothyroxine  (SYNTHROID ) 137 MCG tablet TAKE 1 TABLET DAILY BEFORE BREAKFAST (DOSE DECREASED  TO  137MCG 05/12/22) 90 tablet 1   lisinopril  (ZESTRIL ) 10 MG tablet TAKE 1 TABLET DAILY 90 tablet 1   Omega-3 Fatty Acids (FISH OIL) 1000 MG CAPS Take by mouth.     Vitamin D , Ergocalciferol , (DRISDOL ) 1.25 MG (50000 UNIT) CAPS  capsule Take 1 capsule (50,000 Units total) by mouth every 7 (seven) days. 12 capsule 4   Facility-Administered Medications Prior to Visit  Medication Dose Route Frequency Provider Last Rate Last Admin   [START ON 03/26/2024] denosumab  (PROLIA ) injection 60 mg  60 mg Subcutaneous Q6 months Domenica Harlene LABOR, MD        Allergies  Allergen Reactions   Codeine Nausea Only   Iodine Rash   Mercurochrome [Merbromin] Rash   Sulfa Antibiotics Rash    Review of Systems  Constitutional:  Negative for fever and malaise/fatigue.  HENT:  Negative for congestion.   Eyes:  Negative for blurred vision.  Respiratory:  Negative for shortness of breath.   Cardiovascular:  Negative for chest pain, palpitations and leg swelling.  Gastrointestinal:  Negative for abdominal pain, blood in stool and nausea.  Genitourinary:  Negative for dysuria and frequency.  Musculoskeletal:  Negative for falls.  Skin:  Negative for rash.  Neurological:  Negative for dizziness, loss of consciousness and headaches.  Endo/Heme/Allergies:  Negative for environmental allergies.  Psychiatric/Behavioral:  Negative for depression. The patient is not nervous/anxious.        Objective:    Physical Exam Constitutional:      General: She is not in acute distress.    Appearance: Normal appearance. She is well-developed. She is not toxic-appearing.  HENT:     Head: Normocephalic and atraumatic.     Right Ear: External ear normal.     Left Ear: External ear normal.     Nose: Nose normal.   Eyes:     General:        Right eye: No discharge.        Left eye: No discharge.     Conjunctiva/sclera: Conjunctivae normal.   Neck:     Thyroid : No thyromegaly.   Cardiovascular:     Rate and Rhythm: Normal  rate and regular rhythm.     Heart sounds: Normal heart sounds. No murmur heard. Pulmonary:     Effort: Pulmonary effort is normal. No respiratory distress.     Breath sounds: Normal breath sounds.  Abdominal:     General: Bowel sounds are normal.     Palpations: Abdomen is soft.     Tenderness: There is no abdominal tenderness. There is no guarding.   Musculoskeletal:        General: Normal range of motion.     Cervical back: Neck supple.  Lymphadenopathy:     Cervical: No cervical adenopathy.   Skin:    General: Skin is warm and dry.   Neurological:     Mental Status: She is alert and oriented to person, place, and time.   Psychiatric:        Mood and Affect: Mood normal.        Behavior: Behavior normal.        Thought Content: Thought content normal.        Judgment: Judgment normal.     BP 132/82   Pulse 62   Resp 16   Ht 5' 2 (1.575 m)   Wt 162 lb 12.8 oz (73.8 kg)   SpO2 96%   BMI 29.78 kg/m  Wt Readings from Last 3 Encounters:  02/24/24 162 lb 12.8 oz (73.8 kg)  08/16/23 167 lb (75.8 kg)  11/10/22 166 lb 3.2 oz (75.4 kg)    Diabetic Foot Exam - Simple   No data filed    Lab Results  Component Value Date   WBC  6.9 08/16/2023   HGB 13.7 08/16/2023   HCT 39.5 08/16/2023   PLT 341.0 08/16/2023   GLUCOSE 98 08/16/2023   CHOL 145 08/16/2023   TRIG 149.0 08/16/2023   HDL 56.40 08/16/2023   LDLDIRECT 67.0 07/15/2021   LDLCALC 58 08/16/2023   ALT 31 08/16/2023   AST 23 08/16/2023   NA 137 08/16/2023   K 5.0 08/16/2023   CL 102 08/16/2023   CREATININE 0.85 08/16/2023   BUN 17 08/16/2023   CO2 28 08/16/2023   TSH 1.26 08/16/2023   HGBA1C 5.5 08/16/2023    Lab Results  Component Value Date   TSH 1.26 08/16/2023   Lab Results  Component Value Date   WBC 6.9 08/16/2023   HGB 13.7 08/16/2023   HCT 39.5 08/16/2023   MCV 95.2 08/16/2023   PLT 341.0 08/16/2023   Lab Results  Component Value Date   NA 137 08/16/2023   K 5.0 08/16/2023    CO2 28 08/16/2023   GLUCOSE 98 08/16/2023   BUN 17 08/16/2023   CREATININE 0.85 08/16/2023   BILITOT 0.7 08/16/2023   ALKPHOS 52 08/16/2023   AST 23 08/16/2023   ALT 31 08/16/2023   PROT 6.9 08/16/2023   ALBUMIN 4.6 08/16/2023   CALCIUM  9.7 08/16/2023   GFR 66.63 08/16/2023   Lab Results  Component Value Date   CHOL 145 08/16/2023   Lab Results  Component Value Date   HDL 56.40 08/16/2023   Lab Results  Component Value Date   LDLCALC 58 08/16/2023   Lab Results  Component Value Date   TRIG 149.0 08/16/2023   Lab Results  Component Value Date   CHOLHDL 3 08/16/2023   Lab Results  Component Value Date   HGBA1C 5.5 08/16/2023       Assessment & Plan:  Vitamin D  deficiency Assessment & Plan: Supplement and monitor  Orders: -     VITAMIN D  25 Hydroxy (Vit-D Deficiency, Fractures); Future  Mixed hyperlipidemia Assessment & Plan: Encourage heart healthy diet such as MIND or DASH diet, increase exercise, avoid trans fats, simple carbohydrates and processed foods, consider a krill or fish or flaxseed oil cap daily.    Orders: -     Lipid panel; Future  Hyperglycemia Assessment & Plan: hgba1c acceptable, minimize simple carbs. Increase exercise as tolerated.    Orders: -     Hemoglobin A1c; Future  Primary hypertension Assessment & Plan: Well controlled, no changes to meds. Encouraged heart healthy diet such as the DASH diet and exercise as tolerated.    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 - Encouraged continued use of the hand through activities like crocheting to maintain  mobility. - Considered adding omega-3 fatty acids (fish oil) to the diet to help manage symptoms. - Ensure a balanced diet and continue taking a multivitamin with minerals.  Vitamin D  deficiency Vitamin D  levels slightly low at 29 ng/mL. Discussed potential reasons for deficiency and importance of maintaining adequate levels. - Continue taking 5000 IU of vitamin D  daily. - Order lab work to monitor vitamin D  levels ahead of the next visit.  General Health Maintenance Emphasized importance of maintaining adequate levels of vitamins and minerals to support overall health. - Continue taking a multivitamin with minerals. - Order lab work to monitor overall health ahead of the next visit.     Harlene Horton, MD

## 2024-02-24 NOTE — Patient Instructions (Signed)
 Hypertension, Adult High blood pressure (hypertension) is when the force of blood pumping through the arteries is too strong. The arteries are the blood vessels that carry blood from the heart throughout the body. Hypertension forces the heart to work harder to pump blood and may cause arteries to become narrow or stiff. Untreated or uncontrolled hypertension can lead to a heart attack, heart failure, a stroke, kidney disease, and other problems. A blood pressure reading consists of a higher number over a lower number. Ideally, your blood pressure should be below 120/80. The first ("top") number is called the systolic pressure. It is a measure of the pressure in your arteries as your heart beats. The second ("bottom") number is called the diastolic pressure. It is a measure of the pressure in your arteries as the heart relaxes. What are the causes? The exact cause of this condition is not known. There are some conditions that result in high blood pressure. What increases the risk? Certain factors may make you more likely to develop high blood pressure. Some of these risk factors are under your control, including: Smoking. Not getting enough exercise or physical activity. Being overweight. Having too much fat, sugar, calories, or salt (sodium) in your diet. Drinking too much alcohol. Other risk factors include: Having a personal history of heart disease, diabetes, high cholesterol, or kidney disease. Stress. Having a family history of high blood pressure and high cholesterol. Having obstructive sleep apnea. Age. The risk increases with age. What are the signs or symptoms? High blood pressure may not cause symptoms. Very high blood pressure (hypertensive crisis) may cause: Headache. Fast or irregular heartbeats (palpitations). Shortness of breath. Nosebleed. Nausea and vomiting. Vision changes. Severe chest pain, dizziness, and seizures. How is this diagnosed? This condition is diagnosed by  measuring your blood pressure while you are seated, with your arm resting on a flat surface, your legs uncrossed, and your feet flat on the floor. The cuff of the blood pressure monitor will be placed directly against the skin of your upper arm at the level of your heart. Blood pressure should be measured at least twice using the same arm. Certain conditions can cause a difference in blood pressure between your right and left arms. If you have a high blood pressure reading during one visit or you have normal blood pressure with other risk factors, you may be asked to: Return on a different day to have your blood pressure checked again. Monitor your blood pressure at home for 1 week or longer. If you are diagnosed with hypertension, you may have other blood or imaging tests to help your health care provider understand your overall risk for other conditions. How is this treated? This condition is treated by making healthy lifestyle changes, such as eating healthy foods, exercising more, and reducing your alcohol intake. You may be referred for counseling on a healthy diet and physical activity. Your health care provider may prescribe medicine if lifestyle changes are not enough to get your blood pressure under control and if: Your systolic blood pressure is above 130. Your diastolic blood pressure is above 80. Your personal target blood pressure may vary depending on your medical conditions, your age, and other factors. Follow these instructions at home: Eating and drinking  Eat a diet that is high in fiber and potassium, and low in sodium, added sugar, and fat. An example of this eating plan is called the DASH diet. DASH stands for Dietary Approaches to Stop Hypertension. To eat this way: Eat  plenty of fresh fruits and vegetables. Try to fill one half of your plate at each meal with fruits and vegetables. Eat whole grains, such as whole-wheat pasta, brown rice, or whole-grain bread. Fill about one  fourth of your plate with whole grains. Eat or drink low-fat dairy products, such as skim milk or low-fat yogurt. Avoid fatty cuts of meat, processed or cured meats, and poultry with skin. Fill about one fourth of your plate with lean proteins, such as fish, chicken without skin, beans, eggs, or tofu. Avoid pre-made and processed foods. These tend to be higher in sodium, added sugar, and fat. Reduce your daily sodium intake. Many people with hypertension should eat less than 1,500 mg of sodium a day. Do not drink alcohol if: Your health care provider tells you not to drink. You are pregnant, may be pregnant, or are planning to become pregnant. If you drink alcohol: Limit how much you have to: 0-1 drink a day for women. 0-2 drinks a day for men. Know how much alcohol is in your drink. In the [place] one drink equals one 12 oz bottle of beer (355 mL), one 5 oz glass of wine (148 mL), or one 1 oz glass of hard liquor (44 mL). Lifestyle  Work with your health care provider to maintain a healthy body weight or to lose weight. Ask what an ideal weight is for you. Get at least 30 minutes of exercise that causes your heart to beat faster (aerobic exercise) most days of the week. Activities may include walking, swimming, or biking. Include exercise to strengthen your muscles (resistance exercise), such as Pilates or lifting weights, as part of your weekly exercise routine. Try to do these types of exercises for 30 minutes at least 3 days a week. Do not use any products that contain nicotine or tobacco. These products include cigarettes, chewing tobacco, and vaping devices, such as e-cigarettes. If you need help quitting, ask your health care provider. Monitor your blood pressure at home as told by your health care provider. Keep all follow-up visits. This is important. Medicines Take over-the-counter and prescription medicines only as told by your health care provider. Follow directions carefully. Blood  pressure medicines must be taken as prescribed. Do not skip doses of blood pressure medicine. Doing this puts you at risk for problems and can make the medicine less effective. Ask your health care provider about side effects or reactions to medicines that you should watch for. Contact a health care provider if you: Think you are having a reaction to a medicine you are taking. Have headaches that keep coming back (recurring). Feel dizzy. Have swelling in your ankles. Have trouble with your vision. Get help right away if you: Develop a severe headache or confusion. Have unusual weakness or numbness. Feel faint. Have severe pain in your chest or abdomen. Vomit repeatedly. Have trouble breathing. These symptoms may be an emergency. Get help right away. Call 911. Do not wait to see if the symptoms will go away. Do not drive yourself to the hospital. Summary Hypertension is when the force of blood pumping through your arteries is too strong. If this condition is not controlled, it may put you at risk for serious complications. Your personal target blood pressure may vary depending on your medical conditions, your age, and other factors. For most people, a normal blood pressure is less than 120/80. Hypertension is treated with lifestyle changes, medicines, or a combination of both. Lifestyle changes include losing weight, eating a healthy,  low-sodium diet, exercising more, and limiting alcohol. This information is not intended to replace advice given to you by your health care provider. Make sure you discuss any questions you have with your health care provider. Document Revised: 06/24/2021 Document Reviewed: 06/24/2021 Elsevier Patient Education  2024 [employer].

## 2024-03-21 ENCOUNTER — Other Ambulatory Visit: Payer: Self-pay | Admitting: Family Medicine

## 2024-03-21 DIAGNOSIS — I10 Essential (primary) hypertension: Secondary | ICD-10-CM

## 2024-03-24 ENCOUNTER — Other Ambulatory Visit (HOSPITAL_COMMUNITY): Payer: Self-pay

## 2024-03-24 ENCOUNTER — Telehealth: Payer: Self-pay

## 2024-03-24 NOTE — Telephone Encounter (Signed)
 Pt ready for scheduling for PROLIA  on or after : 04/26/24  Option# 1: Buy/Bill (Office supplied medication)  Out-of-pocket cost due at time of clinic visit: $357  Number of injection/visits approved: 2  Primary: [employer] Prolia  co-insurance: 20% Admin fee co-insurance: 20%  Secondary: --- Prolia  co-insurance:  Admin fee co-insurance:   Medical Benefit Details: Date Benefits were checked: 03/24/24 Deductible: NO/ Coinsurance: 20%/ Admin Fee: 20%  Prior Auth: APPROVED PA# [id number] Expiration Date: 10/15/23-10/14/24   # of doses approved: 2 ----------------------------------------------------------------------- Option# 2- Med Obtained from pharmacy:  Pharmacy benefit: Copay $645.79 (Paid to pharmacy) Admin Fee: 20% (Pay at clinic)  Prior Auth: N/A PA# Expiration Date:   # of doses approved:   If patient wants fill through the pharmacy benefit please send prescription to: [facility], and include estimated need by date in rx notes. Pharmacy will ship medication directly to the office.  Patient NOT eligible for Prolia  Copay Card. Copay Card can make patient's cost as little as $25. Link to apply: [email or web address]://[email or web address]  ** This summary of benefits is an estimation of the patient's out-of-pocket cost. Exact cost may very based on individual plan coverage.

## 2024-03-24 NOTE — Telephone Encounter (Signed)
 Prolia  VOB initiated via [email or web address]  Next Prolia  inj DUE: 04/26/24

## 2024-03-24 NOTE — Telephone Encounter (Signed)
 [name]

## 2024-03-27 ENCOUNTER — Telehealth: Payer: Self-pay | Admitting: *Deleted

## 2024-03-27 NOTE — Telephone Encounter (Signed)
 Left partial message to schedule. Pt is due on or around 04/27/24 and copay will be $357.

## 2024-04-10 ENCOUNTER — Other Ambulatory Visit: Payer: Self-pay | Admitting: Family Medicine

## 2024-04-10 DIAGNOSIS — E782 Mixed hyperlipidemia: Secondary | ICD-10-CM

## 2024-04-27 ENCOUNTER — Ambulatory Visit (INDEPENDENT_AMBULATORY_CARE_PROVIDER_SITE_OTHER): Admitting: *Deleted

## 2024-04-27 DIAGNOSIS — M810 Age-related osteoporosis without current pathological fracture: Secondary | ICD-10-CM

## 2024-04-27 MED ORDER — DENOSUMAB 60 MG/ML ~~LOC~~ SOSY: 60.0000 mg | PREFILLED_SYRINGE | SUBCUTANEOUS | Status: AC

## 2024-04-27 NOTE — Progress Notes (Signed)
 Patient here for Prolia  injection per physicians orders  Prolia  60 mg SQ , was administered left arm today. Patient tolerated injection.  Patient next injection due: 6 months, appt made:  No- will schedule in 5 months after benefits are ran again  Initial injection: no  Did Prolia  come from pharmacy (if yes please select patient supplied): no  Cam placed for next injection: yes

## 2024-04-27 NOTE — Progress Notes (Deleted)
 Complete physical exam  Patient: Michelle Jacobs   DOB: 23-Feb-1946   77 y.o. Female  MRN: [id number]  Subjective:    No chief complaint on file.   Michelle Jacobs is a 78 y.o. female who presents today for a complete physical exam. She reports consuming a {diet types:17450} diet. {types:19826} She generally feels {DESC; WELL/FAIRLY WELL/POORLY:18703}. She reports sleeping {DESC; WELL/FAIRLY WELL/POORLY:18703}. She {does/does not:200015} have additional problems to discuss today.    Most recent fall risk assessment:    08/16/2023    9:55 AM  Fall Risk   Falls in the past year? 0  Number falls in past yr: 0  Injury with Fall? 0  Risk for fall due to : No Fall Risks  Follow up Falls evaluation completed     Most recent depression screenings:    08/16/2023    9:55 AM 11/10/2022   11:13 AM  PHQ 2/9 Scores  PHQ - 2 Score 0 0  PHQ- 9 Score  0    {VISON DENTAL STD PSA (Optional):27386}  {History (Optional):23778}  Patient Care Team: Michelle Harlene LABOR, MD as PCP - General (Family Medicine)   Outpatient Medications Prior to Visit  Medication Sig   atorvastatin  (LIPITOR) 10 MG tablet TAKE 1 TABLET DAILY   Cholecalciferol (VITAMIN D3) 125 MCG (5000 UT) CAPS Take by mouth. 1 cap 4 times a week.   denosumab  (PROLIA ) 60 MG/ML SOSY injection Inject 60 mg into the skin every 6 (six) months. Inject at MD office.  Pt appt is 10/28/23. 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   levothyroxine  (SYNTHROID ) 137 MCG tablet TAKE 1 TABLET DAILY BEFORE BREAKFAST   lisinopril  (ZESTRIL ) 10 MG tablet TAKE 1 TABLET DAILY   Omega-3 Fatty Acids (FISH OIL) 1000 MG CAPS Take by mouth.   Facility-Administered Medications Prior to Visit  Medication Dose Route Frequency Provider   denosumab  (PROLIA ) injection 60 mg  60 mg Subcutaneous Q6 months Michelle Harlene LABOR, MD    ROS        Objective:     There were no vitals taken for this  visit. {Vitals History (Optional):23777}  Physical Exam   No results found for any visits on 04/27/24. {Show previous labs (optional):23779}    Assessment & Plan:    Routine Health Maintenance and Physical Exam  Immunization History  Administered Date(s) Administered   Fluad  Quad(high Dose 65+) 05/24/2019, 05/20/2020, 07/15/2021, 05/12/2022   Fluad  Trivalent(High Dose 65+) 05/25/2023   INFLUENZA, HIGH DOSE SEASONAL PF 07/20/2014, 09/25/2016, 05/25/2017, 06/03/2018   Influenza Split 05/30/2012   Influenza,inj,Quad PF,6+ Mos 05/15/2013, 09/23/2015   PFIZER Comirnaty (Gray Top)Covid-19 Tri-Sucrose Vaccine 12/20/2020   PFIZER(Purple Top)SARS-COV-2 Vaccination 10/09/2019, 11/03/2019, 05/28/2020   Pfizer Covid-19 Vaccine Bivalent Booster 68yrs & up 06/20/2021   Pfizer(Comirnaty )Fall Seasonal Vaccine 12 years and older 06/23/2022, 07/06/2023   Pneumococcal Conjugate-13 07/30/2014   Pneumococcal Polysaccharide-23 09/23/2015   Respiratory Syncytial Virus Vaccine ,Recomb Aduvanted(Arexvy ) 09/03/2022   Tdap 09/03/2011   Zoster Recombinant(Shingrix ) 06/03/2018, 05/24/2019, 08/01/2019   Zoster, Live 03/28/2013    Health Maintenance  Topic Date Due   Medicare Annual Wellness (AWV)  02/12/2022   INFLUENZA VACCINE  03/31/2024   DTaP/Tdap/Td (2 - Td or Tdap) 08/15/2024 (Originally 09/02/2021)   COVID-19 Vaccine (8 - 2024-25 season) 08/30/2024 (Originally 01/03/2024)   Pneumococcal Vaccine: 50+ Years  Completed   DEXA SCAN  Completed   Hepatitis C Screening  Completed   Zoster Vaccines- Shingrix   Completed   HPV VACCINES  Aged Out   Meningococcal B Vaccine  Aged Out   Colonoscopy  Discontinued    Discussed health benefits of physical activity, and encouraged her to engage in regular exercise appropriate for her age and condition.  Problem List Items Addressed This Visit   None  No follow-ups on file.     Michelle Jacobs, CMA

## 2024-06-14 ENCOUNTER — Other Ambulatory Visit (HOSPITAL_BASED_OUTPATIENT_CLINIC_OR_DEPARTMENT_OTHER): Payer: Self-pay

## 2024-06-14 MED ORDER — FLUZONE HIGH-DOSE 0.5 ML IM SUSY
0.5000 mL | PREFILLED_SYRINGE | Freq: Once | INTRAMUSCULAR | 0 refills | Status: AC
  Filled 2024-06-14: qty 0.5, 1d supply, fill #0

## 2024-07-19 ENCOUNTER — Other Ambulatory Visit (HOSPITAL_BASED_OUTPATIENT_CLINIC_OR_DEPARTMENT_OTHER): Payer: Self-pay

## 2024-07-19 MED ORDER — COMIRNATY 30 MCG/0.3ML IM SUSY
0.3000 mL | PREFILLED_SYRINGE | Freq: Once | INTRAMUSCULAR | 0 refills | Status: AC
  Filled 2024-07-19: qty 0.3, 1d supply, fill #0

## 2024-09-10 ENCOUNTER — Other Ambulatory Visit: Payer: Self-pay | Admitting: Family Medicine

## 2024-09-25 ENCOUNTER — Other Ambulatory Visit

## 2024-09-28 ENCOUNTER — Other Ambulatory Visit

## 2024-10-02 ENCOUNTER — Telehealth: Payer: Self-pay

## 2024-10-02 ENCOUNTER — Encounter: Admitting: Family Medicine

## 2024-10-02 ENCOUNTER — Other Ambulatory Visit (HOSPITAL_COMMUNITY): Payer: Self-pay

## 2024-10-02 NOTE — Telephone Encounter (Signed)
 Prolia  VOB initiated via [email or web address]  Next Prolia  inj DUE: 10/28/24

## 2024-10-04 NOTE — Telephone Encounter (Signed)
 Michelle Jacobs

## 2024-10-04 NOTE — Telephone Encounter (Signed)
 MEDICAL PA SUBMITTED VIA [employer].  Authorization Number : [id number]        APPROVED

## 2024-10-05 ENCOUNTER — Other Ambulatory Visit (HOSPITAL_COMMUNITY): Payer: Self-pay

## 2024-10-05 NOTE — Telephone Encounter (Signed)
 Pt ready for scheduling for PROLIA  on or after : 10/28/24  Option# 1: Buy/Bill (Office supplied medication)  Out-of-pocket cost due at time of clinic visit: $377  Number of injection/visits approved: 2  Primary: [employer] Prolia  co-insurance: 20% Admin fee co-insurance: 20%  Secondary: --- Prolia  co-insurance:  Admin fee co-insurance:   Medical Benefit Details: Date Benefits were checked: 10/02/24 Deductible: NO/ Coinsurance: 20%/ Admin Fee: 20%  Prior Auth: APPROVED PA# [id number] Expiration Date: 10/15/24-10/15/25   # of doses approved: 2 ----------------------------------------------------------------------- Option# 2- Med Obtained from pharmacy:  Pharmacy benefit: Copay $--- (Paid to pharmacy) Admin Fee: --- (Pay at clinic)  Prior Auth: PLAN EXCLUSION PA# Expiration Date:   # of doses approved:   If patient wants fill through the pharmacy benefit please send prescription to: ---, and include estimated need by date in rx notes. Pharmacy will ship medication directly to the office.  Patient NOT eligible for Prolia  Copay Card. Copay Card can make patient's cost as little as $25. Link to apply: [email or web address]  ** This summary of benefits is an estimation of the patient's out-of-pocket cost. Exact cost may very based on individual plan coverage.

## 2024-12-25 ENCOUNTER — Encounter: Admitting: Student
